# Patient Record
Sex: Female | Born: 1990 | Race: Black or African American | Hispanic: No | Marital: Single | Smoking: Never smoker
Health system: Southern US, Community
[De-identification: ages and names within clinical notes are randomized; demographics above are authoritative.]

## PROBLEM LIST (undated history)

## (undated) DIAGNOSIS — I739 Peripheral vascular disease, unspecified: Secondary | ICD-10-CM

## (undated) DIAGNOSIS — F79 Unspecified intellectual disabilities: Secondary | ICD-10-CM

## (undated) DIAGNOSIS — Z975 Presence of (intrauterine) contraceptive device: Secondary | ICD-10-CM

## (undated) DIAGNOSIS — R569 Unspecified convulsions: Secondary | ICD-10-CM

## (undated) DIAGNOSIS — F39 Unspecified mood [affective] disorder: Secondary | ICD-10-CM

## (undated) DIAGNOSIS — Z993 Dependence on wheelchair: Secondary | ICD-10-CM

## (undated) DIAGNOSIS — N920 Excessive and frequent menstruation with regular cycle: Secondary | ICD-10-CM

## (undated) DIAGNOSIS — G809 Cerebral palsy, unspecified: Secondary | ICD-10-CM

## (undated) DIAGNOSIS — D649 Anemia, unspecified: Secondary | ICD-10-CM

## (undated) DIAGNOSIS — Z7409 Other reduced mobility: Secondary | ICD-10-CM

## (undated) HISTORY — PX: EYE SURGERY: SHX253

## (undated) HISTORY — DX: Anemia, unspecified: D64.9

## (undated) HISTORY — DX: Presence of (intrauterine) contraceptive device: Z97.5

## (undated) HISTORY — PX: LEG SURGERY: SHX1003

---
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
## 2008-05-04 ENCOUNTER — Ambulatory Visit: Admission: RE | Admit: 2008-05-04 | Discharge: 2008-05-04 | Payer: Self-pay | Admitting: Family Medicine

## 2008-05-04 ENCOUNTER — Ambulatory Visit: Payer: Self-pay | Admitting: Surgery

## 2008-08-12 ENCOUNTER — Encounter: Admission: RE | Admit: 2008-08-12 | Discharge: 2008-08-26 | Payer: Self-pay | Admitting: Physician Assistant

## 2008-08-30 ENCOUNTER — Encounter: Admission: RE | Admit: 2008-08-30 | Discharge: 2008-09-16 | Payer: Self-pay | Admitting: Physician Assistant

## 2008-09-23 ENCOUNTER — Encounter (INDEPENDENT_AMBULATORY_CARE_PROVIDER_SITE_OTHER): Payer: Self-pay | Admitting: Family Medicine

## 2008-09-23 ENCOUNTER — Ambulatory Visit: Payer: Self-pay | Admitting: Vascular Surgery

## 2008-09-23 ENCOUNTER — Ambulatory Visit: Admission: RE | Admit: 2008-09-23 | Discharge: 2008-09-23 | Payer: Self-pay | Admitting: Family Medicine

## 2008-12-28 ENCOUNTER — Emergency Department (HOSPITAL_COMMUNITY): Admission: EM | Admit: 2008-12-28 | Discharge: 2008-12-28 | Payer: Self-pay | Admitting: Emergency Medicine

## 2009-03-28 ENCOUNTER — Encounter: Admission: RE | Admit: 2009-03-28 | Discharge: 2009-05-26 | Payer: Self-pay | Admitting: Physician Assistant

## 2010-06-25 ENCOUNTER — Emergency Department (HOSPITAL_COMMUNITY): Admission: EM | Admit: 2010-06-25 | Discharge: 2010-06-25 | Payer: Self-pay | Admitting: Emergency Medicine

## 2010-08-26 ENCOUNTER — Emergency Department (HOSPITAL_COMMUNITY)
Admission: EM | Admit: 2010-08-26 | Discharge: 2010-08-26 | Payer: Self-pay | Source: Home / Self Care | Admitting: Family Medicine

## 2010-10-18 ENCOUNTER — Ambulatory Visit (HOSPITAL_BASED_OUTPATIENT_CLINIC_OR_DEPARTMENT_OTHER)
Admission: RE | Admit: 2010-10-18 | Discharge: 2010-10-18 | Disposition: A | Discharge status: Home / Self Care | Payer: BC Managed Care – PPO | Attending: Oral and Maxillofacial Surgery | Admitting: Oral and Maxillofacial Surgery

## 2010-10-18 DIAGNOSIS — K006 Disturbances in tooth eruption: Secondary | ICD-10-CM | POA: Insufficient documentation

## 2010-10-18 DIAGNOSIS — F70 Mild intellectual disabilities: Secondary | ICD-10-CM | POA: Insufficient documentation

## 2010-10-18 HISTORY — PX: MULTIPLE TOOTH EXTRACTIONS: SHX2053

## 2010-10-18 LAB — POCT I-STAT, CHEM 8
BUN: 6 mg/dL (ref 6–23)
Calcium, Ion: 0.9 mmol/L — ABNORMAL LOW (ref 1.12–1.32)
Creatinine, Ser: 0.6 mg/dL (ref 0.4–1.2)
Glucose, Bld: 91 mg/dL (ref 70–99)
Sodium: 135 mEq/L (ref 135–145)

## 2010-10-24 NOTE — Op Note (Signed)
NAMESHENG, PRITZ           ACCOUNT NO.:  [id number]  MEDICAL RECORD NO.:  [id number]           PATIENT TYPE:  LOCATION:                                 FACILITY:  PHYSICIAN:  Lincoln Brigham, DDSDATE OF BIRTH:  1991/05/01  DATE OF PROCEDURE:  10/18/2010 DATE OF DISCHARGE:                              OPERATIVE REPORT   PREOPERATIVE DIAGNOSIS:  Mild mental retardation, class II malocclusion, nonfunctional tooth #1 and tooth #16, impacted tooth #17 and tooth #32.  POSTOPERATIVE DIAGNOSIS:  Mild mental retardation, class II malocclusion, nonfunctional 1 and 16, impacted #17 and #32.  PROCEDURE:  Extraction of teeth numbers 1, 5, 12, 16, 17, and 32.  INDICATIONS FOR PROCEDURE:  The patient is a 20 year old African American female originally referred to my office by Dr. Joycelyn Schmid who is her orthodontist.  The patient was referred for extraction of #1, 5, 12, 16, 17, and 32 in order to correct the patient's malocclusion. The patient has a severe class II dental malocclusion with significant overjet.  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 The patient also takes clonazepam for nervousness and anxiety.  The patient also takes glycopyrrolate for hypersalivation.  Due to the patient's medical history, it was deemed necessary that the patient be taken to the operating room to remove the aforementioned teeth.  PROCEDURE IN DETAIL:  The patient was seen in the preoperative area. The patient's questions were answered along with parents.  Consent was verified and updated along with the history and physical verified and updated.  Anesthesia at this point took the patient to the operating room where the patient was then nasally intubated and then turned over to myself.  The patient was prepped and draped in the usual  sterile fashion.  A moistened Ray-Tec was then placed as a third screen and then approximately 10.8 mL of 2% lidocaine with 1:100,000 epinephrine were then injected into the patient's upper right and upper left maxilla along with the lower right and lower left mandible.  In addition to the lidocaine, 0.5% Marcaine with 1:200,000 epinephrine was then used to provide long-term anesthesia to the region, right maxilla, left maxilla, right mandible, left mandible, and approximately 7.2 mL which used to anesthetize that region.  At this point, a 15 blade was then used to make a full-thickness mucoperiosteal flap with a distal buccal release in the region of tooth #17 and a drill was then used to remove bone and create a buccal trough.  At this point, dental elevators and a forceps was then used to delivery tooth #17 intact and then the extraction site was irrigated copiously along with curettage and bone file and then area was sutured with a 3-0 chromic gut suture.  This exact same procedure was repeated for tooth #32.  Our attention was then turned to #16 where a 15 blade was then used to make a full-thickness mucoperiosteal incision with a distal release and a dental elevator was then used to elevate and luxate  the tooth out of the socket.  This was repeated for tooth #1 as well.  Finally, teeth numbers 5 and 12 were removed with straight elevator and a forceps.  All roots were intact.  There was minimal hemorrhage.  No sutures were placed at teeth numbers 1, 16, 5, and 12.  Good hemostasis was achieved.  The patient had throat pack removed after copious irrigation.  All counts were correct x2.  The patient was then returned to the care of Anesthesia where the patient was extubated in a stable fashion.  The patient was then taken to the postoperative area in stable condition.  FINDINGS:  Impacted numbers 17 and 32, nonfunctional #1 and 16, class II dental malocclusion requiring extraction of  premolars #5 and #12.  SPECIMENS:  Teeth numbers 1, 5, 12, 16, 17, 32.  These specimens were discarded and not sent to Pathology.          ______________________________ Lincoln Brigham, DDS     CD/MEDQ  D:  10/18/2010  T:  10/19/2010  Job:  [id number]  Electronically Signed by Lincoln Brigham DDS on 10/24/2010 05:43:13 PM

## 2010-10-24 NOTE — Discharge Summary (Signed)
  Nancy [name], Nancy [name]           ACCOUNT NO.:  [id number]  MEDICAL RECORD NO.:  [id number]          PATIENT TYPE:  AMB  LOCATION:  DSC                          FACILITY:  [facility]  PHYSICIAN:  Lincoln Brigham, DDSDATE OF BIRTH:  1991-06-24  DATE OF ADMISSION: DATE OF DISCHARGE:                              DISCHARGE SUMMARY   HISTORY:  The patient is a 20 year old African American female with a medical history of mild mental retardation and dental malocclusion requiring extraction of #1, #5, #12, #16, #17 and #32 for correction of her dental malocclusion.  The patient was taken to the operating room on October 18, 2010, for extraction of the aforementioned teeth, teeth #1, #5, #12, #16, #17 and #32.  The patient did well with surgery and was stable following the procedure.  The patient was discharged home.  FINAL DIAGNOSIS: 1. Mild mental retardation. 2. Class II dental malocclusion, nonfunctional #1 and #16 and impacted     #17 and #32.          ______________________________ Lincoln Brigham, DDS     CD/MEDQ  D:  10/18/2010  T:  10/19/2010  Job:  [id number]  Electronically Signed by Lincoln Brigham DDS on 10/24/2010 05:43:10 PM

## 2010-11-06 LAB — POCT URINALYSIS DIPSTICK
Hgb urine dipstick: NEGATIVE
Nitrite: NEGATIVE
Urobilinogen, UA: 2 mg/dL — ABNORMAL HIGH (ref 0.0–1.0)
pH: 7 (ref 5.0–8.0)

## 2010-11-08 LAB — URINALYSIS, ROUTINE W REFLEX MICROSCOPIC
Glucose, UA: NEGATIVE mg/dL
Hgb urine dipstick: NEGATIVE
Ketones, ur: 15 mg/dL — AB

## 2010-11-08 LAB — GC/CHLAMYDIA PROBE AMP, URINE
Chlamydia, Swab/Urine, PCR: NEGATIVE
GC Probe Amp, Urine: NEGATIVE

## 2010-11-08 LAB — POCT URINALYSIS DIPSTICK
Ketones, ur: 40 mg/dL — AB
Protein, ur: NEGATIVE mg/dL
pH: 6.5 (ref 5.0–8.0)

## 2010-11-08 LAB — URINE CULTURE
Culture  Setup Time: 201110302104
Culture: NO GROWTH

## 2011-01-12 NOTE — H&P (Signed)
NAMEBRIZA, BARK           ACCOUNT NO.:  [id number]   MEDICAL RECORD NO.:  [id number]          PATIENT TYPE:  INP   LOCATION:  3708                         FACILITY:  [facility]   PHYSICIAN:  Ruthe Mannan, M.D.       DATE OF BIRTH:  11/07/1990   DATE OF ADMISSION:  10/02/2006  DATE OF DISCHARGE:                              HISTORY & PHYSICAL   CHIEF COMPLAINT:  Bilateral leg edema.   HISTORY OF PRESENT ILLNESS:  Zannah is a 20 year old African-American  female with a past medical history significant for cerebral palsy,  mental retardation, and questionable mood disorder, who presented to the  ED with a one month history of bilateral leg edema.  Her father reports  the patient was taking Depo-Provera to regulate her menstrual cycle when  she started developing mood swings two months ago.  She was then  switched to Lybrel for oral contraception when she began having right  leg swelling.  X-rays were done, which showed no fracture and Ace  bandage was applied.  Three days later, edema started in the left leg as  well.  She returned to the PCP last week and was told to stop her oral  contraceptive pills and go back to Depo-Provera.  She was then referred  to the gynecologist who started her back on Depo-Provera.  She was also  placed on hydrochlorothiazide.  Swelling persisted until today when she  presented to the gynecologist and who ordered an ultrasound Doppler  which showed DVTs in the common femoral vein bilaterally, common  profunda vein, and popliteal vein.  She had complaints of no shortness  of breath or chest pain.   PAST MEDICAL HISTORY:  1. Cerebral palsy.  2. Mental retardation.  3. Mood disorder, questionable.   PAST SURGICAL HISTORY:  Corrective surgery on leg during childhood.  Eye  surgery.   FAMILY HISTORY:  No family history of DVTs or PEs.  Grandmother and  grandfather had CAD and diabetes.   SOCIAL HISTORY:  The patient lives in mom, 73 year old  brother, and dad  __________ [place].  She does not smoke.   ALLERGIES:  NO KNOWN DRUG ALLERGIES.   MEDICATIONS:  1. Clonazepam 1 tab q.a.m. and half tab nightly.  2. Hydrochlorothiazide.  3. Amdry-C one tab daily.  4. Lybrel one tab daily.   PHYSICAL EXAMINATION:  VITAL SIGNS:  Temperature is 97.2, pulse is  elevated at 147, respiratory rate is 24, blood pressure is 120/81.  Oxygen saturation 100% on room air.  GENERAL:  She is alert, oriented x3, in no acute distress.  RESPIRATORY:  Clear to auscultation bilaterally.  No increased work of  breathing, nontender to palpation.  CARDIOVASCULAR:  She is tachycardic with regular rhythm.  ABDOMEN:  Soft, nontender, with positive bowel sounds.  EXTREMITIES:  Shows 1+ pitting edema in bilateral lower extremities, 2+  pulses.  Feet are cold to touch and are not cyanotic.  She has no  palpable clots and with her contractures it is not possible to check  Homans sign.   REVIEW OF SYSTEMS:  In general showed no fevers or altered mental  status.  RESPIRATORY:  No shortness of breath.  CVS:  No chest pain.  EXTREMITIES:  No cyanosis and no leg pain.   LABS:  Her PTT is 32.  Her PT is 15.3.  Her INR is 1.2.  Her white count  is 9.0.  Her hemoglobin is 13.2 and her hematocrit is 39.2.  Platelets  are elevated 619.  Her sodium is 136.  Her potassium is 3.7.  Her  chloride is 99.  Her CO2 is 26.  Her BUN is 7.  Her creatinine is 0.52.  Her glucose is 114.  Calcium is 9.5, albumin is 3.3.  AST is 20, ALT is  13.  CT angiogram negative for PE.   ASSESSMENT AND PLAN:  A 20 year old with history of cerebral palsy,  mental retardation, admitted for deep venous thrombosis.  1. Deep venous thrombosis likely secondary to oral contraceptives with      sedentary/wheelchair-bound lifestyle.  Will admit for deep venous      thrombosis treatment, Lovenox and Coumadin, 5 mg now then dosed per      pharmacy.  Will also check CT angiogram to rule out  pulmonary      embolus, as the patient is tachycardic, but denies chest pain or      shortness of breath.  Will hold oral contraceptives.  2. Cerebral palsy, stable.  The patient is very functional and active      at home in full activities.  Will continue clonazepam and Amdry-C.  3. FENGI regular diet.  Saline lock IV fluids.  4. Prophylaxis.  The patient on treatment dose of Lovenox.           ______________________________  Ruthe Mannan, M.D.     TA/MEDQ  D:  10/02/2006  T:  10/03/2006  Job:  [id number]

## 2011-01-12 NOTE — Procedures (Signed)
HISTORY:  This is a 20 year old patient who has a history of cerebral palsy  being evaluated for possible seizures. This is a routine EEG. No skull  defects are noted.   MEDICATIONS:  Depakote.   EEG CLASSIFICATION:  Essentially normal awake/technically difficult.   DESCRIPTION:  According to the background rhythms, this recording consists  of a somewhat poorly modulated low  amplitude, 8 to 9 hertz activity that  appears to be reactive to eye opening and closure. As the record progresses,  the EEG study is filled with head movement and muscle artifact that obscures  the background activities frequently. Photic stimulation is performed and  appears to result in a very minimal photic driving response.  Hyperventilation was not performed. At no time during the recording did  there appear to be evidence of actual spike or spike wave discharges or  evidence of focal slowing. EKG monitor shows no evidence of cardiac rhythm  abnormalities with a heart rate of 102.   IMPRESSION:  This is an essentially normal EEG recording in the waking  state. This is a technically difficult study but no clear evidence of  epileptiform discharges are seen at any time.      Marlan Palau, M.D.  Electronically Signed     ZOX:WRUE  D:  04/12/2005 20:41:21  T:  04/13/2005 45:40:98  Job #:  [id number]

## 2011-01-12 NOTE — Discharge Summary (Signed)
Nancy [name], Nancy [name]           ACCOUNT NO.:  [id number]   MEDICAL RECORD NO.:  [id number]          PATIENT TYPE:  INP   LOCATION:  3708                         FACILITY:  [facility]   PHYSICIAN:  Lupita Raider, M.D.   DATE OF BIRTH:  [birth year]   DATE OF ADMISSION:  10/02/2006  DATE OF DISCHARGE:  10/05/2006                               DISCHARGE SUMMARY   DISCHARGE DIAGNOSES:  1. Bilateral deep vein thrombosis secondary to oral contraceptive pill      use and immobility.  2. Resolving tachycardia, likely related to deep vein thromboses.   MEDICATIONS:  1. Coumadin 2.5 mg p.o. for the next 2 days, until she can follow up      with [facility].  2. Lovenox 50 mg subcutaneous injection b.i.d., until INR at goal      between 2 and 3.  3. Klonopin 1 mg p.o. q.a.m. and 0.5 mg p.o. q.h.s.  4. Amdry-C one tablet daily for secretions.   LABORATORY STUDIES:  Cardiac enzymes negative x3. TSH 1.466, T3 2.9, T4  1.14.  INR (on admission) 1.2, INR (at discharge) 1.8.  CBC:  Hemoglobin  12.8, white blood cell count 7.8, platelet count 485.  Iron studies  within normal limits. Ferritin within normal limits.  LFTs within normal  limits.   FOLLOWUP:  The patient is to follow up with [facility]  on Monday, October 07, 2006 at 10:30 a.m. for an INR check.   FOLLOW-UP ISSUES:  1. Goal INR between 2 and 3.  Will need to continue to bridge her      Lovenox until she is therapeutic.  2. Tachycardia.  Likely this is related to her deep vein thromboses,      but she has a strong family history of hyperthyroidism.  Would      follow thyroid studies closely and check back to old records to see      if this is a chronic issue.  3. Birth Control.  The patient is no longer a candidate for hormonal      contraceptive pill methods.   HOSPITAL COURSE:  A 20 year old African-American female with high  functioning CP and MR, who was recently switched from DepoProvera to  oral  contraceptive pills; subsequently developed leg swelling secondary  to bilateral extensive DVTs.  She was admitted for heparin treatment  while bridging to Coumadin, for which she tolerated well.  She was found  to be tachycardic throughout this admission, and this could be related  to the DVTs but could also be her baseline.  She has a strong family  history of hyperthyroidism, but thyroid function tests were normal at  this visit.  That is not to say they are not fluctuating and should be  checked frequently.   There is teaching done regarding Lovenox administration and Coumadin  diet.  The patient and her family were warned that Coumadin is a  category X drug for pregnancy, and she is not to become pregnant under  any circumstances.  They both reassured me that she is not sexually  active.  She, unfortunately now is no  longer a candidate for hormonal  birth control; so a copper IUD or barrier methods are probably her only  options at this point.  She has an appointment in 2 days with [facility] for an INR check and further management of her anticoagulation.   Home health will be set up to come to her house starting on Tuesdays at  12, and be directed per the primary care physician at [facility] as  far as how frequently they would like to have her INR checked.           ______________________________  Lupita Raider, M.D.     KS/MEDQ  D:  10/05/2006  T:  10/05/2006  Job:  [id number]   cc:   Olena Leatherwood [facility]

## 2012-02-18 DIAGNOSIS — Z0000 Encounter for general adult medical examination without abnormal findings: Secondary | ICD-10-CM | POA: Diagnosis not present

## 2012-04-29 DIAGNOSIS — D649 Anemia, unspecified: Secondary | ICD-10-CM | POA: Diagnosis not present

## 2012-06-03 DIAGNOSIS — D649 Anemia, unspecified: Secondary | ICD-10-CM | POA: Diagnosis not present

## 2012-06-03 DIAGNOSIS — N920 Excessive and frequent menstruation with regular cycle: Secondary | ICD-10-CM | POA: Diagnosis not present

## 2012-07-16 DIAGNOSIS — Z3009 Encounter for other general counseling and advice on contraception: Secondary | ICD-10-CM | POA: Diagnosis not present

## 2012-09-26 DIAGNOSIS — I2510 Atherosclerotic heart disease of native coronary artery without angina pectoris: Secondary | ICD-10-CM | POA: Diagnosis not present

## 2012-09-26 DIAGNOSIS — D649 Anemia, unspecified: Secondary | ICD-10-CM | POA: Diagnosis not present

## 2012-10-25 DIAGNOSIS — Z975 Presence of (intrauterine) contraceptive device: Secondary | ICD-10-CM

## 2012-10-25 HISTORY — DX: Presence of (intrauterine) contraceptive device: Z97.5

## 2012-11-03 ENCOUNTER — Encounter (HOSPITAL_COMMUNITY): Payer: Self-pay | Admitting: *Deleted

## 2012-11-06 ENCOUNTER — Encounter (HOSPITAL_COMMUNITY)
Admission: RE | Admit: 2012-11-06 | Discharge: 2012-11-06 | Disposition: A | Discharge status: Home / Self Care | Payer: BC Managed Care – PPO | Source: Ambulatory Visit | Attending: Obstetrics and Gynecology | Admitting: Obstetrics and Gynecology

## 2012-11-06 ENCOUNTER — Encounter (HOSPITAL_COMMUNITY): Payer: Self-pay

## 2012-11-06 HISTORY — DX: Unspecified convulsions: R56.9

## 2012-11-06 LAB — CBC
HCT: 42.7 % (ref 36.0–46.0)
Hemoglobin: 14.1 g/dL (ref 12.0–15.0)
MCHC: 33 g/dL (ref 30.0–36.0)
MCV: 93.4 fL (ref 78.0–100.0)
Platelets: 261 10*3/uL (ref 150–400)
RBC: 4.57 MIL/uL (ref 3.87–5.11)
WBC: 7.4 10*3/uL (ref 4.0–10.5)

## 2012-11-06 LAB — BASIC METABOLIC PANEL
BUN: 7 mg/dL (ref 6–23)
Calcium: 9.7 mg/dL (ref 8.4–10.5)
Creatinine, Ser: 0.4 mg/dL — ABNORMAL LOW (ref 0.50–1.10)
GFR calc Af Amer: >90 mL/min (ref >90)
GFR calc non Af Amer: >90 mL/min (ref >90)

## 2012-11-06 NOTE — Patient Instructions (Addendum)
Your procedure is scheduled on:11/12/12  Enter through the Main Entrance at :7am Pick up desk phone and dial [phone] and inform us of your arrival.  Please call [phone] if you have any problems the morning of surgery.  Remember: Do not eat or drink after midnight:Tuesday   Take these meds the morning of surgery with a sip of water: usual morning meds  DO NOT wear jewelry, eye make-up, lipstick,body lotion, or dark fingernail polish. Do not shave for 48 hours prior to surgery.  If you are to be admitted after surgery, leave suitcase in car until your room has been assigned. Patients discharged on the day of surgery will not be allowed to drive home.

## 2012-11-06 NOTE — Pre-Procedure Instructions (Signed)
Pt confined to wheel chair and unable to stand without assistance. Dr. Malen Gauze called in to estimate pt's wt at 104 lbs.

## 2012-11-07 NOTE — Pre-Procedure Instructions (Addendum)
During PAT appt, pt's caregiver from group home, Ms. Hatti,who was present with patient, told me that parents are aware that surgery consent form needs to be signed by one of the parents. I sent a note to be delivered by Ms. Hatti to pt's father that had my name and office phone number and instructions to call me and make arrangements to come to hospital and sign consent.  As of 1430 on 11/07/12, I have not heard from Mr. Roxan Hockey. I called Ms. Hatti to inform her and she said she would get in touch with Mr. Montee again. I have attempted to phone all available numbers listed for the parents to no avail.

## 2012-11-11 ENCOUNTER — Encounter (HOSPITAL_COMMUNITY): Payer: Self-pay | Admitting: Obstetrics and Gynecology

## 2012-11-11 DIAGNOSIS — Z7409 Other reduced mobility: Secondary | ICD-10-CM

## 2012-11-11 DIAGNOSIS — N920 Excessive and frequent menstruation with regular cycle: Secondary | ICD-10-CM

## 2012-11-11 DIAGNOSIS — Z993 Dependence on wheelchair: Secondary | ICD-10-CM

## 2012-11-11 HISTORY — DX: Excessive and frequent menstruation with regular cycle: N92.0

## 2012-11-11 HISTORY — DX: Other reduced mobility: Z74.09

## 2012-11-11 NOTE — H&P (Signed)
Nancy [name] is an 22 y.o. female G0P0 desires to decrease menses, states has heavy menses, bothered by difficulty caring for herself.  H/O CP, wheelchair bound making exam in office difficult/impossible.  Also MR, mood disorder, h/o B DVT- necessitating progesterone only contraception.  Pt unable to tolerate Depo.    Pertinent Gynecological History: Menses: flow is moderate and regular menses Bleeding: dysfunctional uterine bleeding Contraception: none DES exposure: unknown Blood transfusions: none Sexually transmitted diseases: no past history Previous GYN Procedures: N/A  Last pap: none OB History: G0, P0   Menstrual History:  No LMP recorded.    Past Medical History  Diagnosis Date  . CP (cerebral palsy)     with mild contracture  . MR (mental retardation)   . Mood disorder   . Peripheral vascular disease 10/02/2006    bilateral DVT lower legs  . Wheelchair bound     r/t CP  . Seizures     childhood    Past Surgical History  Procedure Laterality Date  . Multiple tooth extractions  10/18/10  . Leg surgery      as a child  . Eye surgery      as a child    Family History: CVA, DM 2, HTN  Social History:  reports that she has never smoked. She does not have any smokeless tobacco history on file. She reports that she does not drink alcohol or use illicit drugs.single  Allergies: No Known Allergies  No prescriptions prior to admission    Review of Systems  Constitutional: Negative.   HENT: Negative.   Eyes: Negative.   Respiratory: Negative.   Cardiovascular: Negative.   Gastrointestinal: Negative.   Genitourinary: Negative.   Musculoskeletal:       CP, wheelchair bound  Skin: Negative.   Neurological:       CP, wheelchair bound  Psychiatric/Behavioral: Negative.     Height 4\' 11"  (1.499 m), weight 49.896 kg (110 lb). Physical Exam  Constitutional: She is oriented to person, place, and time.  NAD  HENT:  Head: Normocephalic and atraumatic.   Cardiovascular: Normal rate and regular rhythm.   Respiratory: Effort normal and breath sounds normal. No respiratory distress. She has no wheezes.  GI: Soft. Bowel sounds are normal. There is no tenderness.  Musculoskeletal:  CP, wheelchair bound  Neurological: She is alert and oriented to person, place, and time.  Skin: Skin is warm and dry.  Psychiatric: She has a normal mood and affect. Her behavior is normal.    No results found for this or any previous visit (from the past 24 hour(s)).  No results found.  Assessment/Plan: 21yo G0P0  For Mirena IUD placement and EUA with pap.  D/w pt and caregiver r/b/a, wish to proceed.  Also pap.    BOVARD,Icela Glymph 11/11/2012, 9:22 PM

## 2012-11-12 ENCOUNTER — Encounter (HOSPITAL_COMMUNITY): Payer: Self-pay | Admitting: Anesthesiology

## 2012-11-12 ENCOUNTER — Ambulatory Visit (HOSPITAL_COMMUNITY): Payer: BC Managed Care – PPO | Admitting: Anesthesiology

## 2012-11-12 ENCOUNTER — Encounter (HOSPITAL_COMMUNITY)
Admission: RE | Disposition: A | Discharge status: Home Health | Payer: Self-pay | Source: Ambulatory Visit | Attending: Obstetrics and Gynecology

## 2012-11-12 ENCOUNTER — Ambulatory Visit (HOSPITAL_COMMUNITY)
Admission: RE | Admit: 2012-11-12 | Discharge: 2012-11-12 | Disposition: A | Discharge status: Home Health | Payer: BC Managed Care – PPO | Source: Ambulatory Visit | Attending: Obstetrics and Gynecology | Admitting: Obstetrics and Gynecology

## 2012-11-12 DIAGNOSIS — N920 Excessive and frequent menstruation with regular cycle: Secondary | ICD-10-CM | POA: Insufficient documentation

## 2012-11-12 DIAGNOSIS — Z7409 Other reduced mobility: Secondary | ICD-10-CM | POA: Diagnosis present

## 2012-11-12 DIAGNOSIS — Z86718 Personal history of other venous thrombosis and embolism: Secondary | ICD-10-CM | POA: Diagnosis not present

## 2012-11-12 DIAGNOSIS — Z30430 Encounter for insertion of intrauterine contraceptive device: Secondary | ICD-10-CM | POA: Diagnosis not present

## 2012-11-12 DIAGNOSIS — Z993 Dependence on wheelchair: Secondary | ICD-10-CM

## 2012-11-12 HISTORY — DX: Cerebral palsy, unspecified: G80.9

## 2012-11-12 HISTORY — DX: Unspecified intellectual disabilities: F79

## 2012-11-12 HISTORY — PX: INTRAUTERINE DEVICE (IUD) INSERTION: SHX5877

## 2012-11-12 HISTORY — DX: Excessive and frequent menstruation with regular cycle: N92.0

## 2012-11-12 HISTORY — DX: Unspecified mood (affective) disorder: F39

## 2012-11-12 HISTORY — DX: Dependence on wheelchair: Z99.3

## 2012-11-12 HISTORY — DX: Peripheral vascular disease, unspecified: I73.9

## 2012-11-12 HISTORY — DX: Other reduced mobility: Z74.09

## 2012-11-12 LAB — PREGNANCY, URINE: Preg Test, Ur: NEGATIVE

## 2012-11-12 SURGERY — INSERTION, INTRAUTERINE DEVICE
Anesthesia: Monitor Anesthesia Care | Site: Vagina | Wound class: Clean Contaminated

## 2012-11-12 MED ORDER — ONDANSETRON HCL 4 MG/2ML IJ SOLN
INTRAMUSCULAR | Status: DC | PRN
  Administered 2012-11-12: 4 mg via INTRAVENOUS

## 2012-11-12 MED ORDER — PROPOFOL 10 MG/ML IV EMUL
INTRAVENOUS | Status: AC
  Filled 2012-11-12: qty 20

## 2012-11-12 MED ORDER — KETOROLAC TROMETHAMINE 30 MG/ML IJ SOLN
INTRAMUSCULAR | Status: DC | PRN
  Administered 2012-11-12: 30 mg via INTRAVENOUS

## 2012-11-12 MED ORDER — KETOROLAC TROMETHAMINE 30 MG/ML IJ SOLN
INTRAMUSCULAR | Status: AC
  Filled 2012-11-12: qty 1

## 2012-11-12 MED ORDER — LACTATED RINGERS IV SOLN
INTRAVENOUS | Status: DC
  Administered 2012-11-12: 08:00:00 via INTRAVENOUS

## 2012-11-12 MED ORDER — MIDAZOLAM HCL 5 MG/5ML IJ SOLN
INTRAMUSCULAR | Status: DC | PRN
  Administered 2012-11-12 (×2): 1 mg via INTRAVENOUS

## 2012-11-12 MED ORDER — FENTANYL CITRATE 0.05 MG/ML IJ SOLN
INTRAMUSCULAR | Status: AC
  Filled 2012-11-12: qty 2

## 2012-11-12 MED ORDER — LIDOCAINE HCL (CARDIAC) 20 MG/ML IV SOLN
INTRAVENOUS | Status: AC
  Filled 2012-11-12: qty 5

## 2012-11-12 MED ORDER — MEPERIDINE HCL 25 MG/ML IJ SOLN: 6.2500 mg | INTRAMUSCULAR | Status: DC | PRN

## 2012-11-12 MED ORDER — FENTANYL CITRATE 0.05 MG/ML IJ SOLN: 25.0000 ug | INTRAMUSCULAR | Status: DC | PRN

## 2012-11-12 MED ORDER — FENTANYL CITRATE 0.05 MG/ML IJ SOLN
INTRAMUSCULAR | Status: DC | PRN
  Administered 2012-11-12 (×2): 12.5 ug via INTRAVENOUS

## 2012-11-12 MED ORDER — KETOROLAC TROMETHAMINE 30 MG/ML IJ SOLN: 15.0000 mg | Freq: Once | INTRAMUSCULAR | Status: DC | PRN

## 2012-11-12 MED ORDER — PROPOFOL 10 MG/ML IV EMUL
INTRAVENOUS | Status: DC | PRN
  Administered 2012-11-12 (×5): 10 mg via INTRAVENOUS
  Administered 2012-11-12 (×2): 20 mg via INTRAVENOUS
  Administered 2012-11-12 (×5): 10 mg via INTRAVENOUS

## 2012-11-12 MED ORDER — LIDOCAINE HCL (CARDIAC) 20 MG/ML IV SOLN
INTRAVENOUS | Status: DC | PRN
  Administered 2012-11-12 (×2): 20 mg via INTRAVENOUS

## 2012-11-12 MED ORDER — IBUPROFEN 600 MG PO TABS: 600.0000 mg | ORAL_TABLET | Freq: Three times a day (TID) | ORAL | Status: DC | PRN

## 2012-11-12 MED ORDER — LIDOCAINE HCL 1 % IJ SOLN
INTRAMUSCULAR | Status: DC | PRN
  Administered 2012-11-12: 10 mL

## 2012-11-12 MED ORDER — ONDANSETRON HCL 4 MG/2ML IJ SOLN
INTRAMUSCULAR | Status: AC
  Filled 2012-11-12: qty 2

## 2012-11-12 MED ORDER — ONDANSETRON HCL 4 MG/2ML IJ SOLN: 4.0000 mg | Freq: Once | INTRAMUSCULAR | Status: DC | PRN

## 2012-11-12 MED ORDER — LACTATED RINGERS IV SOLN: INTRAVENOUS | Status: DC

## 2012-11-12 MED ORDER — MIDAZOLAM HCL 2 MG/2ML IJ SOLN
INTRAMUSCULAR | Status: AC
  Filled 2012-11-12: qty 2

## 2012-11-12 SURGICAL SUPPLY — 12 items
CATH ROBINSON RED A/P 16FR (CATHETERS) ×2 IMPLANT
CLOTH BEACON ORANGE TIMEOUT ST (SAFETY) ×2 IMPLANT
CONTAINER PREFILL 10% NBF 60ML (FORM) ×4 IMPLANT
GLOVE BIO SURGEON STRL SZ 6.5 (GLOVE) ×2 IMPLANT
GOWN STRL REIN XL XLG (GOWN DISPOSABLE) ×4 IMPLANT
NDL SPNL 22GX3.5 QUINCKE BK (NEEDLE) ×1 IMPLANT
NEEDLE SPNL 22GX3.5 QUINCKE BK (NEEDLE) ×2 IMPLANT
PACK VAGINAL MINOR WOMEN LF (CUSTOM PROCEDURE TRAY) ×2 IMPLANT
PAD PREP 24X48 CUFFED NSTRL (MISCELLANEOUS) ×2 IMPLANT
SYR CONTROL 10ML LL (SYRINGE) ×2 IMPLANT
TOWEL OR 17X24 6PK STRL BLUE (TOWEL DISPOSABLE) ×4 IMPLANT
WATER STERILE IRR 1000ML POUR (IV SOLUTION) ×2 IMPLANT

## 2012-11-12 NOTE — Brief Op Note (Signed)
11/12/2012  9:04 AM  PATIENT:  Nancy [name]  22 y.o. female  PRE-OPERATIVE DIAGNOSIS:  Menorrhagia, Pt. in a wheelchair  POST-OPERATIVE DIAGNOSIS:  Menorrhagia, pt in a wheelchair  PROCEDURE:  Procedure(s): INTRAUTERINE DEVICE (IUD) INSERTION (N/A), EUA, Pap  SURGEON:  Surgeon(s) and Role:    * Jerusalem Brownstein Bovard, MD - Primary  ANESTHESIA:   IV sedation and paracervical block  EBL:  Total I/O In: 800 [I.V.:800] Out: 100 [Urine:100]  BLOOD ADMINISTERED:none  DRAINS: none   LOCAL MEDICATIONS USED:  LIDOCAINE 1%  SPECIMEN:  Source of Specimen:  Pap, UA, Ur Cx  DISPOSITION OF SPECIMEN:  PATHOLOGY  COUNTS:  YES  TOURNIQUET:  * No tourniquets in log *  DICTATION: .Other Dictation: Dictation Number [id number]  PLAN OF CARE: Discharge to home after PACU  PATIENT DISPOSITION:  PACU - hemodynamically stable.   Delay start of Pharmacological VTE agent (>24hrs) due to surgical blood loss or risk of bleeding: not applicable

## 2012-11-12 NOTE — Anesthesia Postprocedure Evaluation (Signed)
  Anesthesia Post-op Note  Patient: Nancy [name]  Procedure(s) Performed: Procedure(s): INTRAUTERINE DEVICE (IUD) INSERTION (N/A)  Patient is awake and responsive. Pain and nausea are reasonably well controlled. Vital signs are stable and clinically acceptable. Oxygen saturation is clinically acceptable. There are no apparent anesthetic complications at this time. Patient is ready for discharge.

## 2012-11-12 NOTE — Transfer of Care (Signed)
Immediate Anesthesia Transfer of Care Note  Patient: Nancy [name]  Procedure(s) Performed: Procedure(s): INTRAUTERINE DEVICE (IUD) INSERTION (N/A)  Patient Location: PACU  Anesthesia Type:MAC  Level of Consciousness: awake, oriented and patient cooperative  Airway & Oxygen Therapy: Patient Spontanous Breathing  Post-op Assessment: Report given to PACU RN  Post vital signs: Reviewed and stable  Complications: No apparent anesthesia complications

## 2012-11-12 NOTE — Anesthesia Preprocedure Evaluation (Signed)
Anesthesia Evaluation  Patient identified by MRN, date of birth, ID band Patient awake    Reviewed: Allergy & Precautions, H&P , Patient's Chart, lab work & pertinent test results  Airway Mallampati: III TM Distance: >3 FB Neck ROM: full    Dental  (+) Teeth Intact and Poor Dentition   Pulmonary neg pulmonary ROS,  breath sounds clear to auscultation  Pulmonary exam normal       Cardiovascular negative cardio ROS      Neuro/Psych Seizures -, Well Controlled,  negative psych ROS   GI/Hepatic negative GI ROS, Neg liver ROS,   Endo/Other  negative endocrine ROS  Renal/GU negative Renal ROS  negative genitourinary   Musculoskeletal negative musculoskeletal ROS (+)   Abdominal Normal abdominal exam  (+)   Peds negative pediatric ROS (+)  Hematology negative hematology ROS (+)   Anesthesia Other Findings   Reproductive/Obstetrics negative OB ROS                           Anesthesia Physical Anesthesia Plan  ASA: II  Anesthesia Plan: MAC   Post-op Pain Management:    Induction: Intravenous  Airway Management Planned:   Additional Equipment:   Intra-op Plan:   Post-operative Plan:   Informed Consent: I have reviewed the patients History and Physical, chart, labs and discussed the procedure including the risks, benefits and alternatives for the proposed anesthesia with the patient or authorized representative who has indicated his/her understanding and acceptance.     Plan Discussed with: CRNA and Surgeon  Anesthesia Plan Comments:         Anesthesia Quick Evaluation

## 2012-11-13 ENCOUNTER — Encounter (HOSPITAL_COMMUNITY): Payer: Self-pay | Admitting: Obstetrics and Gynecology

## 2012-11-13 NOTE — Op Note (Signed)
NAME:  Nancy [name], Nancy [name] NO.:  [id number]  MEDICAL RECORD NO.:  [id number]  LOCATION:  [facility]                          FACILITY:  [facility]  PHYSICIAN:  Sherron Monday, MD        DATE OF BIRTH:  Jul 28, 1991  DATE OF PROCEDURE: DATE OF DISCHARGE:                              OPERATIVE REPORT   PREOPERATIVE DIAGNOSIS:  Menorrhagia, the patient is in a wheelchair, history of DVT, limited mobility, routine GYN care.  POSTOPERATIVE DIAGNOSIS:  Menorrhagia, the patient is in a wheelchair, history of DVT, limited mobility, routine GYN care.  PROCEDURE:  Exam under anesthesia, Pap smear and IUD insertion.  SURGEON:  Sherron Monday, MD  ANESTHESIA:  IV sedation, MAC procedure and a paracervical block.  IV FLUIDS:  800 mL.  URINE OUTPUT:  200 mL.  ESTIMATED BLOOD LOSS:  Minimal, 1% lidocaine 10 mL was used for paracervical block.  PATHOLOGY:  Pap smear.  UA and urine culture sent to the lab.  COMPLICATIONS:  None.  PROCEDURE:  After informed consent was reviewed with the patient and her caregiver including risks, benefits, and alternatives, she was transported to the OR, where MAC anesthesia was placed, found to be adequate.  She was then placed in the [place] stirrups to the best of our ability as she is limited by contractures.  A bimanual exam was performed revealing an anteverted uterus, normal size, adnexa without masses or tenderness.  Her bladder was sterilely drained.  The urine was noted to have a foul odor,was very concentrated so, was sent for UA and urine culture.  A Pap smear was performed, using a Pederson speculum  her cervix was easily identified.  The Pap smear was performed.  Uterus was sounded to approximately 8 cm.  After cleansing the cervix with Betadine and the IUD was placed without difficulties or complications, strings were trimmed.  Speculum was removed from her vagina.  There was noted to be no bleeding. Sponge, lap, and needle counts correct x2  per the operating staff.  The patient tolerated the procedure without any difficulty.     Sherron Monday, MD     JB/MEDQ  D:  11/12/2012  T:  11/12/2012  Job:  [id number]

## 2012-11-14 ENCOUNTER — Encounter: Payer: Self-pay | Admitting: Family Medicine

## 2012-11-17 ENCOUNTER — Telehealth: Payer: Self-pay | Admitting: Family Medicine

## 2012-11-17 MED ORDER — GLYCOPYRROLATE 1 MG PO TABS: 1.0000 mg | ORAL_TABLET | ORAL | Status: DC

## 2012-11-17 NOTE — Telephone Encounter (Signed)
rx refilled.

## 2012-12-11 ENCOUNTER — Ambulatory Visit: Payer: BC Managed Care – PPO | Admitting: Family Medicine

## 2012-12-22 ENCOUNTER — Encounter: Payer: Self-pay | Admitting: Physician Assistant

## 2012-12-22 ENCOUNTER — Ambulatory Visit (INDEPENDENT_AMBULATORY_CARE_PROVIDER_SITE_OTHER): Payer: BC Managed Care – PPO | Admitting: Physician Assistant

## 2012-12-22 VITALS — BP 114/82 | HR 64 | Temp 97.4°F | Resp 18

## 2012-12-22 DIAGNOSIS — N920 Excessive and frequent menstruation with regular cycle: Secondary | ICD-10-CM | POA: Diagnosis not present

## 2012-12-22 DIAGNOSIS — Z7409 Other reduced mobility: Secondary | ICD-10-CM

## 2012-12-22 DIAGNOSIS — R69 Illness, unspecified: Secondary | ICD-10-CM

## 2012-12-22 DIAGNOSIS — D509 Iron deficiency anemia, unspecified: Secondary | ICD-10-CM

## 2012-12-22 DIAGNOSIS — R609 Edema, unspecified: Secondary | ICD-10-CM | POA: Diagnosis not present

## 2012-12-22 DIAGNOSIS — R600 Localized edema: Secondary | ICD-10-CM

## 2012-12-22 NOTE — Progress Notes (Signed)
   Patient ID: Nancy [name] MRN: [id number], DOB: 02-05-1991, 22 y.o. Date of Encounter: 12/22/2012, 4:30 PM    Chief Complaint:  Chief Complaint  Patient presents with  . needs Rx for [facility] lift for use at home     HPI: 22 y.o. year old female here with staff from facility where she stays.   1- Needs Rx for [facility] lift  2- reports LE edema in feet. Says Dr Tanya Nones gave her a fluid pill she took daily for 10 days last month but they report they dont think the swelling ws ever much better even when on that med.   3- I discussed her anemia. She had IUD placed by Gyn. Since then her menses are much lighter. Is taking iron daily. Staff says it does cause constipation but she gives Advice worker.     Home Meds: Current Outpatient Prescriptions on File Prior to Visit  Medication Sig Dispense Refill  . clonazePAM (KLONOPIN) 1 MG tablet Take 1 mg by mouth daily.      . ferrous sulfate 325 (65 FE) MG tablet Take 325 mg by mouth daily with breakfast.      . glycopyrrolate (ROBINUL) 1 MG tablet Take 1 tablet (1 mg total) by mouth 2 (two) times daily in the am and at bedtime..  60 tablet  3  . ibuprofen (MOTRIN IB) 600 MG tablet Take 1 tablet (600 mg total) by mouth every 8 (eight) hours as needed for pain.  40 tablet  1  . furosemide (LASIX) 20 MG tablet Take 20 mg by mouth every other day.      . Vitamin D, Ergocalciferol, (DRISDOL) 50000 UNITS CAPS Take 50,000 Units by mouth every 7 (seven) days.        No current facility-administered medications on file prior to visit.    Allergies: No Known Allergies    Review of Systems: see hpi. All other pertinent ros negative   Physical Exam: Blood pressure 114/82, pulse 64, temperature 97.4 F (36.3 C), temperature source Oral, resp. rate 18, height 0' (0 m), weight 0 lb (0 kg)., Cannot calculate BMI with a height equal to zero. General: AAF with cerebral palsy in wheelchair with right arm contracture. Lungs: Clear bilaterally  to auscultation without wheezes, rales, or rhonchi. Breathing is unlabored. Heart: Regular rhythm. No murmurs, rubs, or gallops. Extremities/Skin: No significant edema at ankles     ASSESSMENT AND PLAN:  22 y.o. year old female with  1. Mobility impaired Rx given for hoyer lift  2. Bilateral lower extremity edema Discussed TED hose stockings. They will try having her lie down with feet propped above heart level. Will not use diuretics since BP low in past and not severe edema.   3. Menorrhagia with regular cycle This is improved since IUD placed.   4. Anemia, iron deficiency She had CBC in EPIC dated 11/06/12. H/H was 14.1/42.7.  She can stop the ferrous sulfate since H/H so high and no longer having heavy bleeding and has nml menstrual blood loss now.    [address] [place], [place], [facility] 12/22/2012 4:30 PM

## 2012-12-23 DIAGNOSIS — G253 Myoclonus: Secondary | ICD-10-CM

## 2012-12-23 DIAGNOSIS — G800 Spastic quadriplegic cerebral palsy: Secondary | ICD-10-CM | POA: Insufficient documentation

## 2012-12-23 DIAGNOSIS — G808 Other cerebral palsy: Secondary | ICD-10-CM

## 2012-12-23 DIAGNOSIS — M4150 Other secondary scoliosis, site unspecified: Secondary | ICD-10-CM | POA: Insufficient documentation

## 2012-12-23 DIAGNOSIS — G40309 Generalized idiopathic epilepsy and epileptic syndromes, not intractable, without status epilepticus: Secondary | ICD-10-CM | POA: Insufficient documentation

## 2012-12-23 DIAGNOSIS — G4700 Insomnia, unspecified: Secondary | ICD-10-CM

## 2012-12-23 DIAGNOSIS — F70 Mild intellectual disabilities: Secondary | ICD-10-CM | POA: Insufficient documentation

## 2012-12-29 ENCOUNTER — Ambulatory Visit (INDEPENDENT_AMBULATORY_CARE_PROVIDER_SITE_OTHER): Payer: BC Managed Care – PPO | Admitting: Pediatrics

## 2012-12-29 ENCOUNTER — Encounter: Payer: Self-pay | Admitting: Pediatrics

## 2012-12-29 VITALS — BP 100/70 | HR 72

## 2012-12-29 DIAGNOSIS — F70 Mild intellectual disabilities: Secondary | ICD-10-CM

## 2012-12-29 DIAGNOSIS — E559 Vitamin D deficiency, unspecified: Secondary | ICD-10-CM

## 2012-12-29 DIAGNOSIS — K117 Disturbances of salivary secretion: Secondary | ICD-10-CM

## 2012-12-29 DIAGNOSIS — R69 Illness, unspecified: Secondary | ICD-10-CM | POA: Diagnosis not present

## 2012-12-29 DIAGNOSIS — M4150 Other secondary scoliosis, site unspecified: Secondary | ICD-10-CM

## 2012-12-29 DIAGNOSIS — G808 Other cerebral palsy: Secondary | ICD-10-CM | POA: Diagnosis not present

## 2012-12-29 DIAGNOSIS — Z7409 Other reduced mobility: Secondary | ICD-10-CM

## 2012-12-29 DIAGNOSIS — G253 Myoclonus: Secondary | ICD-10-CM | POA: Diagnosis not present

## 2012-12-29 DIAGNOSIS — G40309 Generalized idiopathic epilepsy and epileptic syndromes, not intractable, without status epilepticus: Secondary | ICD-10-CM

## 2012-12-29 MED ORDER — CLONAZEPAM 1 MG PO TABS: 1.0000 mg | ORAL_TABLET | Freq: Every day | ORAL | Status: DC

## 2012-12-29 MED ORDER — GLYCOPYRROLATE 1 MG PO TABS: 1.0000 mg | ORAL_TABLET | Freq: Two times a day (BID) | ORAL | Status: DC

## 2012-12-29 NOTE — Patient Instructions (Signed)
You looked well today.  Continue to take her medications as ordered.  We will see you in a year.

## 2012-12-29 NOTE — Progress Notes (Signed)
Patient: Nancy [name] MRN: [id number] Sex: female DOB: 02/25/1991  Provider: Deetta Perla, MD Location of Care: [facility]  Note type: Routine return visit  History of Present Illness: Referral Source: [facility] History from: group home supervisor, patient and [facility] chart Chief Complaint: Epilepsy  Nancy [name] is a 22 y.o. female referred for evaluation of seizures, quadriparesis, cognitive impairments.  Nancy [name] returns today for the first time since June 06, 2011.  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  She was extremely premature infant born [redacted] weeks gestational age.  She is wheelchair bound.  She is dependent on other for activities of daily living other than feeding and typing on a computer.  She had seizures in the past, but has had none for nine years.  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  She returns today in followup for the first time in 18 months.  She is in a day program at [facility].  She was in a group home and was here today with her supervisor.  They have many activities that are community based, some work activity others recreation.  She is Teacher, English as a foreign language, healthy living.  Her group home is located in the [place].  There are three clients and up to six adults supervising them.  Despite her numerous neurologic problems, she has been physically healthy.  She described a number of concerns including weight gain, swelling in her legs, constipation, increased thirst, change in appetite, depression, allergies, and snoring.  She apparently does not have apnea.  She has taken and tolerated her medications, which include Robinul and clonazepam.  She is also on large doses of vitamin D every week.  She takes furosemide only as needed.  The same is true for  ibuprofen.  Review of Systems: 12 system review was remarkable for weight gain, swelling in legs, snoring, constipation, increased thirst, allergies, depression and change in appetite.  Past Medical History  Diagnosis Date  . CP (cerebral palsy)     with mild contracture  . MR (mental retardation)   . Mood disorder   . Peripheral vascular disease 10/02/2006    bilateral DVT lower legs  . Wheelchair bound     r/t CP  . Seizures     childhood  . Menorrhagia with regular cycle 11/11/2012  . Mobility impaired 11/11/2012  . IUD (intrauterine device) in place 10/2012   Hospitalizations: yes, Head Injury: no, Nervous System Infections: no, Immunizations up to date: yes Past Medical History Comments: The patient was in a [place] brace for a long time because of her scoliosis but was not able to tolerate it and has progressed.  She had deep vein thrombosis in February, 2008 which required Coumadin for some time.  Birth History [redacted] weeks gestational age Gestation was complicated by pre-eclampsia 9 week NNICU stay Growth and Development was recalled as  abnormal  Behavior History none  Surgical History Past Surgical History  Procedure Laterality Date  . Multiple tooth extractions  10/18/10  . Leg surgery      as a child  . Eye surgery      as a child  . Intrauterine device (iud) insertion N/A 11/12/2012    Procedure: INTRAUTERINE DEVICE (IUD) INSERTION;  Surgeon: Sherron Monday, MD;  Location: [facility];  Service: Gynecology;  Laterality: N/A;  . Hip surgery Right 2003  Surgical History Comments: Dental surgeries, eye surgery 2007-8 and surgery on both legs.  Family History family history includes Alzheimer's disease in her maternal grandfather; Cancer in her mother; Diabetes in her father; and Heart attack in her paternal grandmother. Family History is negative migraines, seizures, cognitive impairment, blindness, deafness, birth defects, chromosomal disorder, autism.  Social  History History   Social History  . Marital Status: Single    Spouse Name: N/A    Number of Children: N/A  . Years of Education: N/A   Social History Main Topics  . Smoking status: Never Smoker   . Smokeless tobacco: Never Used  . Alcohol Use: No  . Drug Use: No  . Sexually Active: None   Other Topics Concern  . None   Social History Narrative  . None   Educational level Graduate with a certificate   Patient lives in a group home.  Hobbies/Interest: Internet  Current Outpatient Prescriptions on File Prior to Visit  Medication Sig Dispense Refill  . furosemide (LASIX) 20 MG tablet Take 20 mg by mouth as needed.       Marland Kitchen ibuprofen (MOTRIN IB) 600 MG tablet Take 1 tablet (600 mg total) by mouth every 8 (eight) hours as needed for pain.  40 tablet  1  . Vitamin D, Ergocalciferol, (DRISDOL) 50000 UNITS CAPS Take 50,000 Units by mouth every 7 (seven) days.        No current facility-administered medications on file prior to visit.   The medication list was reviewed and reconciled. All changes or newly prescribed medications were explained.  A complete medication list was provided to the patient/caregiver.  No Known Allergies  Physical Exam BP 100/70  Pulse 72  General: alert, well developed, well nourished, in no acute distress;  left-handed,  thin habitus, flexion contractures; sitting in her chair semirecumbent because of her spasticity Head: normocephalic, no dysmorphic features Ears, Nose and Throat: Otoscopic: tympanic membranes can't be seen because of wax .  Pharynx: oropharynx is pink without exudates or tonsillar hypertrophy. she has braces on her teeth Neck: supple, full range of motion, no cranial or cervical bruits Respiratory: auscultation clear Cardiovascular: no murmurs, pulses are normal Musculoskeletal: severe neuromuscular scoliosis, contractures at her hips, left greater than right in flexion, extension, and abduction Skin: no rashes or neurocutaneous  lesions Trunk:  Right convex scoliosis lumbar region; flexion contractures her hips, knees, ankles, elbows, wrists, shoulders, some clawhand deformity on the right; The left arm is somewhat larger than the right.  Neurologic Exam  Mental Status: alert; oriented to person, place; knowledge is below normal for age; language is normal Cranial Nerves: visual fields are full to double simultaneous stimuli; extraocular movements are full and conjugate; pupils are round reactive to light; funduscopic examination shows sharp disc margins with normal vessels; symmetric facial strength; midline tongue and uvula; air conduction is greater than bone conduction bilaterally. the patient is dysarthric but intelligible.  Her head is positioned year down to the left chin to the right Motor: 4 over 5 in the right upper extremity was clumsy fine motor movements, 4+ over 5 in the left upper extremity showed fairly normal fine motor movements.   With the right hand she cannot move her thumb past her middle finger; on the left hand she can touch her ring finger. She has limited movement of her legs. 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  She has greater mobility with the right leg than the left but  both are weak; The patient has less dystonia on the left. Sensory:  withdrawal x4, preserved stereoagnosis on the left  Coordination:  cannot test Gait and Station: wheelchair-bound Reflexes:  diminished deep tendon reflexes except for bilateral ankle clonus right greater than left,  Bilateral extensor plantar responses  Assessment and Plan  1. 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 2. 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 3. 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 5. Drooling (527.7). 6. Unspecified vitamin D deficiency (268.9). 7. Mobility impaired (799.89). 8. Myoclonus history (333.2).   Plan: 1. Continue clonazepam and  glycopyrrolate. 2. Return visit in one year's time for followup. 3. Nancy [name] depending upon clinical need.    Nancy spent 30 minutes of face-to-face time with the patient, more than half of it in consultation.  Deetta Perla MD

## 2013-01-22 ENCOUNTER — Encounter: Payer: Self-pay | Admitting: Physician Assistant

## 2013-01-22 ENCOUNTER — Ambulatory Visit (INDEPENDENT_AMBULATORY_CARE_PROVIDER_SITE_OTHER): Payer: BC Managed Care – PPO | Admitting: Physician Assistant

## 2013-01-22 VITALS — BP 122/88 | HR 80 | Temp 98.7°F | Resp 20

## 2013-01-22 DIAGNOSIS — I82409 Acute embolism and thrombosis of unspecified deep veins of unspecified lower extremity: Secondary | ICD-10-CM

## 2013-01-22 DIAGNOSIS — I82403 Acute embolism and thrombosis of unspecified deep veins of lower extremity, bilateral: Secondary | ICD-10-CM

## 2013-01-22 DIAGNOSIS — B360 Pityriasis versicolor: Secondary | ICD-10-CM | POA: Diagnosis not present

## 2013-01-22 MED ORDER — KETOCONAZOLE 1 % EX SHAM: 10.0000 mL | MEDICATED_SHAMPOO | Freq: Every day | CUTANEOUS | Status: DC

## 2013-01-22 MED ORDER — KETOCONAZOLE 2 % EX SHAM: MEDICATED_SHAMPOO | Freq: Every day | CUTANEOUS | Status: DC

## 2013-01-22 NOTE — Addendum Note (Signed)
Addended by: Donne Anon on: 01/22/2013 12:35 PM   Modules accepted: Orders

## 2013-01-22 NOTE — Progress Notes (Signed)
   Patient ID: SHILOH SOUTHERN MRN: [id number], DOB: 1991/06/29, 22 y.o. Date of Encounter: 01/22/2013, 11:18 AM    Chief Complaint:  Chief Complaint  Patient presents with  . Rash    stomach and thighs 1 mth  . due for periodic venous dopplers of legs  hx dvt     HPI: 22 y.o. year old female here to evaluate rash. Also due for routine dopplers to f/u given h/o DVTs. Has no swelling in one leg (chronic bilateral edema sec to venous insufficiency/immobility). No pain in one leg. Has IUD (inserted by Gyn). No longer on BCP--given immobility and h/o DVT. States that the rash is not itchy at all.     Home Meds: See attached medication section for any medications that were entered at today's visit. The computer does not put those onto this list.The following list is a list of meds entered prior to today's visit.   Current Outpatient Prescriptions on File Prior to Visit  Medication Sig Dispense Refill  . clonazePAM (KLONOPIN) 1 MG tablet Take 1 tablet (1 mg total) by mouth daily.  31 tablet  5  . glycopyrrolate (ROBINUL) 1 MG tablet Take 1 tablet (1 mg total) by mouth 2 (two) times daily.  62 tablet  5  . ibuprofen (MOTRIN IB) 600 MG tablet Take 1 tablet (600 mg total) by mouth every 8 (eight) hours as needed for pain.  40 tablet  1  . Vitamin D, Ergocalciferol, (DRISDOL) 50000 UNITS CAPS Take 50,000 Units by mouth every 7 (seven) days.       . furosemide (LASIX) 20 MG tablet Take 20 mg by mouth as needed.        No current facility-administered medications on file prior to visit.    Allergies: No Known Allergies    Review of Systems: See HPI for pertinent ROS. All other ROS negative.    Physical Exam: Blood pressure 122/88, pulse 80, temperature 98.7 F (37.1 C), temperature source Oral, resp. rate 20, height 0' (0 m), weight 0 lb (0 kg)., Cannot calculate BMI with a height equal to zero. General:AAF in wheelchair.   Appears in no acute distress.  Lungs: Clear bilaterally to  auscultation without wheezes, rales, or rhonchi. Breathing is unlabored. Heart: Regular rhythm. No murmurs, rubs, or gallops. Skin: Lower half of abdomen, extending down to pubic region: Macular rash (not raised at all) areas of hypopigmentation in lacy/reticulated pattern.      ASSESSMENT AND PLAN:  22 y.o. year old female with  1. Tinea versicolor - KETOCONAZOLE, TOPICAL, 1 % SHAM; Apply 10 mLs topically daily.  Dispense: 200 mL; Refill: 3 Apply to affected area. After 5 minutes, rinse.  2. DVT (deep venous thrombosis), bilateral H/O Bilateral DVTS (Felt to be Secondary to Immobility and OCT in past) Now has Mirena IUD instead. Was on Coumadin. Off Coumadin now.  Due for routine f/u U/S.  - US Venous Img Lower Bilateral; Future   Signed, [address] [place], [place], [facility] 01/22/2013 11:18 AM

## 2013-01-27 ENCOUNTER — Other Ambulatory Visit: Payer: BC Managed Care – PPO

## 2013-02-24 ENCOUNTER — Ambulatory Visit: Payer: Medicare Other | Admitting: Family Medicine

## 2013-02-25 ENCOUNTER — Encounter: Payer: Self-pay | Admitting: Physician Assistant

## 2013-02-25 ENCOUNTER — Ambulatory Visit (INDEPENDENT_AMBULATORY_CARE_PROVIDER_SITE_OTHER): Payer: BC Managed Care – PPO | Admitting: Physician Assistant

## 2013-02-25 VITALS — BP 110/68 | HR 82 | Temp 98.4°F | Resp 20

## 2013-02-25 DIAGNOSIS — K5900 Constipation, unspecified: Secondary | ICD-10-CM | POA: Diagnosis not present

## 2013-02-25 DIAGNOSIS — Z993 Dependence on wheelchair: Secondary | ICD-10-CM | POA: Diagnosis not present

## 2013-02-25 NOTE — Progress Notes (Signed)
   Patient ID: Nancy [name] MRN: [id number], DOB: September 30, 1990, 22 y.o. Date of Encounter: 02/25/2013, 10:56 AM    Chief Complaint:  Chief Complaint  Patient presents with  . Constipation     HPI: 22 y.o. year old female with cerebral palsy who is wheelchair bound presents for evaluation of constipation. They report that she had problems with this in past so caregiver used to give her 4-5 prunes per day but then "that was too much and she was going to bathroom too much" so they changed to giving these prunes 2-3 days per week. Her BMs were "normal" with this until about 2 weeks ago. About 2 weeks ago she started having constipation, so they started giving prunes daily. Also, prune juice daily. And, "Fiber in water" (?benefiber?). Still no BM so they gave enema Friday (5 days ago). Had large BM after that. "now stopped up again." On no new meds, new vitamins etc to cause change.      Home Meds: See attached medication section for any medications that were entered at today's visit. The computer does not put those onto this list.The following list is a list of meds entered prior to today's visit.   Current Outpatient Prescriptions on File Prior to Visit  Medication Sig Dispense Refill  . clonazePAM (KLONOPIN) 1 MG tablet Take 1 tablet (1 mg total) by mouth daily.  31 tablet  5  . furosemide (LASIX) 20 MG tablet Take 20 mg by mouth as needed.       Marland Kitchen glycopyrrolate (ROBINUL) 1 MG tablet Take 1 tablet (1 mg total) by mouth 2 (two) times daily.  62 tablet  5  . ibuprofen (MOTRIN IB) 600 MG tablet Take 1 tablet (600 mg total) by mouth every 8 (eight) hours as needed for pain.  40 tablet  1  . ketoconazole (NIZORAL) 2 % shampoo Apply topically daily. apply54ml daily  120 mL  0  . Vitamin D, Ergocalciferol, (DRISDOL) 50000 UNITS CAPS Take 50,000 Units by mouth every 7 (seven) days.        No current facility-administered medications on file prior to visit.    Allergies: No Known Allergies     Review of Systems: See HPI for pertinent ROS. All other ROS negative.    Physical Exam: Blood pressure 110/68, pulse 82, temperature 98.4 F (36.9 C), temperature source Oral, resp. rate 20., There is no weight on file to calculate BMI. General: AAF. In WheelChair. Cerebral Palsy. Appears in no acute distress. Lungs: Clear bilaterally to auscultation without wheezes, rales, or rhonchi. Breathing is unlabored. Heart: Regular rhythm. No murmurs, rubs, or gallops. Abdomen: Soft, non-tender, non-distended with normoactive bowel sounds. No hepatomegaly. No rebound/guarding. No obvious abdominal masses. Msk:  Cerebral palsy      ASSESSMENT AND PLAN:  22 y.o. year old female with  1. Wheelchair bound 2. Cerebral Palsy. 3. Constipation-related to #1,2 above. Start Mirilax. Can use 2 cap fuls at a time. Can repeat every 4 hours until "cleans out."  Then, use daily-start with one capful per day, adjust amount according to her BMs.  Force fluid intake. Even when not thirsty, force fluid intake.  Will schedule f/u in 3 weeks. Depending on results with Mirialx, etc may consider change to Linzess. (?insurance coverage?)   Signed, Shon [address] [place], [place], [facility] 02/25/2013 10:56 AM

## 2013-04-21 ENCOUNTER — Other Ambulatory Visit: Payer: Self-pay | Admitting: Family Medicine

## 2013-04-21 NOTE — Telephone Encounter (Signed)
Medication refilled per protocol. 

## 2013-05-05 ENCOUNTER — Ambulatory Visit (INDEPENDENT_AMBULATORY_CARE_PROVIDER_SITE_OTHER): Payer: Medicare Other | Admitting: Family Medicine

## 2013-05-05 ENCOUNTER — Encounter: Payer: Self-pay | Admitting: Family Medicine

## 2013-05-05 VITALS — BP 100/62 | HR 78 | Temp 98.4°F | Resp 18

## 2013-05-05 DIAGNOSIS — D649 Anemia, unspecified: Secondary | ICD-10-CM | POA: Insufficient documentation

## 2013-05-05 DIAGNOSIS — E559 Vitamin D deficiency, unspecified: Secondary | ICD-10-CM | POA: Diagnosis not present

## 2013-05-05 DIAGNOSIS — Z23 Encounter for immunization: Secondary | ICD-10-CM

## 2013-05-05 DIAGNOSIS — Z0000 Encounter for general adult medical examination without abnormal findings: Secondary | ICD-10-CM | POA: Diagnosis not present

## 2013-05-05 LAB — CBC WITH DIFFERENTIAL/PLATELET
Basophils Absolute: 0 10*3/uL (ref 0.0–0.1)
Eosinophils Relative: 1 % (ref 0–5)
Lymphocytes Relative: 35 % (ref 12–46)
MCV: 92.4 fL (ref 78.0–100.0)
Neutro Abs: 3.8 10*3/uL (ref 1.7–7.7)
Platelets: 333 10*3/uL (ref 150–400)
RDW: 13.5 % (ref 11.5–15.5)
WBC: 6.9 10*3/uL (ref 4.0–10.5)

## 2013-05-05 LAB — COMPLETE METABOLIC PANEL WITH GFR
ALT: 11 U/L (ref 0–35)
AST: 14 U/L (ref 0–37)
Alkaline Phosphatase: 56 U/L (ref 39–117)
Calcium: 9.9 mg/dL (ref 8.4–10.5)
Chloride: 105 mEq/L (ref 96–112)
Creat: 0.42 mg/dL — ABNORMAL LOW (ref 0.50–1.10)

## 2013-05-05 NOTE — Progress Notes (Signed)
Subjective:    Patient ID: Nancy [name], female    DOB: [birth year], 22 y.o.   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 She also has severe scoliosis. She has a history of epilepsy although her last seizure was several years ago. She also has a history of iron deficiency anemia secondary to menorrhagia. Last year she had a pelvic exam done under general anesthesia which was normal and she had an IUD placed. Since placing the IUD her periods have become very infrequent and mild.  The patient has no concerns nor does her caregiver. She has occasional constipation which is treated successfully with MiraLax. Past Medical History  Diagnosis Date  . CP (cerebral palsy)     with mild contracture  . MR (mental retardation)   . Mood disorder   . Peripheral vascular disease 10/02/2006    bilateral DVT lower legs  . Wheelchair bound     r/t CP  . Seizures     childhood  . Menorrhagia with regular cycle 11/11/2012  . Mobility impaired 11/11/2012  . IUD (intrauterine device) in place 10/2012  . Anemia     h/o iron deficiency   Past Surgical History  Procedure Laterality Date  . Multiple tooth extractions  10/18/10  . Leg surgery      as a child  . Eye surgery      as a child  . Intrauterine device (iud) insertion N/A 11/12/2012    Procedure: INTRAUTERINE DEVICE (IUD) INSERTION;  Surgeon: Sherron Monday, MD;  Location: [facility];  Service: Gynecology;  Laterality: N/A;  . Hip surgery Right 2003   Current Outpatient Prescriptions on File Prior to Visit  Medication Sig Dispense Refill  . clonazePAM (KLONOPIN) 1 MG tablet Take 1 tablet (1 mg total) by mouth daily.  31 tablet  5  . glycopyrrolate (ROBINUL) 1 MG tablet Take 1 tablet (1 mg total) by mouth 2 (two) times daily.  62 tablet  5  . ketoconazole (NIZORAL) 2 % shampoo Apply topically daily. apply46ml daily  120 mL  0  .  Vitamin D, Ergocalciferol, (DRISDOL) 50000 UNITS CAPS capsule TAKE ONE CAPSULE BY MOUTH ON THE SAME DAY ONCE A WEEK.  4 capsule  11   No current facility-administered medications on file prior to visit.   No Known Allergies History   Social History  . Marital Status: Single    Spouse Name: N/A    Number of Children: N/A  . Years of Education: N/A   Occupational History  . Not on file.   Social History Main Topics  . Smoking status: Never Smoker   . Smokeless tobacco: Never Used  . Alcohol Use: No  . Drug Use: No  . Sexual Activity: No     Comment: lives in a group home.  single.   Other Topics Concern  . Not on file   Social History Narrative  . No narrative on file   Family History  Problem Relation Age of Onset  . Cancer Mother   . Diabetes Father   . Alzheimer's disease Maternal Grandfather   . Heart attack Paternal Grandmother     Died between 92 or 30 years old     Review of Systems  All other systems reviewed and are negative.       Objective:   Physical Exam  Vitals reviewed. Constitutional: She is oriented to person,  place, and time. She appears well-developed and well-nourished. No distress.  HENT:  Head: Normocephalic and atraumatic.  Right Ear: External ear normal.  Left Ear: External ear normal.  Nose: Nose normal.  Mouth/Throat: Oropharynx is clear and moist. No oropharyngeal exudate.  Eyes: Conjunctivae and EOM are normal. Pupils are equal, round, and reactive to light. Right eye exhibits no discharge. Left eye exhibits no discharge. No scleral icterus.  Neck: Neck supple. No tracheal deviation present. No thyromegaly present.  Cardiovascular: Normal rate, regular rhythm, normal heart sounds and intact distal pulses.  Exam reveals no gallop and no friction rub.   No murmur heard. Pulmonary/Chest: Effort normal and breath sounds normal. No stridor. No respiratory distress. She has no wheezes. She has no rales. She exhibits no tenderness.   Abdominal: Soft. Bowel sounds are normal. She exhibits no distension. There is no tenderness. There is no rebound and no guarding.  Musculoskeletal: She exhibits tenderness. She exhibits no edema.       Lumbar back: She exhibits decreased range of motion, tenderness, deformity and spasm (severe levoscoliosis).  Lymphadenopathy:    She has no cervical adenopathy.  Neurological: She is alert and oriented to person, place, and time. She displays abnormal reflex. No cranial nerve deficit. She exhibits abnormal muscle tone. Coordination abnormal.  Skin: Skin is warm. No rash noted. She is not diaphoretic. No erythema. No pallor.  Psychiatric: She has a normal mood and affect. Her behavior is normal. Judgment and thought content normal.   patient is wheelchair bound with severe flexor contractures in the upper and lower extremities and spasticity.  She demonstrates hyperreflexia at three over four at the biceps, brachioradialis, and patella.  There is no evidence of decubitus ulcers on her back or bottom.         Assessment & Plan:  1. Routine general medical examination at a health care facility Aside from the cerebral palsy and congenital quadriplegia, her physical exam is normal. I will obtain baseline laboratory values including a CBC to monitor her anemia, CMP and a TSH. I imagine her anemia has resolved given the improvement in her menorrhagia after the IUD.  She has eaten this morning and therefore I am not able to check her cholesterol - CBC with Differential - COMPLETE METABOLIC PANEL WITH GFR - TSH  2. Unspecified vitamin D deficiency She does have a history of vitamin D deficiency due to ethnicity as well is being sedentary inside and poor diet. Therefore I will check a vitamin D level. - Vit D  25 hydroxy (rtn osteoporosis monitoring)  3. Need for prophylactic vaccination and inoculation against influenza Patient was given a flu shot today and her FL2 form was completed. - Flu  Vaccine QUAD 36+ mos IM

## 2013-05-06 LAB — VITAMIN D 25 HYDROXY (VIT D DEFICIENCY, FRACTURES): Vit D, 25-Hydroxy: 54 ng/mL (ref 30–89)

## 2013-05-08 ENCOUNTER — Telehealth: Payer: Self-pay | Admitting: Family Medicine

## 2013-05-08 ENCOUNTER — Encounter: Payer: Self-pay | Admitting: Family Medicine

## 2013-05-08 NOTE — Telephone Encounter (Signed)
Spoke to mother with lab results.  Also called pharmacy to have Vit D discontinued.

## 2013-05-08 NOTE — Telephone Encounter (Signed)
Message copied by Donne Anon on Fri May 08, 2013  3:04 PM ------      Message from: Lynnea Ferrier      Created: Thu May 07, 2013  7:49 AM       Labs are normal.  Vit D is normal.  She can stop high dose vit d. ------

## 2013-05-29 ENCOUNTER — Emergency Department (HOSPITAL_COMMUNITY)
Admission: EM | Admit: 2013-05-29 | Discharge: 2013-05-29 | Disposition: A | Discharge status: Home / Self Care | Payer: Medicare Other | Attending: Emergency Medicine | Admitting: Emergency Medicine

## 2013-05-29 ENCOUNTER — Encounter (HOSPITAL_COMMUNITY): Payer: Self-pay | Admitting: Emergency Medicine

## 2013-05-29 ENCOUNTER — Emergency Department (HOSPITAL_COMMUNITY): Payer: Medicare Other

## 2013-05-29 DIAGNOSIS — S0990XA Unspecified injury of head, initial encounter: Secondary | ICD-10-CM | POA: Insufficient documentation

## 2013-05-29 DIAGNOSIS — Y929 Unspecified place or not applicable: Secondary | ICD-10-CM | POA: Insufficient documentation

## 2013-05-29 DIAGNOSIS — Z79899 Other long term (current) drug therapy: Secondary | ICD-10-CM | POA: Diagnosis not present

## 2013-05-29 DIAGNOSIS — Y939 Activity, unspecified: Secondary | ICD-10-CM | POA: Diagnosis not present

## 2013-05-29 DIAGNOSIS — M25579 Pain in unspecified ankle and joints of unspecified foot: Secondary | ICD-10-CM | POA: Diagnosis not present

## 2013-05-29 DIAGNOSIS — T1490XA Injury, unspecified, initial encounter: Secondary | ICD-10-CM | POA: Diagnosis not present

## 2013-05-29 DIAGNOSIS — Z8659 Personal history of other mental and behavioral disorders: Secondary | ICD-10-CM | POA: Insufficient documentation

## 2013-05-29 DIAGNOSIS — Z862 Personal history of diseases of the blood and blood-forming organs and certain disorders involving the immune mechanism: Secondary | ICD-10-CM | POA: Insufficient documentation

## 2013-05-29 DIAGNOSIS — W050XXA Fall from non-moving wheelchair, initial encounter: Secondary | ICD-10-CM | POA: Diagnosis not present

## 2013-05-29 DIAGNOSIS — S8990XA Unspecified injury of unspecified lower leg, initial encounter: Secondary | ICD-10-CM | POA: Insufficient documentation

## 2013-05-29 DIAGNOSIS — Z8679 Personal history of other diseases of the circulatory system: Secondary | ICD-10-CM | POA: Diagnosis not present

## 2013-05-29 DIAGNOSIS — G40909 Epilepsy, unspecified, not intractable, without status epilepticus: Secondary | ICD-10-CM | POA: Insufficient documentation

## 2013-05-29 DIAGNOSIS — Z8669 Personal history of other diseases of the nervous system and sense organs: Secondary | ICD-10-CM | POA: Insufficient documentation

## 2013-05-29 DIAGNOSIS — Z8742 Personal history of other diseases of the female genital tract: Secondary | ICD-10-CM | POA: Insufficient documentation

## 2013-05-29 MED ORDER — ACETAMINOPHEN 325 MG PO TABS
650.0000 mg | ORAL_TABLET | Freq: Once | ORAL | Status: AC
  Administered 2013-05-29: 650 mg via ORAL
  Filled 2013-05-29: qty 2

## 2013-05-29 NOTE — ED Notes (Signed)
Bed: WA11 Expected date:  Expected time:  Means of arrival:  Comments: EMS 

## 2013-05-29 NOTE — ED Notes (Signed)
Patient fell from her wheelchair.  Patient c/o head pain. No LOC or obvious lacerations or bumps or bruises.  Patient is alert and oriented.  She has history of cerebral palsy.

## 2013-05-29 NOTE — ED Provider Notes (Addendum)
CSN: [id number]     Arrival date & time 05/29/13  4098 History   First MD Initiated Contact with Patient 05/29/13 1014     Chief Complaint  Patient presents with  . Fall   (Consider location/radiation/quality/duration/timing/severity/associated sxs/prior Treatment) Patient is a 22 y.o. female presenting with fall. The history is provided by the patient and a caregiver.  Fall This is a new problem. The current episode started less than 1 hour ago. Episode frequency: once. The problem has been resolved. Pertinent negatives include no chest pain, no abdominal pain, no headaches and no shortness of breath. Nothing aggravates the symptoms. Nothing relieves the symptoms. She has tried nothing for the symptoms. The treatment provided no relief.    Past Medical History  Diagnosis Date  . CP (cerebral palsy)     with mild contracture  . MR (mental retardation)   . Mood disorder   . Peripheral vascular disease 10/02/2006    bilateral DVT lower legs  . Wheelchair bound     r/t CP  . Seizures     childhood  . Menorrhagia with regular cycle 11/11/2012  . Mobility impaired 11/11/2012  . IUD (intrauterine device) in place 10/2012  . Anemia     h/o iron deficiency   Past Surgical History  Procedure Laterality Date  . Multiple tooth extractions  10/18/10  . Leg surgery      as a child  . Eye surgery      as a child  . Intrauterine device (iud) insertion N/A 11/12/2012    Procedure: INTRAUTERINE DEVICE (IUD) INSERTION;  Surgeon: Sherron Monday, MD;  Location: [facility];  Service: Gynecology;  Laterality: N/A;  . Hip surgery Right 2003   Family History  Problem Relation Age of Onset  . Cancer Mother   . Diabetes Father   . Alzheimer's disease Maternal Grandfather   . Heart attack Paternal Grandmother     Died between 38 or 9 years old   History  Substance Use Topics  . Smoking status: Never Smoker   . Smokeless tobacco: Never Used  . Alcohol Use: No   OB History   Grav Para Term Preterm  Abortions TAB SAB Ect Mult Living                 Review of Systems  Constitutional: Negative for fever and fatigue.  HENT: Negative for congestion, drooling and neck pain.   Eyes: Negative for pain.  Respiratory: Negative for cough and shortness of breath.   Cardiovascular: Negative for chest pain.  Gastrointestinal: Negative for nausea, vomiting, abdominal pain and diarrhea.  Genitourinary: Negative for dysuria and hematuria.  Musculoskeletal: Negative for back pain and gait problem.  Skin: Negative for color change.  Neurological: Negative for dizziness and headaches.  Hematological: Negative for adenopathy.  Psychiatric/Behavioral: Negative for behavioral problems.  All other systems reviewed and are negative.    Allergies  Review of patient's allergies indicates no known allergies.  Home Medications   Current Outpatient Rx  Name  Route  Sig  Dispense  Refill  . clonazePAM (KLONOPIN) 1 MG tablet   Oral   Take 1 tablet (1 mg total) by mouth daily.   31 tablet   5   . glycopyrrolate (ROBINUL) 1 MG tablet   Oral   Take 1 tablet (1 mg total) by mouth 2 (two) times daily.   62 tablet   5   . ketoconazole (NIZORAL) 2 % cream   Topical   Apply 1 application  topically daily. Apply to stomach          BP 125/82  Temp(Src) 98.8 F (37.1 C) (Oral)  Resp 26  SpO2 99% Physical Exam  Nursing note and vitals reviewed. Constitutional: She is oriented to person, place, and time. She appears well-developed and well-nourished.  HENT:  Head: Normocephalic.  Mouth/Throat: Oropharynx is clear and moist. No oropharyngeal exudate.  No obvious traumatic injury to scalp.   Eyes: Conjunctivae and EOM are normal. Pupils are equal, round, and reactive to light.  Neck: Normal range of motion. Neck supple.  No cervical vertebrae ttp.   Cardiovascular: Normal rate, regular rhythm, normal heart sounds and intact distal pulses.  Exam reveals no gallop and no friction rub.   No murmur  heard. Pulmonary/Chest: Effort normal and breath sounds normal. No respiratory distress. She has no wheezes.  Abdominal: Soft. Bowel sounds are normal. There is no tenderness. There is no rebound and no guarding.  Musculoskeletal: Normal range of motion. She exhibits tenderness (mild non-focal ttp of right ankle). She exhibits no edema.  Neurological: She is alert and oriented to person, place, and time.  Skin: Skin is warm and dry.  Psychiatric: She has a normal mood and affect. Her behavior is normal.    ED Course  Procedures (including critical care time) Labs Review Labs Reviewed - No data to display Imaging Review Dg Ankle Complete Right  05/29/2013   CLINICAL DATA:  Fall with ankle pain  EXAM: RIGHT ANKLE - COMPLETE 3+ VIEW  COMPARISON:  None.  FINDINGS: No evidence for an acute fracture. No dislocation. Positioning limits assessment in subluxation at the ankle mortise cannot be excluded.  IMPRESSION: No evidence for fracture dislocation. Malalignment of the ankle mortise on 2 films raises the question of subluxation although non traditional positioning hinders assessment.   Electronically Signed   By: Kennith Center M.D.   On: 05/29/2013 11:27    MDM   1. Fall from wheelchair, initial encounter    10:38 AM 22 y.o. female who has a history of cerebral palsy and is wheelchair reliant presents with a fall which occurred prior to arrival. The family states that the patient had an unwitnessed fall where her wheelchair fell over backwards and she the back part of her head on the concrete. The patient denies any loss of consciousness. She notes only mild headache here. She is also complaining of right ankle pain. The patient is afebrile and vital signs are unremarkable here. I do not think the patient requires CT imaging of her head at this time as she has only mild headache and is happy and laughing on exam. Will get x-ray of ankle and Tylenol for pain control. Family is happy with this  plan.  12:17 PM: Pt continues to appear well, HA continues to decrease. The pt remains happy and interactive on exam.  I have discussed the diagnosis/risks/treatment options with the patient and caregiver and believe the pt to be eligible for discharge home to follow-up with pcp as needed. We also discussed returning to the ED immediately if new or worsening sx occur. We discussed the sx which are most concerning (e.g., worsening HA, AMS) that necessitate immediate return. Any new prescriptions provided to the patient are listed below.  New Prescriptions   No medications on file     Junius Argyle, MD 05/29/13 1610  Junius Argyle, MD 05/29/13 [phone]

## 2013-06-18 ENCOUNTER — Other Ambulatory Visit: Payer: Self-pay | Admitting: Physician Assistant

## 2013-06-18 NOTE — Telephone Encounter (Signed)
Medication refilled per protocol. 

## 2013-06-19 ENCOUNTER — Other Ambulatory Visit: Payer: Self-pay

## 2013-06-19 DIAGNOSIS — G40309 Generalized idiopathic epilepsy and epileptic syndromes, not intractable, without status epilepticus: Secondary | ICD-10-CM

## 2013-06-19 MED ORDER — CLONAZEPAM 1 MG PO TABS: 1.0000 mg | ORAL_TABLET | Freq: Every day | ORAL | Status: DC

## 2013-07-15 DIAGNOSIS — H5005 Alternating esotropia: Secondary | ICD-10-CM | POA: Diagnosis not present

## 2013-08-04 ENCOUNTER — Telehealth: Payer: Self-pay | Admitting: Family Medicine

## 2013-08-04 NOTE — Telephone Encounter (Signed)
Does the pt need to come back in to be seen? Someone called wanting to know if she needs to come and they would like a call back at [phone]

## 2013-08-05 NOTE — Telephone Encounter (Signed)
LMOVM that unless pt was having problems with anything she did not need an ov at this time.

## 2013-09-11 ENCOUNTER — Encounter: Payer: Self-pay | Admitting: Family Medicine

## 2013-09-11 NOTE — Telephone Encounter (Signed)
Asher MuirJamie from [facility] [phone]) is calling for [name] she is needing a new power wheel it is over 25 years old she is needing a new one, they are needing a prescription for a PT evaluation  Ramon DredgeShavone Bason [phone]) is [place]an[place] Representative

## 2013-09-17 ENCOUNTER — Other Ambulatory Visit: Payer: Self-pay | Admitting: Family

## 2013-09-17 DIAGNOSIS — K117 Disturbances of salivary secretion: Secondary | ICD-10-CM

## 2013-09-17 MED ORDER — GLYCOPYRROLATE 1 MG PO TABS: 1.0000 mg | ORAL_TABLET | Freq: Two times a day (BID) | ORAL | Status: DC

## 2013-09-22 NOTE — Telephone Encounter (Signed)
This encounter was created in error - please disregard.

## 2013-11-21 ENCOUNTER — Encounter (HOSPITAL_COMMUNITY): Payer: Self-pay | Admitting: Emergency Medicine

## 2013-11-21 ENCOUNTER — Emergency Department (HOSPITAL_COMMUNITY): Payer: Medicare Other

## 2013-11-21 ENCOUNTER — Emergency Department (HOSPITAL_COMMUNITY)
Admission: EM | Admit: 2013-11-21 | Discharge: 2013-11-23 | Disposition: A | Discharge status: Home Health | Payer: Medicare Other | Attending: Emergency Medicine | Admitting: Emergency Medicine

## 2013-11-21 DIAGNOSIS — Z862 Personal history of diseases of the blood and blood-forming organs and certain disorders involving the immune mechanism: Secondary | ICD-10-CM | POA: Insufficient documentation

## 2013-11-21 DIAGNOSIS — K117 Disturbances of salivary secretion: Secondary | ICD-10-CM

## 2013-11-21 DIAGNOSIS — Z975 Presence of (intrauterine) contraceptive device: Secondary | ICD-10-CM | POA: Insufficient documentation

## 2013-11-21 DIAGNOSIS — F29 Unspecified psychosis not due to a substance or known physiological condition: Secondary | ICD-10-CM | POA: Diagnosis not present

## 2013-11-21 DIAGNOSIS — F79 Unspecified intellectual disabilities: Secondary | ICD-10-CM | POA: Insufficient documentation

## 2013-11-21 DIAGNOSIS — R918 Other nonspecific abnormal finding of lung field: Secondary | ICD-10-CM | POA: Diagnosis not present

## 2013-11-21 DIAGNOSIS — Z79899 Other long term (current) drug therapy: Secondary | ICD-10-CM | POA: Diagnosis not present

## 2013-11-21 DIAGNOSIS — Z86718 Personal history of other venous thrombosis and embolism: Secondary | ICD-10-CM | POA: Diagnosis not present

## 2013-11-21 DIAGNOSIS — Z8742 Personal history of other diseases of the female genital tract: Secondary | ICD-10-CM | POA: Insufficient documentation

## 2013-11-21 DIAGNOSIS — Z993 Dependence on wheelchair: Secondary | ICD-10-CM | POA: Insufficient documentation

## 2013-11-21 DIAGNOSIS — F22 Delusional disorders: Secondary | ICD-10-CM | POA: Diagnosis not present

## 2013-11-21 DIAGNOSIS — R4182 Altered mental status, unspecified: Secondary | ICD-10-CM | POA: Diagnosis not present

## 2013-11-21 DIAGNOSIS — Z8669 Personal history of other diseases of the nervous system and sense organs: Secondary | ICD-10-CM | POA: Diagnosis not present

## 2013-11-21 DIAGNOSIS — Z9981 Dependence on supplemental oxygen: Secondary | ICD-10-CM | POA: Insufficient documentation

## 2013-11-21 LAB — ACETAMINOPHEN LEVEL: Acetaminophen (Tylenol), Serum: <15 ug/mL (ref 10–30)

## 2013-11-21 LAB — URINE MICROSCOPIC-ADD ON

## 2013-11-21 LAB — CBC WITH DIFFERENTIAL/PLATELET
BASOS ABS: 0 10*3/uL (ref 0.0–0.1)
BASOS PCT: 1 % (ref 0–1)
EOS ABS: 0.1 10*3/uL (ref 0.0–0.7)
EOS PCT: 1 % (ref 0–5)
HCT: 45.9 % (ref 36.0–46.0)
Hemoglobin: 16.4 g/dL — ABNORMAL HIGH (ref 12.0–15.0)
Lymphocytes Relative: 30 % (ref 12–46)
Lymphs Abs: 2.2 10*3/uL (ref 0.7–4.0)
MCH: 32.2 pg (ref 26.0–34.0)
MCHC: 35.7 g/dL (ref 30.0–36.0)
MCV: 90 fL (ref 78.0–100.0)
Monocytes Absolute: 0.9 10*3/uL (ref 0.1–1.0)
Monocytes Relative: 13 % — ABNORMAL HIGH (ref 3–12)
NEUTROS PCT: 56 % (ref 43–77)
Neutro Abs: 4.1 10*3/uL (ref 1.7–7.7)
PLATELETS: 274 10*3/uL (ref 150–400)
RBC: 5.1 MIL/uL (ref 3.87–5.11)
RDW: 12.9 % (ref 11.5–15.5)
WBC: 7.3 10*3/uL (ref 4.0–10.5)

## 2013-11-21 LAB — URINALYSIS, ROUTINE W REFLEX MICROSCOPIC
GLUCOSE, UA: NEGATIVE mg/dL
Ketones, ur: 40 mg/dL — AB
Leukocytes, UA: NEGATIVE
NITRITE: NEGATIVE
PH: 5.5 (ref 5.0–8.0)
Protein, ur: NEGATIVE mg/dL
SPECIFIC GRAVITY, URINE: 1.025 (ref 1.005–1.030)
Urobilinogen, UA: 1 mg/dL (ref 0.0–1.0)

## 2013-11-21 LAB — COMPREHENSIVE METABOLIC PANEL
ALBUMIN: 4.4 g/dL (ref 3.5–5.2)
ALT: 26 U/L (ref 0–35)
AST: 52 U/L — AB (ref 0–37)
Alkaline Phosphatase: 79 U/L (ref 39–117)
BUN: 5 mg/dL — ABNORMAL LOW (ref 6–23)
CALCIUM: 10.1 mg/dL (ref 8.4–10.5)
CO2: 25 mEq/L (ref 19–32)
Chloride: 99 mEq/L (ref 96–112)
Creatinine, Ser: 0.51 mg/dL (ref 0.50–1.10)
GFR calc non Af Amer: >90 mL/min (ref >90)
GLUCOSE: 105 mg/dL — AB (ref 70–99)
Potassium: 3.4 mEq/L — ABNORMAL LOW (ref 3.7–5.3)
SODIUM: 141 meq/L (ref 137–147)
TOTAL PROTEIN: 8.3 g/dL (ref 6.0–8.3)
Total Bilirubin: 1 mg/dL (ref 0.3–1.2)

## 2013-11-21 LAB — SALICYLATE LEVEL

## 2013-11-21 MED ORDER — SODIUM CHLORIDE 0.9 % IV BOLUS (SEPSIS)
1000.0000 mL | Freq: Once | INTRAVENOUS | Status: AC
  Administered 2013-11-21: 1000 mL via INTRAVENOUS

## 2013-11-21 MED ORDER — SODIUM CHLORIDE 0.9 % IV BOLUS (SEPSIS)
1000.0000 mL | Freq: Once | INTRAVENOUS | Status: AC
  Administered 2013-11-22: 1000 mL via INTRAVENOUS

## 2013-11-21 MED ORDER — LORAZEPAM 2 MG/ML IJ SOLN
1.0000 mg | Freq: Once | INTRAMUSCULAR | Status: AC
  Administered 2013-11-21: 1 mg via INTRAVENOUS
  Filled 2013-11-21: qty 1

## 2013-11-21 MED ORDER — CLONAZEPAM 0.5 MG PO TABS
0.5000 mg | ORAL_TABLET | Freq: Three times a day (TID) | ORAL | Status: DC | PRN
  Administered 2013-11-22 (×2): 0.5 mg via ORAL
  Filled 2013-11-21 (×2): qty 1

## 2013-11-21 MED ORDER — CLONAZEPAM 0.5 MG PO TABS
1.0000 mg | ORAL_TABLET | Freq: Every morning | ORAL | Status: DC
  Administered 2013-11-22 – 2013-11-23 (×2): 1 mg via ORAL
  Filled 2013-11-21 (×2): qty 2

## 2013-11-21 NOTE — ED Notes (Signed)
Pt agitated at this moment will allow ativan to take effect then attempt to obtain urine via in and out.

## 2013-11-21 NOTE — BH Assessment (Signed)
Assessment Note  Nancy [name] is an 23 y.o. female who presents to [facility] Emergency Department with the chief complaint of paranoia. Patient's mother, father, and caregiver were present during assessment. Patient's mother reports that patient was "talking out of her head and screaming most of the night". Patient recently moved to a new assisted living facility named [facility] this week where she has a personal aide who provides care for her. Patient was alert and oriented x4 during the assessment and exhibited symptoms of paranoia AEB moments of hypervigilance, inability to be still and remain, feelings of panic. Patient does not endorse active suicidal ideations. Patient denies HI/AVH as well. Patient has a noted diagnosis of cerebral palsy and mental retardation per patient's mother. Patient's parents report concern in regard to patient's behaviors due to symptoms of paranoia not being exhibited in the past. Clinician consulted with Maryjean Morn, PA-C who recommends further evaluation by psychiatry at this time.   Axis I: Brief Psychotic Disorder Axis II: Mental retardation, severity unknown Axis III:  Past Medical History  Diagnosis Date  . CP (cerebral palsy)     with mild contracture  . MR (mental retardation)   . Mood disorder   . Peripheral vascular disease 10/02/2006    bilateral DVT lower legs  . Wheelchair bound     r/t CP  . Seizures     childhood  . Menorrhagia with regular cycle 11/11/2012  . Mobility impaired 11/11/2012  . IUD (intrauterine device) in place 10/2012  . Anemia     h/o iron deficiency   Axis IV: other psychosocial or environmental problems Axis V: 51-60 moderate symptoms  Past Medical History:  Past Medical History  Diagnosis Date  . CP (cerebral palsy)     with mild contracture  . MR (mental retardation)   . Mood disorder   . Peripheral vascular disease 10/02/2006    bilateral DVT lower legs  . Wheelchair bound     r/t CP  . Seizures      childhood  . Menorrhagia with regular cycle 11/11/2012  . Mobility impaired 11/11/2012  . IUD (intrauterine device) in place 10/2012  . Anemia     h/o iron deficiency    Past Surgical History  Procedure Laterality Date  . Multiple tooth extractions  10/18/10  . Leg surgery      as a child  . Eye surgery      as a child  . Intrauterine device (iud) insertion N/A 11/12/2012    Procedure: INTRAUTERINE DEVICE (IUD) INSERTION;  Surgeon: Sherron Monday, MD;  Location: [facility];  Service: Gynecology;  Laterality: N/A;  . Hip surgery Right 2003    Family History:  Family History  Problem Relation Age of Onset  . Cancer Mother   . Diabetes Father   . Alzheimer's disease Maternal Grandfather   . Heart attack Paternal Grandmother     Died between 46 or 21 years old    Social History:  reports that she has never smoked. She has never used smokeless tobacco. She reports that she does not drink alcohol or use illicit drugs.  Additional Social History:  Alcohol / Drug Use History of alcohol / drug use?: No history of alcohol / drug abuse  CIWA: CIWA-Ar BP: 95/71 mmHg Pulse Rate: 123 COWS:    Allergies: No Known Allergies  Home Medications:  (Not in a hospital admission)  OB/GYN Status:  No LMP recorded. Patient is not currently having periods (Reason: IUD).  General  Assessment Data Location of Assessment: [facility] ED Is this a Tele or Face-to-Face Assessment?: Face-to-Face Is this an Initial Assessment or a Re-assessment for this encounter?: Initial Assessment Living Arrangements: Other (Comment) (Group Home) Can pt return to current living arrangement?: Yes Admission Status: Voluntary Is patient capable of signing voluntary admission?: No Transfer from: Group Home Referral Source: Self/Family/Friend     [facility] Crisis Care Plan Living Arrangements: Other (Comment) (Group Home) Name of Psychiatrist: None Name of Therapist: None  Education Status Is patient currently in school?:  No  Risk to self Suicidal Ideation: No Suicidal Intent: No Is patient at risk for suicide?: No Suicidal Plan?: No Access to Means: No What has been your use of drugs/alcohol within the last 12 months?: None Previous Attempts/Gestures: No How many times?: 0 Other Self Harm Risks: None Triggers for Past Attempts: None known Intentional Self Injurious Behavior: None Family Suicide History: No Persecutory voices/beliefs?: No Depression: No Substance abuse history and/or treatment for substance abuse?: No  Risk to Others Homicidal Ideation: No Thoughts of Harm to Others: No Current Homicidal Intent: No Current Homicidal Plan: No Access to Homicidal Means: No Identified Victim: None History of harm to others?: No Assessment of Violence: None Noted Violent Behavior Description: None Does patient have access to weapons?: No Criminal Charges Pending?: No Does patient have a court date: No  Psychosis Hallucinations: None noted Delusions: None noted  Mental Status Report Appear/Hygiene: Other (Comment) (Appropriate) Eye Contact: Fair Motor Activity: Unsteady Speech: Slow Level of Consciousness: Quiet/awake Mood: Anxious Affect: Anxious Anxiety Level: Minimal Thought Processes: Coherent;Relevant Judgement: Impaired Orientation: Person;Place;Time Obsessive Compulsive Thoughts/Behaviors: None  Cognitive Functioning Concentration: Decreased Memory: Recent Intact;Remote Intact IQ: Below Average Level of Function: Unknown Insight: Fair Impulse Control: Poor Appetite: Poor Weight Loss: 0 Weight Gain: 0 Sleep: No Change Vegetative Symptoms: None  ADLScreening [facility] Assessment Services) Patient's cognitive ability adequate to safely complete daily activities?: Yes Patient able to express need for assistance with ADLs?: Yes Independently performs ADLs?: No  Prior Inpatient Therapy Prior Inpatient Therapy: No  Prior Outpatient Therapy Prior Outpatient Therapy:  No  ADL Screening (condition at time of admission) Patient's cognitive ability adequate to safely complete daily activities?: Yes Is the patient deaf or have difficulty hearing?: No Does the patient have difficulty seeing, even when wearing glasses/contacts?: No Does the patient have difficulty concentrating, remembering, or making decisions?: Yes Patient able to express need for assistance with ADLs?: Yes Does the patient have difficulty dressing or bathing?: Yes Independently performs ADLs?: No Communication: Independent Dressing (OT): Needs assistance Is this a change from baseline?: Pre-admission baseline Grooming: Needs assistance Is this a change from baseline?: Pre-admission baseline Feeding: Needs assistance Is this a change from baseline?: Pre-admission baseline Bathing: Needs assistance Is this a change from baseline?: Pre-admission baseline Toileting: Needs assistance Is this a change from baseline?: Pre-admission baseline In/Out Bed: Needs assistance Is this a change from baseline?: Pre-admission baseline Walks in Home: Dependent Is this a change from baseline?: Pre-admission baseline Does the patient have difficulty walking or climbing stairs?: Yes Weakness of Legs: Both Weakness of Arms/Hands: None  Home Assistive Devices/Equipment Home Assistive Devices/Equipment: Wheelchair  Therapy Consults (therapy consults require a physician order) PT Evaluation Needed: No OT Evalulation Needed: No SLP Evaluation Needed: No Abuse/Neglect Assessment (Assessment to be complete while patient is alone) Physical Abuse: Denies Verbal Abuse: Denies Sexual Abuse: Denies Exploitation of patient/patient's resources: Denies Self-Neglect: Denies Values / Beliefs Cultural Requests During Hospitalization: None Spiritual Requests During Hospitalization: None Consults  Spiritual Care Consult Needed: No Social Work Consult Needed: No      Additional Information 1:1 In Past 12  Months?: Yes CIRT Risk: No Elopement Risk: No Does patient have medical clearance?: Yes     Disposition: Clinician consulted with Maryjean Mornharles Kober, PA-C who recommends further evaluation by psychiatry tomorrow.  Disposition Initial Assessment Completed for this Encounter: Yes  On Site Evaluation by:   Reviewed with Physician:    Paulino DoorPICKETT JR, Jomar Denz C 11/21/2013 9:15 PM

## 2013-11-21 NOTE — ED Provider Notes (Signed)
CSN: [id number]     Arrival date & time 11/21/13  1625 History   First MD Initiated Contact with Patient 11/21/13 1656     No chief complaint on file.    (Consider location/radiation/quality/duration/timing/severity/associated sxs/prior Treatment) HPI Comments: Patient with PMH of cerebral palsy and MR presents to the ED with a chief complaint of AMS.  She is accompanied by her mother and her caregiver.  The caregiver states that the patient has been behaving differently over the past week.  She states that the patient has been hearing things and making bizarre comments.  She states that this is not normal for the patient.  The caregiver denies noticing any fever, cough, vomiting, or changes in bowel habits.  The caregiver does states that the patient has not been eating or drinking like normal.  Level 5 caveat applies secondary to mental status.  The history is provided by a parent and a caregiver. No language interpreter was used.    Past Medical History  Diagnosis Date  . CP (cerebral palsy)     with mild contracture  . MR (mental retardation)   . Mood disorder   . Peripheral vascular disease 10/02/2006    bilateral DVT lower legs  . Wheelchair bound     r/t CP  . Seizures     childhood  . Menorrhagia with regular cycle 11/11/2012  . Mobility impaired 11/11/2012  . IUD (intrauterine device) in place 10/2012  . Anemia     h/o iron deficiency   Past Surgical History  Procedure Laterality Date  . Multiple tooth extractions  10/18/10  . Leg surgery      as a child  . Eye surgery      as a child  . Intrauterine device (iud) insertion N/A 11/12/2012    Procedure: INTRAUTERINE DEVICE (IUD) INSERTION;  Surgeon: Sherron MondayJody Bovard, MD;  Location: [facility];  Service: Gynecology;  Laterality: N/A;  . Hip surgery Right 2003   Family History  Problem Relation Age of Onset  . Cancer Mother   . Diabetes Father   . Alzheimer's disease Maternal Grandfather   . Heart attack Paternal Grandmother    Died between 3660 or 8358 years old   History  Substance Use Topics  . Smoking status: Never Smoker   . Smokeless tobacco: Never Used  . Alcohol Use: No   OB History   Grav Para Term Preterm Abortions TAB SAB Ect Mult Living                 Review of Systems  Unable to perform ROS: Mental status change      Allergies  Review of patient's allergies indicates no known allergies.  Home Medications   Current Outpatient Rx  Name  Route  Sig  Dispense  Refill  . clonazePAM (KLONOPIN) 1 MG tablet   Oral   Take 1 tablet (1 mg total) by mouth daily.   31 tablet   5   . glycopyrrolate (ROBINUL) 1 MG tablet   Oral   Take 1 tablet (1 mg total) by mouth 2 (two) times daily.   60 tablet   3   . ketoconazole (NIZORAL) 2 % cream   Topical   Apply 1 application topically daily. Apply to stomach         . ketoconazole (NIZORAL) 2 % shampoo      APPLY 10mls TOPICALLY ONCE A DAY.   120 mL   2    There were no vitals taken  for this visit. Physical Exam  Nursing note and vitals reviewed. Constitutional: She is oriented to person, place, and time. She appears well-developed and well-nourished.  Mild general contractures  HENT:  Head: Normocephalic and atraumatic.  Dry mucous membranes, mouth is red from eating strawberries  Eyes: Conjunctivae and EOM are normal. Pupils are equal, round, and reactive to light.  Neck: Normal range of motion. Neck supple.  Cardiovascular: Regular rhythm.  Exam reveals no gallop and no friction rub.   No murmur heard. Pulmonary/Chest: Effort normal and breath sounds normal. No respiratory distress. She has no wheezes. She has no rales. She exhibits no tenderness.  Abdominal: Soft. Bowel sounds are normal. She exhibits no distension and no mass. There is no tenderness. There is no rebound and no guarding.  Musculoskeletal: Normal range of motion. She exhibits no edema and no tenderness.  Neurological: She is alert and oriented to person, place, and  time.  Skin: Skin is warm and dry.  Psychiatric: She has a normal mood and affect. Her behavior is normal. Judgment and thought content normal.    ED Course  Procedures (including critical care time) Results for orders placed during the hospital encounter of 11/21/13  CBC WITH DIFFERENTIAL      Result Value Ref Range   WBC 7.3  4.0 - 10.5 K/uL   RBC 5.10  3.87 - 5.11 MIL/uL   Hemoglobin 16.4 (*) 12.0 - 15.0 g/dL   HCT 40.9  81.1 - 91.4 %   MCV 90.0  78.0 - 100.0 fL   MCH 32.2  26.0 - 34.0 pg   MCHC 35.7  30.0 - 36.0 g/dL   RDW 78.2  95.6 - 21.3 %   Platelets 274  150 - 400 K/uL   Neutrophils Relative % 56  43 - 77 %   Neutro Abs 4.1  1.7 - 7.7 K/uL   Lymphocytes Relative 30  12 - 46 %   Lymphs Abs 2.2  0.7 - 4.0 K/uL   Monocytes Relative 13 (*) 3 - 12 %   Monocytes Absolute 0.9  0.1 - 1.0 K/uL   Eosinophils Relative 1  0 - 5 %   Eosinophils Absolute 0.1  0.0 - 0.7 K/uL   Basophils Relative 1  0 - 1 %   Basophils Absolute 0.0  0.0 - 0.1 K/uL  COMPREHENSIVE METABOLIC PANEL      Result Value Ref Range   Sodium 141  137 - 147 mEq/L   Potassium 3.4 (*) 3.7 - 5.3 mEq/L   Chloride 99  96 - 112 mEq/L   CO2 25  19 - 32 mEq/L   Glucose, Bld 105 (*) 70 - 99 mg/dL   BUN 5 (*) 6 - 23 mg/dL   Creatinine, Ser 0.86  0.50 - 1.10 mg/dL   Calcium 57.8  8.4 - 46.9 mg/dL   Total Protein 8.3  6.0 - 8.3 g/dL   Albumin 4.4  3.5 - 5.2 g/dL   AST 52 (*) 0 - 37 U/L   ALT 26  0 - 35 U/L   Alkaline Phosphatase 79  39 - 117 U/L   Total Bilirubin 1.0  0.3 - 1.2 mg/dL   GFR calc non Af Amer >90  >90 mL/min   GFR calc Af Amer >90  >90 mL/min  URINALYSIS, ROUTINE W REFLEX MICROSCOPIC      Result Value Ref Range   Color, Urine AMBER (*) YELLOW   APPearance CLOUDY (*) CLEAR   Specific Gravity, Urine 1.025  1.005 - 1.030   pH 5.5  5.0 - 8.0   Glucose, UA NEGATIVE  NEGATIVE mg/dL   Hgb urine dipstick SMALL (*) NEGATIVE   Bilirubin Urine MODERATE (*) NEGATIVE   Ketones, ur 40 (*) NEGATIVE mg/dL    Protein, ur NEGATIVE  NEGATIVE mg/dL   Urobilinogen, UA 1.0  0.0 - 1.0 mg/dL   Nitrite NEGATIVE  NEGATIVE   Leukocytes, UA NEGATIVE  NEGATIVE  ACETAMINOPHEN LEVEL      Result Value Ref Range   Acetaminophen (Tylenol), Serum <15.0  10 - 30 ug/mL  SALICYLATE LEVEL      Result Value Ref Range   Salicylate Lvl <2.0 (*) 2.8 - 20.0 mg/dL  URINE MICROSCOPIC-ADD ON      Result Value Ref Range   Squamous Epithelial / LPF RARE  RARE   WBC, UA 3-6  <3 WBC/hpf   RBC / HPF 0-2  <3 RBC/hpf   Bacteria, UA MANY (*) RARE   Urine-Other MUCOUS PRESENT     Dg Chest 2 View  11/21/2013   CLINICAL DATA:  None eating and drinking.  EXAM: CHEST  2 VIEW  COMPARISON:  2/0 6/0 rate  FINDINGS: The heart size and mediastinal contours are within normal limits. Both lungs are clear. The visualized skeletal structures are unremarkable.  IMPRESSION: No active cardiopulmonary disease.   Electronically Signed   By: Amie Portland M.D.   On: 11/21/2013 18:06   Ct Head Wo Contrast  11/21/2013   CLINICAL DATA:  Altered mental status, agitation  EXAM: CT HEAD WITHOUT CONTRAST  TECHNIQUE: Contiguous axial images were obtained from the base of the skull through the vertex without intravenous contrast.  COMPARISON:  None.  FINDINGS: Motion degraded images.  No evidence of parenchymal hemorrhage or extra-axial fluid collection. No mass lesion, mass effect, or midline shift.  No CT evidence of acute infarction.  Mild cortical atrophy. No ventriculomegaly. Angular configuration of the posterior horn of the lateral ventricles (series 2/image 20) suggests sequela of prenatal periventricular leukomalacia. Associated cerebellar calcifications (series 2/image 13), possibly sequela of perinatal hemorrhage.  The visualized paranasal sinuses are essentially clear. The mastoid air cells are unopacified.  No evidence of calvarial fracture.  IMPRESSION: Motion degraded images.  No evidence of acute intracranial abnormality.  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   Electronically Signed   By: Charline Bills M.D.   On: 11/21/2013 18:14      EKG Interpretation None      MDM   Final diagnoses:  None    Patient with new AMS/delirium.  Will check labs and CT scan.  Will reassess.  8:02 PM Labs and workup are reassuring.  Given the patient's new hallucinations, will place psych hold orders.  I have discussed this with Dr. Blinda Leatherwood, who agrees with the plan.  11:25 PM Psychiatry will see the patient in the AM.  Roxy Horseman, PA-C 11/22/13 0041

## 2013-11-21 NOTE — ED Notes (Signed)
Pt from home with mother and caregiver. Pt care giver c/o pt yelling in middle of night for hours, hearing things that aren't there. Caregiver reports pt has not been eating and drinking like normal.

## 2013-11-21 NOTE — ED Notes (Signed)
[facility], patient to receive psychiatric consult in the morning. MD notified.

## 2013-11-21 NOTE — Consult Note (Signed)
  Contacted by TTS.Low functioning MR/CP 23 y/o with acute episode paranoia.Recommend pt be assessed in AM by Psych MD/Social work.[facility] a [facility] due to MR status as reported.

## 2013-11-22 ENCOUNTER — Encounter (HOSPITAL_COMMUNITY): Payer: Self-pay | Admitting: Registered Nurse

## 2013-11-22 DIAGNOSIS — F29 Unspecified psychosis not due to a substance or known physiological condition: Secondary | ICD-10-CM | POA: Diagnosis not present

## 2013-11-22 DIAGNOSIS — F22 Delusional disorders: Secondary | ICD-10-CM | POA: Diagnosis not present

## 2013-11-22 MED ORDER — RISPERIDONE 0.5 MG PO TABS
0.5000 mg | ORAL_TABLET | Freq: Once | ORAL | Status: AC
  Administered 2013-11-22: 0.5 mg via ORAL
  Filled 2013-11-22: qty 1

## 2013-11-22 MED ORDER — ZOLPIDEM TARTRATE 5 MG PO TABS
5.0000 mg | ORAL_TABLET | Freq: Every evening | ORAL | Status: DC | PRN
  Administered 2013-11-22: 5 mg via ORAL
  Filled 2013-11-22 (×2): qty 1

## 2013-11-22 MED ORDER — GLYCOPYRROLATE 1 MG PO TABS
1.0000 mg | ORAL_TABLET | Freq: Two times a day (BID) | ORAL | Status: DC
  Administered 2013-11-23: 1 mg via ORAL
  Filled 2013-11-22 (×5): qty 1

## 2013-11-22 MED ORDER — LORAZEPAM 2 MG/ML IJ SOLN
1.0000 mg | Freq: Once | INTRAMUSCULAR | Status: AC
  Administered 2013-11-22: 1 mg via INTRAVENOUS

## 2013-11-22 MED ORDER — RISPERIDONE 0.5 MG PO TABS
0.5000 mg | ORAL_TABLET | Freq: Every day | ORAL | Status: DC
  Filled 2013-11-22: qty 1

## 2013-11-22 MED ORDER — LORAZEPAM 2 MG/ML IJ SOLN
1.0000 mg | Freq: Once | INTRAMUSCULAR | Status: DC
  Filled 2013-11-22: qty 1

## 2013-11-22 NOTE — Consult Note (Addendum)
[facility] Psychiatry Consult   Reason for Consult:  Paranoia Referring Physician:  EDP  Nancy [name] is an 23 y.o. female. 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 CP (cerebral palsy)     with mild contracture  . MR (mental retardation)   . Mood disorder   . Peripheral vascular disease 10/02/2006    bilateral DVT lower legs  . Wheelchair bound     r/t CP  . Seizures     childhood  . Menorrhagia with regular cycle 11/11/2012  . Mobility impaired 11/11/2012  . IUD (intrauterine device) in place 10/2012  . Anemia     h/o iron deficiency   AXIS IV:  other psychosocial or environmental problems AXIS V:  51-60 moderate symptoms  Plan:  No evidence of imminent risk to self or others at present.   Monitor over night  Subjective:   Nancy [name] is a 23 y.o. female patient admitted.  HPI:  Patient resides in assisted living facility and one of the care givers is at bed side and states that patient has been with home for 3 days.  "On Friday patient states that someone was after her and Dondra Spry was trying to kill her. Spoke with her parents and nobody knows who Sherrie is."  Patient states I'm in home because I have MS.  I really don't know what it is; but something in my body; I really think something is going on in my body. I never had a tumor but can you check for me to make sure I don't have a tumor.  I do hear voices; and I be replying back. My grandfathers tells me "it's going to be okay baby just relax; somebody gonna help you"."  Patient states that she doesn't know a Sherrie and could not recall telling staff that Dondra Spry was trying to hurt her.  Patient has no psych history.   HPI Elements:   Location:  Paranoia. Quality:  Thinking someone is trying to hurt her. Severity:  Thinking someone is trying to hurt her.. Timing:  3  days.  Past Psychiatric History: Past Medical History  Diagnosis Date  . CP (cerebral palsy)     with mild contracture  . MR (mental retardation)   . Mood disorder   . Peripheral vascular disease 10/02/2006    bilateral DVT lower legs  . Wheelchair bound     r/t CP  . Seizures     childhood  . Menorrhagia with regular cycle 11/11/2012  . Mobility impaired 11/11/2012  . IUD (intrauterine device) in place 10/2012  . Anemia     h/o iron deficiency    reports that she has never smoked. She has never used smokeless tobacco. She reports that she does not drink alcohol or use illicit drugs. Family History  Problem Relation Age of Onset  . Cancer Mother   . Diabetes Father   . Alzheimer's disease Maternal Grandfather   . Heart attack Paternal Grandmother     Died between 33 or 65 years old   Family History Substance Abuse: No Family Supports: No Living Arrangements: Other (Comment) (Group Home) Can pt return to current living arrangement?: Yes Abuse/Neglect [facility]) Physical Abuse: Denies Verbal Abuse: Denies Sexual Abuse: Denies Allergies:  No Known Allergies  ACT Assessment Complete:  Yes:    Educational Status  Risk to Self: Risk to self Suicidal Ideation: No Suicidal Intent: No Is patient at risk for suicide?: No Suicidal Plan?: No Access to Means: No What has been your use of drugs/alcohol within the last 12 months?: None Previous Attempts/Gestures: No How many times?: 0 Other Self Harm Risks: None Triggers for Past Attempts: None known Intentional Self Injurious Behavior: None Family Suicide History: No Persecutory voices/beliefs?: No Depression: No Substance abuse history and/or treatment for substance abuse?: No  Risk to Others: Risk to Others Homicidal Ideation: No Thoughts of Harm to Others: No Current Homicidal Intent: No Current Homicidal Plan: No Access to Homicidal Means: No Identified Victim: None History of harm to others?: No Assessment of  Violence: None Noted Violent Behavior Description: None Does patient have access to weapons?: No Criminal Charges Pending?: No Does patient have a court date: No  Abuse: Abuse/Neglect Assessment (Assessment to be complete while patient is alone) Physical Abuse: Denies Verbal Abuse: Denies Sexual Abuse: Denies Exploitation of patient/patient's resources: Denies Self-Neglect: Denies  Prior Inpatient Therapy: Prior Inpatient Therapy Prior Inpatient Therapy: No  Prior Outpatient Therapy: Prior Outpatient Therapy Prior Outpatient Therapy: No  Additional Information: Additional Information 1:1 In Past 12 Months?: Yes CIRT Risk: No Elopement Risk: No Does patient have medical clearance?: Yes    Objective: Blood pressure 147/70, pulse 82, temperature 98 F (36.7 C), temperature source Oral, resp. rate 14, SpO2 98.00%.There is no weight on file to calculate BMI. Results for orders placed during the hospital encounter of 11/21/13 (from the past 72 hour(s))  CBC WITH DIFFERENTIAL     Status: Abnormal   Collection Time    11/21/13  5:39 PM      Result Value Ref Range   WBC 7.3  4.0 - 10.5 K/uL   RBC 5.10  3.87 - 5.11 MIL/uL   Hemoglobin 16.4 (*) 12.0 - 15.0 g/dL   HCT 45.9  36.0 - 46.0 %   MCV 90.0  78.0 - 100.0 fL   MCH 32.2  26.0 - 34.0 pg   MCHC 35.7  30.0 - 36.0 g/dL   RDW 12.9  11.5 - 15.5 %   Platelets 274  150 - 400 K/uL   Neutrophils Relative % 56  43 - 77 %   Neutro Abs 4.1  1.7 - 7.7 K/uL   Lymphocytes Relative 30  12 - 46 %   Lymphs Abs 2.2  0.7 - 4.0 K/uL   Monocytes Relative 13 (*) 3 - 12 %   Monocytes Absolute 0.9  0.1 - 1.0 K/uL   Eosinophils Relative 1  0 - 5 %   Eosinophils Absolute 0.1  0.0 - 0.7 K/uL   Basophils Relative 1  0 - 1 %   Basophils Absolute 0.0  0.0 - 0.1 K/uL  COMPREHENSIVE METABOLIC PANEL     Status: Abnormal   Collection Time    11/21/13  5:39 PM      Result Value Ref Range   Sodium 141  137 - 147 mEq/L   Potassium 3.4 (*) 3.7 - 5.3 mEq/L    Chloride 99  96 - 112 mEq/L   CO2 25  19 - 32 mEq/L   Glucose, Bld 105 (*) 70 - 99 mg/dL   BUN 5 (*) 6 - 23 mg/dL   Creatinine, Ser 0.51  0.50 - 1.10 mg/dL   Calcium 10.1  8.4 - 10.5 mg/dL   Total Protein 8.3  6.0 - 8.3 g/dL   Albumin 4.4  3.5 - 5.2 g/dL  AST 52 (*) 0 - 37 U/L   ALT 26  0 - 35 U/L   Alkaline Phosphatase 79  39 - 117 U/L   Total Bilirubin 1.0  0.3 - 1.2 mg/dL   GFR calc non Af Amer >90  >90 mL/min   GFR calc Af Amer >90  >90 mL/min   Comment: (NOTE)     The eGFR has been calculated using the CKD EPI equation.     This calculation has not been validated in all clinical situations.     eGFR's persistently <90 mL/min signify possible Chronic Kidney     Disease.  ACETAMINOPHEN LEVEL     Status: None   Collection Time    11/21/13  5:39 PM      Result Value Ref Range   Acetaminophen (Tylenol), Serum <15.0  10 - 30 ug/mL   Comment:            THERAPEUTIC CONCENTRATIONS VARY     SIGNIFICANTLY. A RANGE OF 10-30     ug/mL MAY BE AN EFFECTIVE     CONCENTRATION FOR MANY PATIENTS.     HOWEVER, SOME ARE BEST TREATED     AT CONCENTRATIONS OUTSIDE THIS     RANGE.     ACETAMINOPHEN CONCENTRATIONS     >150 ug/mL AT 4 HOURS AFTER     INGESTION AND >50 ug/mL AT 12     HOURS AFTER INGESTION ARE     OFTEN ASSOCIATED WITH TOXIC     REACTIONS.  SALICYLATE LEVEL     Status: Abnormal   Collection Time    11/21/13  5:39 PM      Result Value Ref Range   Salicylate Lvl <5.2 (*) 2.8 - 20.0 mg/dL  URINALYSIS, ROUTINE W REFLEX MICROSCOPIC     Status: Abnormal   Collection Time    11/21/13  7:04 PM      Result Value Ref Range   Color, Urine AMBER (*) YELLOW   Comment: BIOCHEMICALS MAY BE AFFECTED BY COLOR   APPearance CLOUDY (*) CLEAR   Specific Gravity, Urine 1.025  1.005 - 1.030   pH 5.5  5.0 - 8.0   Glucose, UA NEGATIVE  NEGATIVE mg/dL   Hgb urine dipstick SMALL (*) NEGATIVE   Bilirubin Urine MODERATE (*) NEGATIVE   Ketones, ur 40 (*) NEGATIVE mg/dL   Protein, ur NEGATIVE   NEGATIVE mg/dL   Urobilinogen, UA 1.0  0.0 - 1.0 mg/dL   Nitrite NEGATIVE  NEGATIVE   Leukocytes, UA NEGATIVE  NEGATIVE  URINE MICROSCOPIC-ADD ON     Status: Abnormal   Collection Time    11/21/13  7:04 PM      Result Value Ref Range   Squamous Epithelial / LPF RARE  RARE   WBC, UA 3-6  <3 WBC/hpf   RBC / HPF 0-2  <3 RBC/hpf   Bacteria, UA MANY (*) RARE   Urine-Other MUCOUS PRESENT     Labs are reviewed and are pertinent for see CBC and Urinalysis.  Medications review no changes made to home medications. Patient was ordered Risperdal 0.5 mg now and hs Urinalysis    Component Value Date/Time   COLORURINE AMBER* 11/21/2013 1904   APPEARANCEUR CLOUDY* 11/21/2013 1904   LABSPEC 1.025 11/21/2013 1904   PHURINE 5.5 11/21/2013 1904   GLUCOSEU NEGATIVE 11/21/2013 1904   HGBUR SMALL* 11/21/2013 1904   BILIRUBINUR MODERATE* 11/21/2013 1904   KETONESUR 40* 11/21/2013 1904   PROTEINUR NEGATIVE 11/21/2013 1904   UROBILINOGEN 1.0 11/21/2013 1904  NITRITE NEGATIVE 11/21/2013 1904   LEUKOCYTESUR NEGATIVE 11/21/2013 1904      Current Facility-Administered Medications  Medication Dose Route Frequency Provider Last Rate Last Dose  . clonazePAM (KLONOPIN) tablet 0.5 mg  0.5 mg Oral TID PRN Montine Circle, PA-C   0.5 mg at 11/22/13 0121  . clonazePAM (KLONOPIN) tablet 1 mg  1 mg Oral q morning - 10a Montine Circle, PA-C   1 mg at 11/22/13 4235  . glycopyrrolate (ROBINUL) tablet 1 mg  1 mg Oral BID Leota Jacobsen, MD      . risperiDONE (RISPERDAL) tablet 0.5 mg  0.5 mg Oral QHS Shuvon Rankin, NP      . risperiDONE (RISPERDAL) tablet 0.5 mg  0.5 mg Oral Once Shuvon Rankin, NP      . zolpidem (AMBIEN) tablet 5 mg  5 mg Oral QHS PRN Montine Circle, PA-C   5 mg at 11/22/13 0121   Current Outpatient Prescriptions  Medication Sig Dispense Refill  . clonazePAM (KLONOPIN) 1 MG tablet Take 1 mg by mouth every morning.      . clonazePAM (KLONOPIN) 1 MG tablet Take 0.5 mg by mouth 3 (three) times daily as  needed for anxiety.      Marland Kitchen glycopyrrolate (ROBINUL) 1 MG tablet Take 1 tablet (1 mg total) by mouth 2 (two) times daily.  60 tablet  3  . ketoconazole (NIZORAL) 2 % shampoo Apply 1 application topically 2 (two) times daily as needed for irritation.        Psychiatric Specialty Exam:     Blood pressure 147/70, pulse 82, temperature 98 F (36.7 C), temperature source Oral, resp. rate 14, SpO2 98.00%.There is no weight on file to calculate BMI.  General Appearance: Casual  Eye Contact::  Fair  Speech:  Garbled and Related to speech impairment   Volume:  Normal  Mood:  Depressed  Affect:  Appropriate  Thought Process:  Circumstantial and Goal Directed  Orientation:  Full (Time, Place, and Person)  Thought Content:  "I think something is going on in my body"  Suicidal Thoughts:  No  Homicidal Thoughts:  No  Memory:  Immediate;   Fair Recent;   Fair  Judgement:  Fair  Insight:  Fair  Psychomotor Activity:  Decreased  Concentration:  Fair  Recall:  [employer] of Knowledge:Fair  Language: Fair  Akathisia:  No  Handed:  Right  AIMS (if indicated):     Assets:  Desire for Improvement Housing  Sleep:      Musculoskeletal: Strength & Muscle Tone: MS Gait & Station: normal, Patient lying in bed.  wheel chair bound Patient leans: Patient lying in bed  Treatment Plan Summary: Observe overnight.  Will start Risperdal 0.5 mg and monitor patient overnight.  It tolerating well and no episodes; Can discharge in the morning.    Earleen Newport, FNP-BC 11/22/2013 2:58 PM  Patient was seen face to face for evaluation and case discussed with physician extender, formulated treatment plan and reviewed the information documented and agree with the treatment plan.  Lera Gaines,JANARDHAHA R. 11/23/2013 8:54 AM

## 2013-11-22 NOTE — ED Notes (Signed)
Patient is resting comfortably. 

## 2013-11-22 NOTE — ED Notes (Signed)
Parents in room to visit

## 2013-11-22 NOTE — ED Notes (Signed)
Portable cd player has been borrowed from [facility].

## 2013-11-22 NOTE — ED Notes (Signed)
Patient is yelling "stop Nancy [name], don't do it". She also says that someone name Nancy [name] has been doing things to her. "No Pete".

## 2013-11-22 NOTE — ED Notes (Signed)
Pt is asleep. Caregiver at bedside.

## 2013-11-22 NOTE — ED Notes (Signed)
Patient belongings are at the bedside with the family, RN has meds at nursing station.

## 2013-11-22 NOTE — ED Notes (Signed)
[place] [facility] at [place].

## 2013-11-22 NOTE — ED Notes (Signed)
Bed: [facility]29 Expected date:  Expected time:  Means of arrival:  Comments: TCU 

## 2013-11-22 NOTE — ED Notes (Addendum)
Pt becoming more agitated. Yelling out. Pushing nurses hand away when trying to administer meds. Personal aide states she is becoming more like how she was yesterday when she came in and she became combative yesterday. Freida BusmanAllen, EDP made aware. Orders received. Waiting for TTS evaluation.

## 2013-11-22 NOTE — ED Notes (Signed)
Dr. Shela CommonsJ and team at bedside for evaluation.

## 2013-11-23 ENCOUNTER — Encounter (HOSPITAL_COMMUNITY): Payer: Self-pay | Admitting: Psychiatry

## 2013-11-23 DIAGNOSIS — F22 Delusional disorders: Secondary | ICD-10-CM | POA: Diagnosis not present

## 2013-11-23 DIAGNOSIS — F29 Unspecified psychosis not due to a substance or known physiological condition: Secondary | ICD-10-CM | POA: Diagnosis present

## 2013-11-23 MED ORDER — RISPERIDONE 0.5 MG PO TABS: 0.5000 mg | ORAL_TABLET | Freq: Every day | ORAL | Status: DC

## 2013-11-23 MED ORDER — CIPROFLOXACIN HCL 500 MG PO TABS: 500.0000 mg | ORAL_TABLET | Freq: Two times a day (BID) | ORAL | Status: DC

## 2013-11-23 MED ORDER — CIPROFLOXACIN HCL 500 MG PO TABS
500.0000 mg | ORAL_TABLET | Freq: Two times a day (BID) | ORAL | Status: DC
  Administered 2013-11-23: 500 mg via ORAL
  Filled 2013-11-23: qty 1

## 2013-11-23 NOTE — [facility] Suicide Risk Assessment (Signed)
Suicide Risk Assessment  Discharge Assessment     Demographic Factors:  Adolescent or young adult  Total Time spent with patient: 20 minutes  Psychiatric Specialty Exam:     Blood pressure 120/68, pulse 105, temperature 98.5 F (36.9 C), temperature source Axillary, resp. rate 20, SpO2 98.00%.There is no weight on file to calculate BMI.  General Appearance: Casual  Eye Contact::  Fair  Speech:  Garbled, related to speech impairment  Volume:  Normal  Mood:  Depressed  Affect:  Appropriate  Thought Process:  Circumstantial and Goal Directed  Orientation:  Full (Time, Place, and Person)  Thought Content:  Hears voices at times but improved  Suicidal Thoughts:  No  Homicidal Thoughts:  No  Memory:  Immediate;   Fair Recent;   Fair Remote;   Fair  Judgement:  Fair  Insight:  Lacking  Psychomotor Activity:  Decreased  Concentration:  Fair  Recall:  [employer]  Fund of Knowledge:Fair  Language: Fair  Akathisia:  No  Handed:  Right  AIMS (if indicated):     Assets:  Desire for Improvement Housing Resilience Social Support  Sleep:       Musculoskeletal: Strength & Muscle Tone: CP Gait & Station: unable to stand Patient leans: Right   Mental Status Per Nursing Assessment::   On Admission:     Current Mental Status by Physician: NA  Loss Factors: NA  Historical Factors: NA  Risk Reduction Factors:   Living with another person, especially a relative, Positive social support and Positive coping skills or problem solving skills  Continued Clinical Symptoms:  Epilepsy  Cognitive Features That Contribute To Risk:  None  Suicide Risk:  Minimal: No identifiable suicidal ideation.  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 CP (cerebral palsy)     with mild contracture  . MR (mental retardation)   . Mood disorder   . Peripheral vascular disease 10/02/2006    bilateral DVT lower legs  . Wheelchair bound     r/t CP  . Seizures     childhood  . Menorrhagia with regular cycle 11/11/2012  . Mobility impaired 11/11/2012  . IUD (intrauterine device) in place 10/2012  . Anemia     h/o iron deficiency   AXIS IV:  none AXIS V:  61-70 mild symptoms  Plan Of Care/Follow-up recommendations:  Activity:  as tolerated Diet:  heart healthy diet  Is patient on multiple antipsychotic therapies at discharge:  No   Has Patient had three or more failed trials of antipsychotic monotherapy by history:  No  Recommended Plan for Multiple Antipsychotic Therapies: NA    LORD, JAMISON, PMH-NP 11/23/2013, 11:24 AM

## 2013-11-23 NOTE — ED Notes (Signed)
Patient is resting comfortably. 

## 2013-11-23 NOTE — ED Notes (Signed)
Psych team at bedside .

## 2013-11-23 NOTE — ED Notes (Signed)
Patient is resting comfortably. Breathing WNL,  caregiver at bedside.

## 2013-11-23 NOTE — ED Notes (Signed)
Caregiver voiced understanding of instructions given

## 2013-11-23 NOTE — ED Notes (Signed)
Caregiver feeding pt applesauce

## 2013-11-23 NOTE — Consult Note (Signed)
[facility] Consult   Reason for Consult:  Paranoia Referring Physician:  EDP  Nancy [name] is an 23 y.o. female. 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 CP (cerebral palsy)     with mild contracture  . MR (mental retardation)   . Mood disorder   . Peripheral vascular disease 10/02/2006    bilateral DVT lower legs  . Wheelchair bound     r/t CP  . Seizures     childhood  . Menorrhagia with regular cycle 11/11/2012  . Mobility impaired 11/11/2012  . IUD (intrauterine device) in place 10/2012  . Anemia     h/o iron deficiency   AXIS IV:  other psychosocial or environmental problems AXIS V:  51-60 moderate symptoms  Plan:  Discharge home to her 24 caregiver with Risperdal for her psychosis and Cipro for her UTI Rx, follow-up with an outpatient psychiatrist.  Dr. De Nurse has assessed the patient and concurs with this plan.  Subjective:   Nancy [name] is a 24 y.o. female patient admitted.  HPI:  Patient calm on assessment reports hearing voices at times but improved.  She was hearing the voices of her grandfather and mother.  Her full-time caregiver was at her bedside and stated her behavior has changed over the past 1 1/2 weeks with screaming for no apparent reason, singing gospel than switching to cursing.  She brought her to the ED on Friday, Risperdal started for her psychosis and Cipro for her UTI.  The caregiver feels she can be discharged and she will take her to a follow-up appointment with a psychiatrist.   Past Psychiatric History: Past Medical History  Diagnosis Date  . CP (cerebral palsy)     with mild contracture  . MR (mental retardation)   . Mood disorder   . Peripheral vascular disease 10/02/2006    bilateral DVT lower legs  . Wheelchair bound     r/t CP  . Seizures     childhood  . Menorrhagia with  regular cycle 11/11/2012  . Mobility impaired 11/11/2012  . IUD (intrauterine device) in place 10/2012  . Anemia     h/o iron deficiency    reports that she has never smoked. She has never used smokeless tobacco. She reports that she does not drink alcohol or use illicit drugs. Family History  Problem Relation Age of Onset  . Cancer Mother   . Diabetes Father   . Alzheimer's disease Maternal Grandfather   . Heart attack Paternal Grandmother     Died between 46 or 68 years old   Family History Substance Abuse: No Family Supports: No Living Arrangements: Other (Comment) (Group Home) Can pt return to current living arrangement?: Yes Abuse/Neglect [facility]) Physical Abuse: Denies Verbal Abuse: Denies Sexual Abuse: Denies Allergies:  No Known Allergies  ACT Assessment Complete:  Yes:    Educational Status    Risk to Self: Risk to self Suicidal Ideation: No Suicidal Intent: No Is patient at risk for suicide?: No Suicidal Plan?: No Access to Means: No What has been your use of drugs/alcohol within the last 12 months?: None Previous Attempts/Gestures: No How many times?: 0 Other Self Harm Risks: None Triggers for Past Attempts: None known Intentional Self Injurious Behavior: None Family Suicide History: No Persecutory voices/beliefs?: No Depression: No Substance abuse history and/or treatment for substance abuse?:  No  Risk to Others: Risk to Others Homicidal Ideation: No Thoughts of Harm to Others: No Current Homicidal Intent: No Current Homicidal Plan: No Access to Homicidal Means: No Identified Victim: None History of harm to others?: No Assessment of Violence: None Noted Violent Behavior Description: None Does patient have access to weapons?: No Criminal Charges Pending?: No Does patient have a court date: No  Abuse: Abuse/Neglect Assessment (Assessment to be complete while patient is alone) Physical Abuse: Denies Verbal Abuse: Denies Sexual Abuse: Denies Exploitation  of patient/patient's resources: Denies Self-Neglect: Denies  Prior Inpatient Therapy: Prior Inpatient Therapy Prior Inpatient Therapy: No  Prior Outpatient Therapy: Prior Outpatient Therapy Prior Outpatient Therapy: No  Additional Information: Additional Information 1:1 In Past 12 Months?: Yes CIRT Risk: No Elopement Risk: No Does patient have medical clearance?: Yes    Objective: Blood pressure 120/68, pulse 105, temperature 98.5 F (36.9 C), temperature source Axillary, resp. rate 20, SpO2 98.00%.There is no weight on file to calculate BMI. Results for orders placed during the hospital encounter of 11/21/13 (from the past 72 hour(s))  CBC WITH DIFFERENTIAL     Status: Abnormal   Collection Time    11/21/13  5:39 PM      Result Value Ref Range   WBC 7.3  4.0 - 10.5 K/uL   RBC 5.10  3.87 - 5.11 MIL/uL   Hemoglobin 16.4 (*) 12.0 - 15.0 g/dL   HCT 45.9  36.0 - 46.0 %   MCV 90.0  78.0 - 100.0 fL   MCH 32.2  26.0 - 34.0 pg   MCHC 35.7  30.0 - 36.0 g/dL   RDW 12.9  11.5 - 15.5 %   Platelets 274  150 - 400 K/uL   Neutrophils Relative % 56  43 - 77 %   Neutro Abs 4.1  1.7 - 7.7 K/uL   Lymphocytes Relative 30  12 - 46 %   Lymphs Abs 2.2  0.7 - 4.0 K/uL   Monocytes Relative 13 (*) 3 - 12 %   Monocytes Absolute 0.9  0.1 - 1.0 K/uL   Eosinophils Relative 1  0 - 5 %   Eosinophils Absolute 0.1  0.0 - 0.7 K/uL   Basophils Relative 1  0 - 1 %   Basophils Absolute 0.0  0.0 - 0.1 K/uL  COMPREHENSIVE METABOLIC PANEL     Status: Abnormal   Collection Time    11/21/13  5:39 PM      Result Value Ref Range   Sodium 141  137 - 147 mEq/L   Potassium 3.4 (*) 3.7 - 5.3 mEq/L   Chloride 99  96 - 112 mEq/L   CO2 25  19 - 32 mEq/L   Glucose, Bld 105 (*) 70 - 99 mg/dL   BUN 5 (*) 6 - 23 mg/dL   Creatinine, Ser 0.51  0.50 - 1.10 mg/dL   Calcium 10.1  8.4 - 10.5 mg/dL   Total Protein 8.3  6.0 - 8.3 g/dL   Albumin 4.4  3.5 - 5.2 g/dL   AST 52 (*) 0 - 37 U/L   ALT 26  0 - 35 U/L   Alkaline  Phosphatase 79  39 - 117 U/L   Total Bilirubin 1.0  0.3 - 1.2 mg/dL   GFR calc non Af Amer >90  >90 mL/min   GFR calc Af Amer >90  >90 mL/min   Comment: (NOTE)     The eGFR has been calculated using the CKD EPI equation.  This calculation has not been validated in all clinical situations.     eGFR's persistently <90 mL/min signify possible Chronic Kidney     Disease.  ACETAMINOPHEN LEVEL     Status: None   Collection Time    11/21/13  5:39 PM      Result Value Ref Range   Acetaminophen (Tylenol), Serum <15.0  10 - 30 ug/mL   Comment:            THERAPEUTIC CONCENTRATIONS VARY     SIGNIFICANTLY. A RANGE OF 10-30     ug/mL MAY BE AN EFFECTIVE     CONCENTRATION FOR MANY PATIENTS.     HOWEVER, SOME ARE BEST TREATED     AT CONCENTRATIONS OUTSIDE THIS     RANGE.     ACETAMINOPHEN CONCENTRATIONS     >150 ug/mL AT 4 HOURS AFTER     INGESTION AND >50 ug/mL AT 12     HOURS AFTER INGESTION ARE     OFTEN ASSOCIATED WITH TOXIC     REACTIONS.  SALICYLATE LEVEL     Status: Abnormal   Collection Time    11/21/13  5:39 PM      Result Value Ref Range   Salicylate Lvl <9.3 (*) 2.8 - 20.0 mg/dL  URINALYSIS, ROUTINE W REFLEX MICROSCOPIC     Status: Abnormal   Collection Time    11/21/13  7:04 PM      Result Value Ref Range   Color, Urine AMBER (*) YELLOW   Comment: BIOCHEMICALS MAY BE AFFECTED BY COLOR   APPearance CLOUDY (*) CLEAR   Specific Gravity, Urine 1.025  1.005 - 1.030   pH 5.5  5.0 - 8.0   Glucose, UA NEGATIVE  NEGATIVE mg/dL   Hgb urine dipstick SMALL (*) NEGATIVE   Bilirubin Urine MODERATE (*) NEGATIVE   Ketones, ur 40 (*) NEGATIVE mg/dL   Protein, ur NEGATIVE  NEGATIVE mg/dL   Urobilinogen, UA 1.0  0.0 - 1.0 mg/dL   Nitrite NEGATIVE  NEGATIVE   Leukocytes, UA NEGATIVE  NEGATIVE  URINE MICROSCOPIC-ADD ON     Status: Abnormal   Collection Time    11/21/13  7:04 PM      Result Value Ref Range   Squamous Epithelial / LPF RARE  RARE   WBC, UA 3-6  <3 WBC/hpf   RBC / HPF  0-2  <3 RBC/hpf   Bacteria, UA MANY (*) RARE   Urine-Other MUCOUS PRESENT     Labs are reviewed and are pertinent for see CBC and Urinalysis.  Medications review no changes made to home medications. Patient was ordered Risperdal 0.5 mg now and hs Urinalysis    Component Value Date/Time   COLORURINE AMBER* 11/21/2013 1904   APPEARANCEUR CLOUDY* 11/21/2013 1904   LABSPEC 1.025 11/21/2013 1904   PHURINE 5.5 11/21/2013 1904   GLUCOSEU NEGATIVE 11/21/2013 1904   HGBUR SMALL* 11/21/2013 1904   BILIRUBINUR MODERATE* 11/21/2013 1904   KETONESUR 40* 11/21/2013 1904   PROTEINUR NEGATIVE 11/21/2013 1904   UROBILINOGEN 1.0 11/21/2013 1904   NITRITE NEGATIVE 11/21/2013 1904   LEUKOCYTESUR NEGATIVE 11/21/2013 1904      Current Facility-Administered Medications  Medication Dose Route Frequency Provider Last Rate Last Dose  . ciprofloxacin (CIPRO) tablet 500 mg  500 mg Oral BID Waylan Boga, NP      . clonazePAM Bobbye [place]) tablet 0.5 mg  0.5 mg Oral TID PRN Montine Circle, PA-C   0.5 mg at 11/22/13 1735  . clonazePAM (KLONOPIN) tablet 1 mg  1 mg Oral q morning - 10a Montine Circle, PA-C   1 mg at 11/23/13 1035  . glycopyrrolate (ROBINUL) tablet 1 mg  1 mg Oral BID Leota Jacobsen, MD   1 mg at 11/23/13 1035  . risperiDONE (RISPERDAL) tablet 0.5 mg  0.5 mg Oral QHS Shuvon Rankin, NP      . zolpidem (AMBIEN) tablet 5 mg  5 mg Oral QHS PRN Montine Circle, PA-C   5 mg at 11/22/13 0121   Current Outpatient Prescriptions  Medication Sig Dispense Refill  . clonazePAM (KLONOPIN) 1 MG tablet Take 1 mg by mouth every morning.      . clonazePAM (KLONOPIN) 1 MG tablet Take 0.5 mg by mouth 3 (three) times daily as needed for anxiety.      Marland Kitchen glycopyrrolate (ROBINUL) 1 MG tablet Take 1 tablet (1 mg total) by mouth 2 (two) times daily.  60 tablet  3  . ketoconazole (NIZORAL) 2 % shampoo Apply 1 application topically 2 (two) times daily as needed for irritation.        Psychiatric Specialty Exam:     Blood  pressure 120/68, pulse 105, temperature 98.5 F (36.9 C), temperature source Axillary, resp. rate 20, SpO2 98.00%.There is no weight on file to calculate BMI.  General Appearance: Casual  Eye Contact::  Fair  Speech:  Garbled and Related to speech impairment   Volume:  Normal  Mood:  Depressed  Affect:  Appropriate  Thought Process:  Circumstantial and Goal Directed  Orientation:  Full (Time, Place, and Person)  Thought Content: Voices at time but improved  Suicidal Thoughts:  No  Homicidal Thoughts:  No  Memory:  Immediate;   Fair Recent;   Fair  Judgement:  Fair  Insight:  Fair  Psychomotor Activity:  Decreased  Concentration:  Fair  Recall:  [employer] of Knowledge:Fair  Language: Fair  Akathisia:  No  Handed:  Right  AIMS (if indicated):     Assets:  Desire for Improvement Housing  Sleep:      Musculoskeletal: Strength & Muscle Tone: CP Gait & Station: normal, Patient lying in bed.  wheel chair bound Patient leans: Patient lying in bed  Treatment Plan Summary: Risperdal started BID for psychosis and Ciprofloxacin for UTI.  Discharge home to caregiver and follow-up with outpatient psychiatry.   Waylan Boga, [place] 11/23/2013 11:04 AM  I have personally seen the patient and agreed with the findings and involved in the treatment plan. Mood better and denies suicidal toughts. Can follow with outpatient. Has care giver who concurs with plan also. Merian Capron, MD

## 2013-11-25 ENCOUNTER — Telehealth: Payer: Self-pay | Admitting: Family Medicine

## 2013-11-25 NOTE — ED Provider Notes (Signed)
Medical screening examination/treatment/procedure(s) were performed by non-physician practitioner and as supervising physician I was immediately available for consultation/collaboration.   EKG Interpretation None        Biance Moncrief J. Letizia Hook, MD 11/25/13 1518 

## 2013-11-25 NOTE — Telephone Encounter (Signed)
Yes for hospital discharge

## 2013-11-25 NOTE — Telephone Encounter (Signed)
Message copied by Ricard DillonWILLIS, SANDY B on Wed Nov 25, 2013  2:18 PM ------      Message from: Harriet MassonOBERTS, CHELSEA N      Created: Wed Nov 25, 2013 11:40 AM      Regarding: Care giver updates      Contact: [phone]       Her care giver is calling to let us know that the pt      Went to ER Saturday was released Monday      Treated for UTI and dehydration      Put her on ciprofloxacin (CIPRO) 500 MG tablet and      risperiDONE (RISPERDAL) 0.5 MG tablet      No other meds were changed      Does the pt need to follow up from visit??       ------

## 2013-11-27 NOTE — Telephone Encounter (Signed)
Care giver aware and appt made

## 2013-12-03 ENCOUNTER — Encounter (HOSPITAL_COMMUNITY): Payer: Self-pay | Admitting: Emergency Medicine

## 2013-12-03 ENCOUNTER — Emergency Department (HOSPITAL_COMMUNITY)
Admission: EM | Admit: 2013-12-03 | Discharge: 2013-12-06 | Disposition: A | Discharge status: Home / Self Care | Payer: Medicare Other | Attending: Emergency Medicine | Admitting: Emergency Medicine

## 2013-12-03 DIAGNOSIS — Z79899 Other long term (current) drug therapy: Secondary | ICD-10-CM | POA: Diagnosis not present

## 2013-12-03 DIAGNOSIS — IMO0002 Reserved for concepts with insufficient information to code with codable children: Secondary | ICD-10-CM | POA: Insufficient documentation

## 2013-12-03 DIAGNOSIS — D649 Anemia, unspecified: Secondary | ICD-10-CM | POA: Diagnosis not present

## 2013-12-03 DIAGNOSIS — G253 Myoclonus: Secondary | ICD-10-CM

## 2013-12-03 DIAGNOSIS — Z3202 Encounter for pregnancy test, result negative: Secondary | ICD-10-CM | POA: Diagnosis not present

## 2013-12-03 DIAGNOSIS — G40309 Generalized idiopathic epilepsy and epileptic syndromes, not intractable, without status epilepticus: Secondary | ICD-10-CM | POA: Diagnosis not present

## 2013-12-03 DIAGNOSIS — R69 Illness, unspecified: Secondary | ICD-10-CM | POA: Insufficient documentation

## 2013-12-03 DIAGNOSIS — K5900 Constipation, unspecified: Secondary | ICD-10-CM | POA: Insufficient documentation

## 2013-12-03 DIAGNOSIS — F911 Conduct disorder, childhood-onset type: Secondary | ICD-10-CM | POA: Insufficient documentation

## 2013-12-03 DIAGNOSIS — F29 Unspecified psychosis not due to a substance or known physiological condition: Secondary | ICD-10-CM

## 2013-12-03 DIAGNOSIS — Z87440 Personal history of urinary (tract) infections: Secondary | ICD-10-CM | POA: Diagnosis not present

## 2013-12-03 DIAGNOSIS — F1310 Sedative, hypnotic or anxiolytic abuse, uncomplicated: Secondary | ICD-10-CM | POA: Insufficient documentation

## 2013-12-03 DIAGNOSIS — G4700 Insomnia, unspecified: Secondary | ICD-10-CM | POA: Diagnosis not present

## 2013-12-03 DIAGNOSIS — Z993 Dependence on wheelchair: Secondary | ICD-10-CM | POA: Diagnosis not present

## 2013-12-03 DIAGNOSIS — M4150 Other secondary scoliosis, site unspecified: Secondary | ICD-10-CM

## 2013-12-03 DIAGNOSIS — N920 Excessive and frequent menstruation with regular cycle: Secondary | ICD-10-CM

## 2013-12-03 DIAGNOSIS — M24549 Contracture, unspecified hand: Secondary | ICD-10-CM | POA: Insufficient documentation

## 2013-12-03 DIAGNOSIS — Z86718 Personal history of other venous thrombosis and embolism: Secondary | ICD-10-CM | POA: Diagnosis not present

## 2013-12-03 DIAGNOSIS — F22 Delusional disorders: Secondary | ICD-10-CM | POA: Diagnosis not present

## 2013-12-03 DIAGNOSIS — F79 Unspecified intellectual disabilities: Secondary | ICD-10-CM | POA: Diagnosis not present

## 2013-12-03 DIAGNOSIS — G808 Other cerebral palsy: Secondary | ICD-10-CM

## 2013-12-03 DIAGNOSIS — F39 Unspecified mood [affective] disorder: Secondary | ICD-10-CM | POA: Diagnosis not present

## 2013-12-03 DIAGNOSIS — W050XXA Fall from non-moving wheelchair, initial encounter: Secondary | ICD-10-CM

## 2013-12-03 DIAGNOSIS — Z7409 Other reduced mobility: Secondary | ICD-10-CM

## 2013-12-03 DIAGNOSIS — E876 Hypokalemia: Secondary | ICD-10-CM | POA: Insufficient documentation

## 2013-12-03 DIAGNOSIS — Z008 Encounter for other general examination: Secondary | ICD-10-CM | POA: Diagnosis present

## 2013-12-03 DIAGNOSIS — Z9181 History of falling: Secondary | ICD-10-CM | POA: Insufficient documentation

## 2013-12-03 DIAGNOSIS — F70 Mild intellectual disabilities: Secondary | ICD-10-CM

## 2013-12-03 LAB — CBC
HEMATOCRIT: 43.1 % (ref 36.0–46.0)
Hemoglobin: 15.1 g/dL — ABNORMAL HIGH (ref 12.0–15.0)
MCH: 32.2 pg (ref 26.0–34.0)
MCHC: 35 g/dL (ref 30.0–36.0)
MCV: 91.9 fL (ref 78.0–100.0)
Platelets: 276 10*3/uL (ref 150–400)
RBC: 4.69 MIL/uL (ref 3.87–5.11)
RDW: 12.8 % (ref 11.5–15.5)
WBC: 7.4 10*3/uL (ref 4.0–10.5)

## 2013-12-03 LAB — COMPREHENSIVE METABOLIC PANEL
ALT: 12 U/L (ref 0–35)
AST: 16 U/L (ref 0–37)
Albumin: 4 g/dL (ref 3.5–5.2)
Alkaline Phosphatase: 64 U/L (ref 39–117)
BILIRUBIN TOTAL: 0.4 mg/dL (ref 0.3–1.2)
BUN: 5 mg/dL — AB (ref 6–23)
CHLORIDE: 101 meq/L (ref 96–112)
CO2: 23 meq/L (ref 19–32)
Calcium: 9.9 mg/dL (ref 8.4–10.5)
Creatinine, Ser: 0.38 mg/dL — ABNORMAL LOW (ref 0.50–1.10)
GFR calc Af Amer: >90 mL/min (ref >90)
GFR calc non Af Amer: >90 mL/min (ref >90)
Glucose, Bld: 120 mg/dL — ABNORMAL HIGH (ref 70–99)
Potassium: 3.4 mEq/L — ABNORMAL LOW (ref 3.7–5.3)
Sodium: 138 mEq/L (ref 137–147)
Total Protein: 7.7 g/dL (ref 6.0–8.3)

## 2013-12-03 LAB — URINE MICROSCOPIC-ADD ON

## 2013-12-03 LAB — URINALYSIS, ROUTINE W REFLEX MICROSCOPIC
Glucose, UA: NEGATIVE mg/dL
KETONES UR: NEGATIVE mg/dL
LEUKOCYTES UA: NEGATIVE
NITRITE: NEGATIVE
PH: 5.5 (ref 5.0–8.0)
PROTEIN: NEGATIVE mg/dL
Specific Gravity, Urine: 1.027 (ref 1.005–1.030)
Urobilinogen, UA: 0.2 mg/dL (ref 0.0–1.0)

## 2013-12-03 LAB — RAPID URINE DRUG SCREEN, HOSP PERFORMED
AMPHETAMINES: NOT DETECTED
BARBITURATES: NOT DETECTED
Benzodiazepines: POSITIVE — AB
Cocaine: NOT DETECTED
Opiates: NOT DETECTED
Tetrahydrocannabinol: NOT DETECTED

## 2013-12-03 LAB — ACETAMINOPHEN LEVEL

## 2013-12-03 LAB — PREGNANCY, URINE: Preg Test, Ur: NEGATIVE

## 2013-12-03 LAB — ETHANOL

## 2013-12-03 LAB — SALICYLATE LEVEL: Salicylate Lvl: <2 mg/dL — ABNORMAL LOW (ref 2.8–20.0)

## 2013-12-03 NOTE — ED Provider Notes (Signed)
CSN: [id number]     Arrival date & time 12/03/13  1850 History   First MD Initiated Contact with Patient 12/03/13 1853     Chief Complaint  Patient presents with  . Medical Clearance  . Constipation     (Consider location/radiation/quality/duration/timing/severity/associated sxs/prior Treatment) The history is provided by the patient, a relative and a caregiver.  Nancy [name] is a 23 year old female with past medical history of mood disorder, mentally challenged, cerebral palsy, PVD, wheelchair-bound, and the neck presenting to the ED with aggressive behavior and agitation. Caregiver reported that approximately one week ago patient was brought in for delirium and aggression. Patient was seen and assessed by psychiatry where risperdal was started, 0.5 mg at night to help with sleep. Caregiver reported that on Tuesday patient did not sleep for 24 hours. Reported that on Wednesday patient finally fell asleep at 7:30-8:00 PM and woke up the next morning yelling and screaming, talking to herself. Reported that last night patient was calm, took her medications in a properly. Reported that at approximately 2:30 AM this morning patient started to yell, becoming progressively louder, spitting a medication, reported that patient had a bad day in group, reported that someone is coming after her at all times. Reported that she's each of the frustrated in that she's been feeling angry. Caregiver reported that patient has been talking to herself more frequently-switching from arguing as well as talking about faith. Caregiver reported that she's noticed patient having lost interest in things that used to love to do. Caregiver reported the patient is been eating and drinking well, normal bowel movements and urination. Denied fever, chills, chest pain, shortness of breath, difficulty breathing, abdominal pain, urinary symptoms, changes to bowel movement, homicidal ideation, suicidal ideation. PCP Dr.  Tanya Nones  Past Medical History  Diagnosis Date  . CP (cerebral palsy)     with mild contracture  . MR (mental retardation)   . Mood disorder   . Peripheral vascular disease 10/02/2006    bilateral DVT lower legs  . Wheelchair bound     r/t CP  . Seizures     childhood  . Menorrhagia with regular cycle 11/11/2012  . Mobility impaired 11/11/2012  . IUD (intrauterine device) in place 10/2012  . Anemia     h/o iron deficiency   Past Surgical History  Procedure Laterality Date  . Multiple tooth extractions  10/18/10  . Leg surgery      as a child  . Eye surgery      as a child  . Intrauterine device (iud) insertion N/A 11/12/2012    Procedure: INTRAUTERINE DEVICE (IUD) INSERTION;  Surgeon: Sherron Monday, MD;  Location: [facility];  Service: Gynecology;  Laterality: N/A;  . Hip surgery Right 2003   Family History  Problem Relation Age of Onset  . Cancer Mother   . Diabetes Father   . Alzheimer's disease Maternal Grandfather   . Heart attack Paternal Grandmother     Died between 59 or 33 years old   History  Substance Use Topics  . Smoking status: Never Smoker   . Smokeless tobacco: Never Used  . Alcohol Use: No     Comment: Pt denies   OB History   Grav Para Term Preterm Abortions TAB SAB Ect Mult Living                 Review of Systems  Constitutional: Negative for fever and chills.  Respiratory: Negative for chest tightness and shortness of breath.  Gastrointestinal: Negative for nausea, vomiting, abdominal pain, diarrhea, constipation, blood in stool and anal bleeding.  Psychiatric/Behavioral: Positive for behavioral problems, dysphoric mood and agitation. Negative for suicidal ideas, hallucinations, confusion and self-injury.  All other systems reviewed and are negative.     Allergies  Review of patient's allergies indicates no known allergies.  Home Medications   Current Outpatient Rx  Name  Route  Sig  Dispense  Refill  . clonazePAM (KLONOPIN) 1 MG tablet    Oral   Take 0.5 tablets (0.5 mg total) by mouth 3 (three) times daily as needed for anxiety.   30 tablet      . clonazePAM (KLONOPIN) 1 MG tablet   Oral   Take 1 tablet (1 mg total) by mouth every morning.   30 tablet      . glycopyrrolate (ROBINUL) 1 MG tablet   Oral   Take 1 tablet (1 mg total) by mouth 2 (two) times daily.   60 tablet   3   . ketoconazole (NIZORAL) 2 % shampoo   Topical   Apply 1 application topically 2 (two) times daily as needed for irritation.   120 mL      . risperiDONE (RISPERDAL) 0.5 MG tablet   Oral   Take 1 tablet (0.5 mg total) by mouth at bedtime.   60 tablet   0    BP 129/60  Pulse 105  Temp(Src) 98.5 F (36.9 C) (Oral)  Resp 20  SpO2 98% Physical Exam  Nursing note and vitals reviewed. Constitutional: She is oriented to person, place, and time. She appears well-developed and well-nourished. No distress.  HENT:  Head: Normocephalic and atraumatic.  Mouth/Throat: No oropharyngeal exudate.  Eyes: Conjunctivae and EOM are normal. Pupils are equal, round, and reactive to light. Right eye exhibits no discharge. Left eye exhibits no discharge.  Negative nystagmus Visual fields grossly intact  Neck: Normal range of motion. Neck supple. No tracheal deviation present.  Cardiovascular: Normal rate, regular rhythm and normal heart sounds.  Exam reveals no friction rub.   No murmur heard. Pulmonary/Chest: Effort normal and breath sounds normal. No respiratory distress. She has no wheezes. She has no rales.  Abdominal: Soft. Bowel sounds are normal. There is no tenderness. There is no guarding.  Musculoskeletal: Normal range of motion. She exhibits no edema and no tenderness.  Bilateral hand contractures  Lymphadenopathy:    She has no cervical adenopathy.  Neurological: She is alert and oriented to person, place, and time. No cranial nerve deficit. Coordination normal.  Skin: Skin is warm and dry. No rash noted. She is not diaphoretic. No  erythema.  Psychiatric: She has a normal mood and affect. Her behavior is normal. Thought content normal.  Patient calm Appears goal oriented    ED Course  Procedures (including critical care time)  10:42 PM Discussed case and reviewed labs with attending physician - labs re-assuring, recommended TTS consult to be performed and for patient to be assessed by psych.    Results for orders placed during the hospital encounter of 12/03/13  ACETAMINOPHEN LEVEL      Result Value Ref Range   Acetaminophen (Tylenol), Serum <15.0  10 - 30 ug/mL  CBC      Result Value Ref Range   WBC 7.4  4.0 - 10.5 K/uL   RBC 4.69  3.87 - 5.11 MIL/uL   Hemoglobin 15.1 (*) 12.0 - 15.0 g/dL   HCT 45.443.1  09.836.0 - 11.946.0 %   MCV 91.9  78.0 -  100.0 fL   MCH 32.2  26.0 - 34.0 pg   MCHC 35.0  30.0 - 36.0 g/dL   RDW 16.1  09.6 - 04.5 %   Platelets 276  150 - 400 K/uL  COMPREHENSIVE METABOLIC PANEL      Result Value Ref Range   Sodium 138  137 - 147 mEq/L   Potassium 3.4 (*) 3.7 - 5.3 mEq/L   Chloride 101  96 - 112 mEq/L   CO2 23  19 - 32 mEq/L   Glucose, Bld 120 (*) 70 - 99 mg/dL   BUN 5 (*) 6 - 23 mg/dL   Creatinine, Ser 4.09 (*) 0.50 - 1.10 mg/dL   Calcium 9.9  8.4 - 81.1 mg/dL   Total Protein 7.7  6.0 - 8.3 g/dL   Albumin 4.0  3.5 - 5.2 g/dL   AST 16  0 - 37 U/L   ALT 12  0 - 35 U/L   Alkaline Phosphatase 64  39 - 117 U/L   Total Bilirubin 0.4  0.3 - 1.2 mg/dL   GFR calc non Af Amer >90  >90 mL/min   GFR calc Af Amer >90  >90 mL/min  ETHANOL      Result Value Ref Range   Alcohol, Ethyl (B) <11  0 - 11 mg/dL  SALICYLATE LEVEL      Result Value Ref Range   Salicylate Lvl <2.0 (*) 2.8 - 20.0 mg/dL  URINE RAPID DRUG SCREEN (HOSP PERFORMED)      Result Value Ref Range   Opiates NONE DETECTED  NONE DETECTED   Cocaine NONE DETECTED  NONE DETECTED   Benzodiazepines POSITIVE (*) NONE DETECTED   Amphetamines NONE DETECTED  NONE DETECTED   Tetrahydrocannabinol NONE DETECTED  NONE DETECTED   Barbiturates  NONE DETECTED  NONE DETECTED  URINALYSIS, ROUTINE W REFLEX MICROSCOPIC      Result Value Ref Range   Color, Urine YELLOW  YELLOW   APPearance TURBID (*) CLEAR   Specific Gravity, Urine 1.027  1.005 - 1.030   pH 5.5  5.0 - 8.0   Glucose, UA NEGATIVE  NEGATIVE mg/dL   Hgb urine dipstick SMALL (*) NEGATIVE   Bilirubin Urine SMALL (*) NEGATIVE   Ketones, ur NEGATIVE  NEGATIVE mg/dL   Protein, ur NEGATIVE  NEGATIVE mg/dL   Urobilinogen, UA 0.2  0.0 - 1.0 mg/dL   Nitrite NEGATIVE  NEGATIVE   Leukocytes, UA NEGATIVE  NEGATIVE  PREGNANCY, URINE      Result Value Ref Range   Preg Test, Ur NEGATIVE  NEGATIVE  URINE MICROSCOPIC-ADD ON      Result Value Ref Range   Squamous Epithelial / LPF RARE  RARE   WBC, UA 3-6  <3 WBC/hpf   RBC / HPF 0-2  <3 RBC/hpf   Bacteria, UA MANY (*) RARE   Casts HYALINE CASTS (*) NEGATIVE   Urine-Other MUCOUS PRESENT       Labs Review Labs Reviewed  CBC - Abnormal; Notable for the following:    Hemoglobin 15.1 (*)    All other components within normal limits  COMPREHENSIVE METABOLIC PANEL - Abnormal; Notable for the following:    Potassium 3.4 (*)    Glucose, Bld 120 (*)    BUN 5 (*)    Creatinine, Ser 0.38 (*)    All other components within normal limits  SALICYLATE LEVEL - Abnormal; Notable for the following:    Salicylate Lvl <2.0 (*)    All other components within normal limits  URINE RAPID DRUG SCREEN (HOSP PERFORMED) - Abnormal; Notable for the following:    Benzodiazepines POSITIVE (*)    All other components within normal limits  ACETAMINOPHEN LEVEL  ETHANOL  URINALYSIS, ROUTINE W REFLEX MICROSCOPIC  PREGNANCY, URINE   Imaging Review No results found.   EKG Interpretation None      MDM   Final diagnoses:  None    Filed Vitals:   12/03/13 1906 12/03/13 2140  BP: 132/74 129/60  Pulse: 124 105  Temp: 98.9 F (37.2 C) 98.5 F (36.9 C)  TempSrc: Oral Oral  Resp: 18 20  SpO2: 97% 98%    Patient presenting to the ED by  family and caregiver regarding increased aggression and combative behavior. Reported that patient has been spitting her medications, yelling and progressively getting louder, not sleeping well, talking to herself as well as thoughts of someone coming after her. Reported that patient was seen and assessed in ED setting this week regarding delirium and aggression where patient was seen and assessed by psychiatry and started on Risperdal. Patient fully aware that she is agitated and reported that she has been feeling angry a lot lately. Denied HI and SI.  This provider reviewed patient's chart. Patient seen and assessed in ED setting on 11/21/2013 regarding aggressive behavior and delirium - similar presentation. Chest x-ray negative for acute cardiac pulmonary disease. CT head negative for acute intracranial abnormalities. Patient was seen and assessed by psychiatry and started on Risperdal. Patient was also diagnosed with urinary tract infection and was prescribed ciprofloxacin-last dose finished yesterday, 12/02/2013. Alert. Oriented x3. Heart rate and rhythm normal-negative tachycardia noted. Lungs clear to auscultation to upper and lower lobes bilaterally. Bowel sounds are normal active in all 4 quadrants, soft upon palpation with negative pain. Benign abdominal exam. Contractures noted to hands bilaterally. Dry skin noted to abdomen. Dry mucous membranes. Negative nystagmus, visual fields grossly intact. CBC negative elevation white blood cell count identified. CMP noted hypokalemia of 3.4. BUN 5, creatinine of 0.38. Acetaminophen negative elevation. Salicylate level negative elevation. Ethanol level negative elevation. Urine drug screen noted positive for benzodiazepines - patient currently on Klonopin. Urine pregnancy negative. Urinalysis noted small hemoglobin-negative nitrites and leukocytes identified-many bacteria with hyaline casts. Heart rate has decreased when compared to arrival - 124 bpm to 105  bpm. When compared to patient's visit on 11/21/2013 patient was tachycardic as well. Negative tachypnea. Pulse ox within normal limits.  Doubt encephalopathy. Doubt ICH. Doubt SAH. Doubt infectious process. Doubt PE. Patient aware that she is angry and easily agitated. Discussed case with attending physician who recommended patient to be evaluated by psych. Psych holding orders placed. Medications ordered. IV fluids to be administered - patient appears dehydrated.  Discussed case in great detail with Ivonne Andrew, PA-C - transfer of care to [facility]. Dammen, PA-C at change in shift.  Raymon Mutton, PA-C 12/04/13 1110
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
## 2013-12-04 DIAGNOSIS — F79 Unspecified intellectual disabilities: Secondary | ICD-10-CM

## 2013-12-04 DIAGNOSIS — F29 Unspecified psychosis not due to a substance or known physiological condition: Secondary | ICD-10-CM | POA: Diagnosis not present

## 2013-12-04 DIAGNOSIS — F39 Unspecified mood [affective] disorder: Secondary | ICD-10-CM | POA: Diagnosis not present

## 2013-12-04 MED ORDER — RISPERIDONE 2 MG PO TABS
2.0000 mg | ORAL_TABLET | Freq: Every day | ORAL | Status: DC
  Administered 2013-12-04: 2 mg via ORAL
  Filled 2013-12-04: qty 1

## 2013-12-04 MED ORDER — GLYCOPYRROLATE 1 MG PO TABS
1.0000 mg | ORAL_TABLET | Freq: Two times a day (BID) | ORAL | Status: DC
  Filled 2013-12-04 (×3): qty 1

## 2013-12-04 MED ORDER — RISPERIDONE 0.5 MG PO TABS: 0.5000 mg | ORAL_TABLET | Freq: Every day | ORAL | Status: DC

## 2013-12-04 MED ORDER — RISPERIDONE 1 MG PO TABS
1.0000 mg | ORAL_TABLET | Freq: Once | ORAL | Status: AC
  Administered 2013-12-04: 1 mg via ORAL
  Filled 2013-12-04: qty 1

## 2013-12-04 MED ORDER — SODIUM CHLORIDE 0.9 % IV BOLUS (SEPSIS)
1000.0000 mL | Freq: Once | INTRAVENOUS | Status: AC
  Administered 2013-12-04: 1000 mL via INTRAVENOUS

## 2013-12-04 MED ORDER — CLONAZEPAM 0.5 MG PO TABS: 1.0000 mg | ORAL_TABLET | Freq: Every morning | ORAL | Status: DC

## 2013-12-04 NOTE — ED Provider Notes (Signed)
Nancy MutterPeter S Lunette [name] 4:10AM patient discussed in sign out. Patient awaiting psychiatric evaluation with history of mental delay. Patient is wheelchair-bound. Has slight tachycardia with varying rates otherwise appears in no acute distress. Heart rate may be related to increased anxiety. Fluids given. She does have a history of slight tachycardia at past visits. No other concerning medical conditions present. She is medically cleared for psychiatric evaluation.  Angus Sellereter S Rodrigues Urbanek, PA-C 12/04/13 2134

## 2013-12-04 NOTE — Progress Notes (Signed)
CSW received a call from the patient's father requesting the opportunity to visit the patient the same time the mother is in the room.  He reports that on nightshift they were able to visit at the same time.  CSW spoke with the charge nursing and the attending nursing Cordelia PenSherry it was agreed that the patient have 2 visitors.  CSW escorted the father to the room.     Maryelizabeth Rowanressa Dameian Crisman, MSW, [place]st[place]CSWA, 12/04/2013 Evening Clinical Social Worker [phone]

## 2013-12-04 NOTE — BH Assessment (Signed)
[facility] Assessment Progress Note  Called and scheduled pt's tele assessment with this clinician @ 0745. Talked with EDP Nanavati to gather clinical information for the pt at this time as well, but he stated he has not seen the pt.  Will call back with disposition.  Casimer LaniusKristen Nolin Grell, MS, [facility] Licensed Professional Counselor Triage Specialist

## 2013-12-04 NOTE — ED Provider Notes (Signed)
Medical screening examination/treatment/procedure(s) were performed by non-physician practitioner and as supervising physician I was immediately available for consultation/collaboration.   EKG Interpretation None       Raeford RazorStephen Telesforo Brosnahan, MD 12/04/13 [phone]
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
## 2013-12-04 NOTE — Progress Notes (Signed)
Nancy [name] with [facility] has completed her evaluation and agrees with the decision for inpatient hospitalization. CSW called but only able to leave a message for Care Coordinator to obtain the psychological needed for review at other hospitals.      [name], MSW, [place]CS[place], 12/04/2013 Evening Clinical Social Worker [phone]

## 2013-12-04 NOTE — Progress Notes (Signed)
Attempted placement at the following facilities:   [name] because she is medically inappropriate for their unit.    [facility], now awaiting a return call    Maryelizabeth Rowanressa Hicks Feick, MSW, [place] City[place]CS[place], 12/04/2013 Evening Clinical Social Worker [phone]

## 2013-12-04 NOTE — ED Notes (Signed)
Pt's caregiver is now at bedside with pt.

## 2013-12-04 NOTE — ED Notes (Addendum)
Nancy [name] [phone], caregiver wants to be notified if there is a change in status. Will come back later to check on pt
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
## 2013-12-04 NOTE — ED Notes (Signed)
Caregiver requested hot water and supplies to give pt bath, which was provided

## 2013-12-04 NOTE — ED Notes (Addendum)
Please call pt's father Danice Goltzheo [phone] with any updates or changes in status
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
## 2013-12-04 NOTE — Progress Notes (Signed)
[name] At this time no female beds but will review. Pt denied due to WC bound and cerbral palsy.  [facility] completed. CSW also trying to obtain psychological to complete referral [facility] CSW left message for pt care coordinator at [phone] and [phone]. Patient to be evaluated by  [facility] as required by [facility] for referral after 3pm.    Byrd HesselbachKristen Shannen Vernon, LCSW [phone]  ED CSW 12/04/2013 1411pm
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
## 2013-12-05 ENCOUNTER — Encounter (HOSPITAL_COMMUNITY): Payer: Self-pay | Admitting: Psychiatry

## 2013-12-05 DIAGNOSIS — F29 Unspecified psychosis not due to a substance or known physiological condition: Secondary | ICD-10-CM | POA: Diagnosis not present

## 2013-12-05 LAB — URINE CULTURE
CULTURE: NO GROWTH
Colony Count: NO GROWTH

## 2013-12-05 MED ORDER — LORAZEPAM 1 MG PO TABS
1.0000 mg | ORAL_TABLET | Freq: Once | ORAL | Status: AC
  Administered 2013-12-05: 1 mg via ORAL
  Filled 2013-12-05: qty 1

## 2013-12-05 MED ORDER — POTASSIUM CHLORIDE 20 MEQ/15ML (10%) PO LIQD
10.0000 meq | Freq: Every day | ORAL | Status: DC
  Administered 2013-12-06: 10 meq via ORAL
  Filled 2013-12-05 (×3): qty 15

## 2013-12-05 MED ORDER — BENZTROPINE MESYLATE 1 MG PO TABS
0.5000 mg | ORAL_TABLET | Freq: Two times a day (BID) | ORAL | Status: DC
  Administered 2013-12-05 – 2013-12-06 (×3): 0.5 mg via ORAL
  Filled 2013-12-05 (×3): qty 1

## 2013-12-05 MED ORDER — RISPERIDONE 1 MG PO TABS
1.0000 mg | ORAL_TABLET | Freq: Every day | ORAL | Status: DC
  Administered 2013-12-05: 1 mg via ORAL
  Filled 2013-12-05: qty 1

## 2013-12-05 NOTE — ED Notes (Signed)
Attempted to feed pt, only wanted a couple sips of Sprite

## 2013-12-05 NOTE — ED Notes (Addendum)
Pt only taking small sips of sprite and one bit of applesauce

## 2013-12-05 NOTE — ED Notes (Signed)
Pt assisted to bedpan by 2 people.

## 2013-12-05 NOTE — ED Notes (Signed)
Patient resting in position of comfort with eyes closed RR WNL--even and unlabored with equal rise and fall of chest Patient in NAD Side rails up, call bell in reach  

## 2013-12-05 NOTE — ED Notes (Signed)
Pt cleaned up from incontinence and repositioned. Pt denies further needs at this time.

## 2013-12-05 NOTE — ED Notes (Signed)
Bed: WA14 Expected date:  Expected time:  Means of arrival:  Comments: 

## 2013-12-05 NOTE — ED Notes (Signed)
Assisted patient on bedpan.

## 2013-12-05 NOTE — ED Notes (Signed)
Father called to check  On pt.

## 2013-12-05 NOTE — ED Notes (Signed)
Pt was incontinent of stool. Two person assist was used to clean patient, provide clean linen and pads and reposition patient on side.

## 2013-12-05 NOTE — ED Notes (Signed)
Pt drinking cola and took medication with applesauce

## 2013-12-05 NOTE — ED Notes (Signed)
Pt ate 1 whole cup of applesauce.

## 2013-12-05 NOTE — ED Notes (Signed)
Pt incontinent of small stool. 2 person assist to provided incontinence care. Pt repositioned.

## 2013-12-05 NOTE — ED Notes (Signed)
Provided pt water per her request.

## 2013-12-05 NOTE — ED Notes (Signed)
Pt was fed and she ate meal on tray

## 2013-12-05 NOTE — ED Notes (Signed)
Psychiatry team at bedside
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
## 2013-12-06 ENCOUNTER — Encounter (HOSPITAL_COMMUNITY): Payer: Self-pay | Admitting: Registered Nurse

## 2013-12-06 DIAGNOSIS — F29 Unspecified psychosis not due to a substance or known physiological condition: Secondary | ICD-10-CM | POA: Diagnosis not present

## 2013-12-06 LAB — POTASSIUM: Potassium: 3.7 mEq/L (ref 3.7–5.3)

## 2013-12-06 MED ORDER — RISPERIDONE 1 MG PO TABS: 1.0000 mg | ORAL_TABLET | Freq: Every day | ORAL | Status: DC

## 2013-12-06 MED ORDER — BENZTROPINE MESYLATE 0.5 MG PO TABS: 0.5000 mg | ORAL_TABLET | Freq: Two times a day (BID) | ORAL | Status: DC
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
## 2013-12-06 NOTE — Discharge Instructions (Signed)
Paranoia  Paranoia is a distrust of others that is not based on a real reason for distrust. This may reach delusional levels. This means the paranoid person feels the world is against them when there is no reason to make them feel that way. People with paranoia feel as though people around them are "out to get them".   SIMILAR MENTAL ILNESSES  · Depression is a feeling as though you are down all the time. It is normal in some situations where you have just lost a loved one. It is abnormal if you are having feelings of paranoia with it.  · Dementia is a physical problem with the brain in which the brain no longer works properly. There are problems with daily activities of living. Alzheimer's disease is one example of this. Dementia is also caused by old age changes in the brain which come with the death of brain cells and small strokes.  · Paranoidschizophrenia. People with paranoid schizophrenia and persecutory delusional disorder have delusions in which they feel people around them are plotting against them. Persecutory delusions in paranoid schizophrenia are bizarre, sometimes grandiose, and often accompanied by auditory hallucinations. This means the person is hearing voices that are not there.  · Delusionaldisorder (persecutory type). Delusions experienced by individuals with delusional disorder are more believable than those experienced by paranoid schizophrenics; they are not bizarre, though still unjustified. Individuals with delusional disorder may seem offbeat or quirky rather than mentally ill, and therefore, may never seek treatment.  All of these problems usually do not allow these people to interact socially in an acceptable manner.  CAUSES  The cause of paranoia is often not known. It is common in people with extended abuse of:  · Cocaine.  · Amphetamine.  · Marijuana.  · Alcohol.  Sometimes there is an inherited tendency. It may be associated with stress or changes in brain chemistry.  DIAGNOSIS    When paranoia is present, your caregiver may:  · Refer you to a specialist.  · Do a physical exam.  · Perform other tests on you to make sure there are not other problems causing the paranoia including:  · Physical problems.  · Mental problems.  · Chemical problems (other than drugs).  Testing may be done to determine if there is a psychiatric disability present that can be treated with medicine.  TREATMENT   · Paranoia that is a symptom of a psychiatric problem should be treated by professionals.  · Medicines are available which can help this disorder. Antipsychotic medicine may be prescribed by your caregiver.  · Sometimes psychotherapy may be useful.  · Conditions such as depression or drug abuse are treated individually. If the paranoia is caused by drug abuse, a treatment facility may be helpful. Depression may be helped by antidepressants.  PROGNOSIS   · Paranoid people are difficult to treat because of their belief that everyone is out to get them or harm them. Because of this mistrust, they often must be talked into entering treatment by a trusted family member or friend. They may not want to take medicine as they may see this as an attempt to poison them.  · Gradual gains in the trust of a therapist or caregiver helps in a successful treatment plan.  · Some people with PPD or persecutory delusional disorder function in society without treatment in limited fashion.  Document Released: 08/16/2003 Document Revised: 11/05/2011 Document Reviewed: 04/20/2008  [employer]® Patient Information ©2014 [employer].

## 2013-12-06 NOTE — ED Notes (Signed)
Bed: WA28 Expected date:  Expected time:  Means of arrival:  Comments: TCU 

## 2013-12-06 NOTE — Progress Notes (Addendum)
CSW called pt's father and pt's caretaker to inform of d/c and tx recommendations.  CSW discussed recommendation for follow-up psychiatry. Caretaker has had trouble reaching [facility], where pt has already completed intake assessment. CSW called [facility] and LM re: follow up appointment. Caretaker planning to go there in person again tomorrow, and take pt with her. CSW also provided a list of other psychiatry resources.  Family and caretaker en route.  Rochele Pages,     ED CSW  phone: [phone] 11:55am ________________  CSW met in person with father and caretaker to discuss outpatient resources.  Pt has a care coordinator, [employer]. CSW left message for [facility] relaying [place] [facility] and hospital recommendation for follow up. CSW advised family on role of care coordinator and how they can assist in locating a provider.   Rochele Pages,     ED CSW  phone: [phone] 1:00pm

## 2013-12-07 ENCOUNTER — Ambulatory Visit (INDEPENDENT_AMBULATORY_CARE_PROVIDER_SITE_OTHER): Payer: Medicare Other | Admitting: Family Medicine

## 2013-12-07 ENCOUNTER — Encounter: Payer: Self-pay | Admitting: Family Medicine

## 2013-12-07 VITALS — BP 104/62 | HR 100 | Temp 99.4°F | Resp 22

## 2013-12-07 DIAGNOSIS — F29 Unspecified psychosis not due to a substance or known physiological condition: Secondary | ICD-10-CM | POA: Diagnosis not present

## 2013-12-07 DIAGNOSIS — Z09 Encounter for follow-up examination after completed treatment for conditions other than malignant neoplasm: Secondary | ICD-10-CM | POA: Diagnosis not present

## 2013-12-07 MED ORDER — ARIPIPRAZOLE 5 MG PO TABS: 5.0000 mg | ORAL_TABLET | Freq: Once | ORAL | Status: DC
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
## 2013-12-11 DIAGNOSIS — F259 Schizoaffective disorder, unspecified: Secondary | ICD-10-CM | POA: Diagnosis not present

## 2013-12-14 ENCOUNTER — Ambulatory Visit (INDEPENDENT_AMBULATORY_CARE_PROVIDER_SITE_OTHER): Payer: Medicare Other | Admitting: Family Medicine

## 2013-12-14 ENCOUNTER — Encounter: Payer: Self-pay | Admitting: Family Medicine

## 2013-12-14 VITALS — BP 119/64 | HR 78 | Temp 98.7°F | Resp 14

## 2013-12-14 DIAGNOSIS — K5900 Constipation, unspecified: Secondary | ICD-10-CM | POA: Diagnosis not present

## 2013-12-14 DIAGNOSIS — F259 Schizoaffective disorder, unspecified: Secondary | ICD-10-CM | POA: Diagnosis not present

## 2013-12-14 NOTE — Progress Notes (Signed)
Subjective:    Patient ID: Nancy [name], female    DOB: 12-21-1990, 23 y.o.   MRN: [id number]  Constipation  12/07/13 Patient presents today for hospital discharge follow up. She has been in the hospital on 2 separate occasions with psychosis, paranoia, delirium, agitation, and extreme anxiety. They have started the patient on risperdal 0.5 mg by mouth each bedtime.  During her last hospitalization this was increased to 1 mg by mouth each bedtime. This does not seem to be helping the patient all. When she takes a higher dose it leaves her overly sedated and almost catatonic.  However the lower doses did not help at the extreme paranoia and gets of her age. Over the last month she's had increasing sense of rage, she does not sleep at night, she goes days without sleeping, she has conversations with herself in the bed all night long. She hasn't had of rage where she passed out her hair and battles with caretakers.  Her caretakers state that she is having visual hallucinations at times. Baby has admitted hearing voices or dead grandfather. She is extremely tearful today in the exam room and will not answer my questions. She just continues to repeat "I want to go home," and crying.  At that time, my plan was: Patient has an outpatient appointment with psychiatry on May 14. I feel that this is too far off and I will try to speed this up. In the meantime I discontinued her risperdal and switched the patient to Abilify 5 mg by mouth each bedtime. Recheck the patient in 1 week and increase medication if necessary. I am concerned the patient is having psychosis due to mania/bipolar.    12/14/13 She is here today for followup..  she is doing much better. She is acting like herself again. She was diagnosed as these are affective disorder with bipolar tendencies. Her psychiatrist added trazodone to help her sleep at night 50 mg.  She is doing exceptionally well on this regimen.  Her only concern is  constipation. She's going to agree days without a bowel movement. She has a bowel movement they are hard and large. Past Medical History  Diagnosis Date  . CP (cerebral palsy)     with mild contracture  . MR (mental retardation)   . Mood disorder   . Peripheral vascular disease 10/02/2006    bilateral DVT lower legs  . Wheelchair bound     r/t CP  . Seizures     childhood  . Menorrhagia with regular cycle 11/11/2012  . Mobility impaired 11/11/2012  . IUD (intrauterine device) in place 10/2012  . Anemia     h/o iron deficiency   .sph Current Outpatient Prescriptions on File Prior to Visit  Medication Sig Dispense Refill  . ARIPiprazole (ABILIFY) 5 MG tablet Take 1 tablet (5 mg total) by mouth once.  30 tablet  2  . benztropine (COGENTIN) 0.5 MG tablet Take 1 tablet (0.5 mg total) by mouth 2 (two) times daily.  30 tablet  1  . clonazePAM (KLONOPIN) 1 MG tablet Take 0.5 tablets (0.5 mg total) by mouth 3 (three) times daily as needed for anxiety.  30 tablet    . clonazePAM (KLONOPIN) 1 MG tablet Take 1 tablet (1 mg total) by mouth every morning.  30 tablet    . glycopyrrolate (ROBINUL) 1 MG tablet Take 1 tablet (1 mg total) by mouth 2 (two) times daily.  60 tablet  3  . ketoconazole (NIZORAL) 2 %  shampoo Apply 1 application topically 2 (two) times daily as needed for irritation.  120 mL    . risperiDONE (RISPERDAL) 1 MG tablet Take 1 tablet (1 mg total) by mouth at bedtime.  30 tablet  1   No current facility-administered medications on file prior to visit.   No Known Allergies History   Social History  . Marital Status: Single    Spouse Name: N/A    Number of Children: N/A  . Years of Education: N/A   Occupational History  . Not on file.   Social History Main Topics  . Smoking status: Never Smoker   . Smokeless tobacco: Never Used  . Alcohol Use: No     Comment: Pt denies  . Drug Use: No     Comment: Pt denies  . Sexual Activity: No     Comment: lives in a group home.   single.   Other Topics Concern  . Not on file   Social History Narrative  . No narrative on file     Review of Systems  Gastrointestinal: Positive for constipation.  All other systems reviewed and are negative.      Objective:   Physical Exam  Vitals reviewed. Cardiovascular: Normal rate, regular rhythm and normal heart sounds.   Pulmonary/Chest: Effort normal and breath sounds normal. No respiratory distress. She has no wheezes.  Psychiatric: She has a normal mood and affect. Her speech is normal and behavior is normal. Thought content normal.          Assessment & Plan:  1. Schizoaffective disorder Continue Abilify 5 mg by mouth daily and trazodone 50 mg by mouth each bedtime. Recheck in 3 months. At that time get fasting lipid panel CBC and CMP. 2. Unspecified constipation Begin with fleets enema per rectum daily for the next 2 days. Continue MiraLax 17 g by mouth daily thereafter to prevent constipation unless she develops diarrhea

## 2013-12-16 NOTE — ED Provider Notes (Signed)
Medical screening examination/treatment/procedure(s) were performed by non-physician practitioner and as supervising physician I was immediately available for consultation/collaboration.   EKG Interpretation None       Raeford RazorStephen Cicley Ganesh, MD 12/16/13 1423

## 2013-12-30 DIAGNOSIS — F259 Schizoaffective disorder, unspecified: Secondary | ICD-10-CM | POA: Diagnosis not present

## 2014-01-01 ENCOUNTER — Telehealth: Payer: Self-pay | Admitting: *Deleted

## 2014-01-01 ENCOUNTER — Other Ambulatory Visit: Payer: Self-pay | Admitting: Family

## 2014-01-01 MED ORDER — CLONAZEPAM 1 MG PO TABS: 1.0000 mg | ORAL_TABLET | Freq: Every morning | ORAL | Status: DC

## 2014-01-01 NOTE — Telephone Encounter (Signed)
Nancy [name], the pt's caregiver, wanted clarification on why the pt needed a follow up appointment. The appt was made today for 03/11/14 with Dr. Sharene SkeansHickling. I spoke with Inetta Fermoina about it and said that this pt needed to be seen yearly because she is taking medications prescribed by Dr. Sharene SkeansHickling and need to follow up on her diagnoses. I explained this to [facility] and she understood.

## 2014-01-08 DIAGNOSIS — F29 Unspecified psychosis not due to a substance or known physiological condition: Secondary | ICD-10-CM | POA: Diagnosis not present

## 2014-01-15 DIAGNOSIS — F29 Unspecified psychosis not due to a substance or known physiological condition: Secondary | ICD-10-CM | POA: Diagnosis not present

## 2014-01-22 DIAGNOSIS — F29 Unspecified psychosis not due to a substance or known physiological condition: Secondary | ICD-10-CM | POA: Diagnosis not present

## 2014-01-25 ENCOUNTER — Telehealth: Payer: Self-pay | Admitting: *Deleted

## 2014-01-25 NOTE — Telephone Encounter (Signed)
Pt nurse called she is needing an FL2 form filled out for her records. Please call Demetria if any questions.

## 2014-01-29 ENCOUNTER — Other Ambulatory Visit: Payer: Self-pay

## 2014-01-29 DIAGNOSIS — K117 Disturbances of salivary secretion: Secondary | ICD-10-CM

## 2014-01-29 DIAGNOSIS — F259 Schizoaffective disorder, unspecified: Secondary | ICD-10-CM | POA: Diagnosis not present

## 2014-01-29 MED ORDER — GLYCOPYRROLATE 1 MG PO TABS: 1.0000 mg | ORAL_TABLET | Freq: Two times a day (BID) | ORAL | Status: DC

## 2014-01-29 MED ORDER — CLONAZEPAM 1 MG PO TABS: 1.0000 mg | ORAL_TABLET | Freq: Every morning | ORAL | Status: DC

## 2014-02-03 ENCOUNTER — Ambulatory Visit: Payer: Medicare Other | Admitting: Physician Assistant

## 2014-02-04 ENCOUNTER — Ambulatory Visit: Payer: Medicare Other | Admitting: Physician Assistant

## 2014-02-04 ENCOUNTER — Ambulatory Visit (INDEPENDENT_AMBULATORY_CARE_PROVIDER_SITE_OTHER): Payer: Medicare Other | Admitting: Family Medicine

## 2014-02-04 ENCOUNTER — Encounter: Payer: Self-pay | Admitting: Family Medicine

## 2014-02-04 VITALS — BP 110/74 | HR 84 | Temp 98.5°F | Resp 20

## 2014-02-04 DIAGNOSIS — M25579 Pain in unspecified ankle and joints of unspecified foot: Secondary | ICD-10-CM | POA: Diagnosis not present

## 2014-02-04 NOTE — Progress Notes (Signed)
Subjective:    Patient ID: Nancy [name], female    DOB: 20-Apr-1991, 23 y.o.   MRN: [id number]  HPI Patient has a history of DVTs.  She presents today with 2 days of pain in her left ankle. The pain is located in the ankle joint. It is tender to palpation around the medial and lateral malleolus. There is no edema in the left leg. There is no swelling. There is no warmth. Legs are equal and symmetric bilaterally below the knee. 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 I am unable to bend her knees. I am able to perform range of motion exercises with her left ankle which exacerbates her pain. Patient states she flipped her wheelchair several days ago and the pain began shortly after that. Past Medical History  Diagnosis Date  . CP (cerebral palsy)     with mild contracture  . MR (mental retardation)   . Mood disorder   . Peripheral vascular disease 10/02/2006    bilateral DVT lower legs  . Wheelchair bound     r/t CP  . Seizures     childhood  . Menorrhagia with regular cycle 11/11/2012  . Mobility impaired 11/11/2012  . IUD (intrauterine device) in place 10/2012  . Anemia     h/o iron deficiency   Past Surgical History  Procedure Laterality Date  . Multiple tooth extractions  10/18/10  . Leg surgery      as a child  . Eye surgery      as a child  . Intrauterine device (iud) insertion N/A 11/12/2012    Procedure: INTRAUTERINE DEVICE (IUD) INSERTION;  Surgeon: Sherron MondayJody Bovard, MD;  Location: [facility];  Service: Gynecology;  Laterality: N/A;  . Hip surgery Right 2003   No Known Allergies Current Outpatient Prescriptions on File Prior to Visit  Medication Sig Dispense Refill  . ARIPiprazole (ABILIFY) 5 MG tablet Take 1 tablet (5 mg total) by mouth once.  30 tablet  2  . benztropine (COGENTIN) 0.5 MG tablet Take 1 tablet (0.5 mg total) by mouth 2 (two) times daily.  30 tablet  1  . clonazePAM (KLONOPIN) 1 MG tablet Take 1 tablet (1 mg total)  by mouth every morning.  30 tablet  0  . glycopyrrolate (ROBINUL) 1 MG tablet Take 1 tablet (1 mg total) by mouth 2 (two) times daily.  60 tablet  1  . ketoconazole (NIZORAL) 2 % shampoo Apply 1 application topically 2 (two) times daily as needed for irritation.  120 mL    . risperiDONE (RISPERDAL) 1 MG tablet Take 1 tablet (1 mg total) by mouth at bedtime.  30 tablet  1  . traZODone (DESYREL) 50 MG tablet Take 50 mg by mouth at bedtime.       No current facility-administered medications on file prior to visit.      Review of Systems  All other systems reviewed and are negative.      Objective:   Physical Exam  Cardiovascular: Normal rate, regular rhythm and normal heart sounds.   Pulmonary/Chest: Effort normal and breath sounds normal. No respiratory distress. She has no wheezes. She has no rales.  Musculoskeletal: She exhibits no edema.       Left ankle: She exhibits decreased range of motion. She exhibits no swelling and no ecchymosis. Tenderness. Lateral malleolus and medial malleolus tenderness found. Achilles tendon normal.          Assessment & Plan:  1. Pain in joint, ankle and foot There is no swelling in the left leg. The patient has a negative Homans sign. I believe the most likely source of the patient's pain is a sprained left ankle. Obtain x-ray of the left ankle to rule out an occult fracture given the tenderness to palpation over the [facility].  Patient's pain is better today than it was yesterday. I recommended tincture of time and ibuprofen as needed for pain.  Seek medical attention immediately if she develops unilateral leg swelling or pain worsens. - DG Ankle Complete Left; Future

## 2014-02-05 ENCOUNTER — Telehealth: Payer: Self-pay | Admitting: Family Medicine

## 2014-02-05 ENCOUNTER — Ambulatory Visit
Admission: RE | Admit: 2014-02-05 | Discharge: 2014-02-05 | Disposition: A | Discharge status: Home / Self Care | Payer: Medicare Other | Source: Ambulatory Visit | Attending: Family Medicine | Admitting: Family Medicine

## 2014-02-05 DIAGNOSIS — M25579 Pain in unspecified ankle and joints of unspecified foot: Secondary | ICD-10-CM | POA: Diagnosis not present

## 2014-02-05 DIAGNOSIS — S8990XA Unspecified injury of unspecified lower leg, initial encounter: Secondary | ICD-10-CM | POA: Diagnosis not present

## 2014-02-05 DIAGNOSIS — S99929A Unspecified injury of unspecified foot, initial encounter: Secondary | ICD-10-CM | POA: Diagnosis not present

## 2014-02-05 DIAGNOSIS — S99919A Unspecified injury of unspecified ankle, initial encounter: Secondary | ICD-10-CM | POA: Diagnosis not present

## 2014-02-05 MED ORDER — IBUPROFEN 200 MG PO CAPS: 1.0000 | ORAL_CAPSULE | Freq: Four times a day (QID) | ORAL | Status: DC | PRN

## 2014-02-05 NOTE — Telephone Encounter (Signed)
Pt was seen in office 02/04/14

## 2014-02-05 NOTE — Telephone Encounter (Signed)
Message copied by Donne AnonPLUMMER, Petina Muraski M on Fri Feb 05, 2014  4:56 PM ------      Message from: Lynnea FerrierPICKARD, WARREN      Created: Fri Feb 05, 2014  9:41 AM       X ray is normal.  Suspect this is a sprained ankle.  Anticipate gradual improvement over 1 week.  Recheck if no better in 1 week or immediately if worsening. ------

## 2014-02-05 NOTE — Telephone Encounter (Signed)
Spoke to Caregiver about negative xray.  Understands to use Ibuprofen pr for pain (needs rx to pharmacy due to being in home)  Rx sent.  If not better in 1-2 weeks will need to see again.

## 2014-03-01 ENCOUNTER — Other Ambulatory Visit: Payer: Self-pay | Admitting: Family

## 2014-03-01 ENCOUNTER — Other Ambulatory Visit: Payer: Self-pay

## 2014-03-01 MED ORDER — CLONAZEPAM 1 MG PO TABS: 1.0000 mg | ORAL_TABLET | Freq: Every morning | ORAL | Status: DC

## 2014-03-01 MED ORDER — CLONAZEPAM 1 MG PO TABS
1.0000 mg | ORAL_TABLET | Freq: Every morning | ORAL | Status: DC
Start: 2014-03-01 — End: 2014-03-01

## 2014-03-09 ENCOUNTER — Other Ambulatory Visit: Payer: Self-pay | Admitting: *Deleted

## 2014-03-09 DIAGNOSIS — F29 Unspecified psychosis not due to a substance or known physiological condition: Secondary | ICD-10-CM

## 2014-03-09 MED ORDER — ARIPIPRAZOLE 5 MG PO TABS: 5.0000 mg | ORAL_TABLET | Freq: Once | ORAL | Status: DC

## 2014-03-09 NOTE — Telephone Encounter (Signed)
Received call from patient caregiver.   Reports that she requires refill on Abilify.   Refill appropriate and filled per protocol.

## 2014-03-11 ENCOUNTER — Ambulatory Visit: Payer: Medicare Other | Admitting: Pediatrics

## 2014-03-12 ENCOUNTER — Encounter: Payer: Self-pay | Admitting: *Deleted

## 2014-03-15 ENCOUNTER — Ambulatory Visit: Payer: Medicare Other | Admitting: Family Medicine

## 2014-03-15 ENCOUNTER — Encounter: Payer: Self-pay | Admitting: Pediatrics

## 2014-03-23 ENCOUNTER — Ambulatory Visit (INDEPENDENT_AMBULATORY_CARE_PROVIDER_SITE_OTHER): Payer: Medicare Other | Admitting: Family Medicine

## 2014-03-23 ENCOUNTER — Encounter: Payer: Self-pay | Admitting: Family Medicine

## 2014-03-23 VITALS — BP 98/62 | HR 98 | Temp 98.4°F | Resp 18

## 2014-03-23 DIAGNOSIS — F259 Schizoaffective disorder, unspecified: Secondary | ICD-10-CM

## 2014-03-23 DIAGNOSIS — Z79899 Other long term (current) drug therapy: Secondary | ICD-10-CM

## 2014-03-23 NOTE — Progress Notes (Signed)
Subjective:    Patient ID: Nancy [name], female    DOB: 01-Aug-1991, 23 y.o.   MRN: [id number]  Constipation  12/07/13 Patient presents today for hospital discharge follow up. She has been in the hospital on 2 separate occasions with psychosis, paranoia, delirium, agitation, and extreme anxiety. They have started the patient on risperdal 0.5 mg by mouth each bedtime.  During her last hospitalization this was increased to 1 mg by mouth each bedtime. This does not seem to be helping the patient all. When she takes a higher dose it leaves her overly sedated and almost catatonic.  However the lower doses did not help at the extreme paranoia and gets of her age. Over the last month she's had increasing sense of rage, she does not sleep at night, she goes days without sleeping, she has conversations with herself in the bed all night long. She hasn't had of rage where she passed out her hair and battles with caretakers.  Her caretakers state that she is having visual hallucinations at times. Baby has admitted hearing voices or dead grandfather. She is extremely tearful today in the exam room and will not answer my questions. She just continues to repeat "I want to go home," and crying.  At that time, my plan was: Patient has an outpatient appointment with psychiatry on May 14. I feel that this is too far off and I will try to speed this up. In the meantime I discontinued her risperdal and switched the patient to Abilify 5 mg by mouth each bedtime. Recheck the patient in 1 week and increase medication if necessary. I am concerned the patient is having psychosis due to mania/bipolar.    12/14/13 She is here today for followup..  she is doing much better. She is acting like herself again. She was diagnosed as these are affective disorder with bipolar tendencies. Her psychiatrist added trazodone to help her sleep at night 50 mg.  She is doing exceptionally well on this regimen.  Her only concern is  constipation. She's going to agree days without a bowel movement. She has a bowel movement they are hard and large.  At that time, my plan was: 1. Schizoaffective disorder Continue Abilify 5 mg by mouth daily and trazodone 50 mg by mouth each bedtime. Recheck in 3 months. At that time get fasting lipid panel CBC and CMP. 2. Unspecified constipation Begin with fleets enema per rectum daily for the next 2 days. Continue MiraLax 17 g by mouth daily thereafter to prevent constipation unless she develops diarrhea   03/23/14 Patient is here today for followup. She is still taking Abilify 5 mg by mouth daily. At some point she discontinue trazodone.  However she denies any insomnia. She denies any hallucinations. She denies any paranoid delusions. She denies any anxiety. She denies any manic symptoms. She denies any symptoms of severe depression. Overall she is doing extremely well with no complaints. She denies any motor tics. She denies any choreiform movements. Past Medical History  Diagnosis Date  . CP (cerebral palsy)     with mild contracture  . MR (mental retardation)   . Mood disorder   . Peripheral vascular disease 10/02/2006    bilateral DVT lower legs  . Wheelchair bound     r/t CP  . Seizures     childhood  . Menorrhagia with regular cycle 11/11/2012  . Mobility impaired 11/11/2012  . IUD (intrauterine device) in place 10/2012  . Anemia  h/o iron deficiency   .sph Current Outpatient Prescriptions on File Prior to Visit  Medication Sig Dispense Refill  . ARIPiprazole (ABILIFY) 5 MG tablet Take 1 tablet (5 mg total) by mouth once.  30 tablet  2  . benztropine (COGENTIN) 0.5 MG tablet Take 1 tablet (0.5 mg total) by mouth 2 (two) times daily.  30 tablet  1  . clonazePAM (KLONOPIN) 1 MG tablet Take 1 tablet (1 mg total) by mouth every morning.  30 tablet  0  . glycopyrrolate (ROBINUL) 1 MG tablet Take 1 tablet (1 mg total) by mouth 2 (two) times daily.  60 tablet  1  . Ibuprofen 200  MG CAPS Take 1 capsule (200 mg total) by mouth every 6 (six) hours as needed (pain or discomfort).  120 each  0  . ketoconazole (NIZORAL) 2 % shampoo Apply 1 application topically 2 (two) times daily as needed for irritation.  120 mL     No current facility-administered medications on file prior to visit.   No Known Allergies History   Social History  . Marital Status: Single    Spouse Name: N/A    Number of Children: N/A  . Years of Education: N/A   Occupational History  . Not on file.   Social History Main Topics  . Smoking status: Never Smoker   . Smokeless tobacco: Never Used  . Alcohol Use: No     Comment: Pt denies  . Drug Use: No     Comment: Pt denies  . Sexual Activity: No     Comment: lives in a group home.  single.   Other Topics Concern  . Not on file   Social History Narrative  . No narrative on file     Review of Systems  Gastrointestinal: Positive for constipation.  All other systems reviewed and are negative.      Objective:   Physical Exam  Vitals reviewed. Cardiovascular: Normal rate, regular rhythm and normal heart sounds.   Pulmonary/Chest: Effort normal and breath sounds normal. No respiratory distress. She has no wheezes.  Psychiatric: She has a normal mood and affect. Her speech is normal and behavior is normal. Thought content normal.          Assessment & Plan:   1. Encounter for long-term (current) use of other medications - COMPLETE METABOLIC PANEL WITH GFR - CBC with Differential  2. Schizoaffective disorder, unspecified type  patient's symptoms seem well controlled with Abilify and Klonopin. I made no changes in her medication at this time. I will check a CBC to evaluate for bone marrow suppression. Also check a CMP to evaluate her blood sugar as well as her liver function tests. I would like to check her fasting lipid panel whenever she is fasting to rule out hyperlipidemia secondary to antipsychotic medication. She can return  anytime for fasting lipid panel.

## 2014-03-24 LAB — CBC WITH DIFFERENTIAL/PLATELET
BASOS PCT: 0 % (ref 0–1)
Basophils Absolute: 0 10*3/uL (ref 0.0–0.1)
Eosinophils Absolute: 0.1 10*3/uL (ref 0.0–0.7)
Eosinophils Relative: 2 % (ref 0–5)
HEMATOCRIT: 42.8 % (ref 36.0–46.0)
HEMOGLOBIN: 14.5 g/dL (ref 12.0–15.0)
LYMPHS PCT: 46 % (ref 12–46)
Lymphs Abs: 3.1 10*3/uL (ref 0.7–4.0)
MCH: 31.7 pg (ref 26.0–34.0)
MCHC: 33.9 g/dL (ref 30.0–36.0)
MCV: 93.4 fL (ref 78.0–100.0)
MONO ABS: 0.5 10*3/uL (ref 0.1–1.0)
MONOS PCT: 7 % (ref 3–12)
NEUTROS ABS: 3.1 10*3/uL (ref 1.7–7.7)
NEUTROS PCT: 45 % (ref 43–77)
Platelets: 291 10*3/uL (ref 150–400)
RBC: 4.58 MIL/uL (ref 3.87–5.11)
RDW: 13.7 % (ref 11.5–15.5)
WBC: 6.8 10*3/uL (ref 4.0–10.5)

## 2014-03-24 LAB — COMPLETE METABOLIC PANEL WITH GFR
ALK PHOS: 49 U/L (ref 39–117)
ALT: 11 U/L (ref 0–35)
AST: 16 U/L (ref 0–37)
Albumin: 4.2 g/dL (ref 3.5–5.2)
BUN: 7 mg/dL (ref 6–23)
CO2: 23 meq/L (ref 19–32)
CREATININE: 0.42 mg/dL — AB (ref 0.50–1.10)
Calcium: 9.6 mg/dL (ref 8.4–10.5)
Chloride: 105 mEq/L (ref 96–112)
GFR, Est Non African American: >89 mL/min
GLUCOSE: 81 mg/dL (ref 70–99)
Potassium: 4.2 mEq/L (ref 3.5–5.3)
SODIUM: 138 meq/L (ref 135–145)
Total Bilirubin: 0.5 mg/dL (ref 0.2–1.2)
Total Protein: 7.3 g/dL (ref 6.0–8.3)

## 2014-04-12 ENCOUNTER — Telehealth: Payer: Self-pay | Admitting: Family Medicine

## 2014-04-12 DIAGNOSIS — K117 Disturbances of salivary secretion: Secondary | ICD-10-CM

## 2014-04-12 NOTE — Telephone Encounter (Signed)
cierra her caregiver is saying that [facility] is giving her some issues about Delainy's medications she would like a call back    [phone]

## 2014-04-13 NOTE — Telephone Encounter (Signed)
Dad theo is calling to see if we can try to get Nancy [name] a new wheel chair he says hers is falling apart  [phone]

## 2014-04-16 MED ORDER — CLONAZEPAM 1 MG PO TABS: 1.0000 mg | ORAL_TABLET | Freq: Every morning | ORAL | Status: DC

## 2014-04-16 MED ORDER — GLYCOPYRROLATE 1 MG PO TABS: 1.0000 mg | ORAL_TABLET | Freq: Two times a day (BID) | ORAL | Status: DC

## 2014-04-16 NOTE — Telephone Encounter (Signed)
Requesting a refill on Klonopin and Robinul - ? OK to Refill

## 2014-04-16 NOTE — Telephone Encounter (Signed)
rxs printed and faxed to pharm.

## 2014-04-16 NOTE — Telephone Encounter (Signed)
ok 

## 2014-04-16 NOTE — Telephone Encounter (Signed)
Received confirmation of fax at 14:16 (2:16 pm)

## 2014-06-02 ENCOUNTER — Other Ambulatory Visit: Payer: Self-pay | Admitting: Family Medicine

## 2014-06-02 DIAGNOSIS — M4120 Other idiopathic scoliosis, site unspecified: Secondary | ICD-10-CM | POA: Diagnosis not present

## 2014-06-03 ENCOUNTER — Other Ambulatory Visit: Payer: Self-pay | Admitting: *Deleted

## 2014-06-03 MED ORDER — ARIPIPRAZOLE 5 MG PO TABS: ORAL_TABLET | ORAL | Status: DC

## 2014-06-08 ENCOUNTER — Other Ambulatory Visit: Payer: Self-pay | Admitting: Family Medicine

## 2014-06-08 ENCOUNTER — Other Ambulatory Visit: Payer: Self-pay | Admitting: Physician Assistant

## 2014-06-08 NOTE — Telephone Encounter (Signed)
?   OK to Refill  

## 2014-06-08 NOTE — Telephone Encounter (Signed)
ok 

## 2014-06-16 ENCOUNTER — Encounter: Payer: Self-pay | Admitting: Family Medicine

## 2014-06-16 DIAGNOSIS — G808 Other cerebral palsy: Secondary | ICD-10-CM | POA: Diagnosis not present

## 2014-06-16 DIAGNOSIS — R262 Difficulty in walking, not elsewhere classified: Secondary | ICD-10-CM | POA: Diagnosis not present

## 2014-06-16 DIAGNOSIS — Z5189 Encounter for other specified aftercare: Secondary | ICD-10-CM | POA: Diagnosis not present

## 2014-06-30 ENCOUNTER — Other Ambulatory Visit: Payer: Self-pay | Admitting: *Deleted

## 2014-06-30 ENCOUNTER — Telehealth: Payer: Self-pay | Admitting: Family Medicine

## 2014-06-30 NOTE — Telephone Encounter (Signed)
Requesting a refill on Klonopin 1mg  qam - ? OK to Refill  Last refill was 05/31/14

## 2014-07-01 MED ORDER — CLONAZEPAM 1 MG PO TABS: ORAL_TABLET | ORAL | Status: DC

## 2014-07-01 NOTE — Telephone Encounter (Signed)
ok 

## 2014-07-01 NOTE — Telephone Encounter (Signed)
Rx printed and faxed to [facility] in [place]

## 2014-07-19 ENCOUNTER — Encounter: Payer: Medicare Other | Admitting: Physician Assistant

## 2014-08-03 ENCOUNTER — Ambulatory Visit (INDEPENDENT_AMBULATORY_CARE_PROVIDER_SITE_OTHER): Payer: Medicare Other | Admitting: Family Medicine

## 2014-08-03 ENCOUNTER — Encounter: Payer: Self-pay | Admitting: Family Medicine

## 2014-08-03 VITALS — BP 110/64 | HR 78 | Temp 98.4°F | Resp 16

## 2014-08-03 DIAGNOSIS — R112 Nausea with vomiting, unspecified: Secondary | ICD-10-CM

## 2014-08-03 DIAGNOSIS — Z23 Encounter for immunization: Secondary | ICD-10-CM

## 2014-08-03 NOTE — Addendum Note (Signed)
Addended by: Phillips OdorSIX, Saesha Llerenas H on: 08/03/2014 04:16 PM   Modules accepted: Orders

## 2014-08-03 NOTE — Patient Instructions (Signed)
Continue current meds Avoid coffee beverages Flu shot given F/U 3 months- Morning appointment Fasting - Dr. Tanya NonesPickard

## 2014-08-03 NOTE — Progress Notes (Signed)
Patient ID: Nancy [name], female   DOB: 04/14/1991, 23 y.o.   MRN: [id number]   Subjective:    Patient ID: Nancy PoeBrittany N [name], female    DOB: 04/14/1991, 23 y.o.   MRN: [id number]  Patient presents for N/V  patient here for follow-up. On Friday night she drank a mocha coffee which she never happened before on Saturday she had emesis 1 and felt better afterwards. She's not had any further emesis no diarrhea no fever no upper respiratory symptoms, no abd pain They're also requesting an updated F fell to form. She also needs a flu shot.    Review Of Systems:  GEN- denies fatigue, fever, weight loss,weakness, recent illness HEENT- denies eye drainage, change in vision, nasal discharge, CVS- denies chest pain, palpitations RESP- denies SOB, cough, wheeze ABD- + N/V, change in stools, abd pain GU- denies dysuria, hematuria, dribbling, incontinence Neuro- denies headache, dizziness, syncope, seizure activity       Objective:    BP 110/64 mmHg  Pulse 78  Temp(Src) 98.4 F (36.9 C) (Oral)  Resp 16 GEN- NAD, alert and oriented x3, in wheelchair HEENT- PERRL, EOMI, non injected sclera, pink conjunctiva, MMM, oropharynx clear CVS- RRR, no murmur RESP-CTAB ABD-NABS,soft,NT,ND Neuro- quadraplegic        Assessment & Plan:      Problem List Items Addressed This Visit    None    Visit Diagnoses    Non-intractable vomiting with nausea, vomiting of unspecified type    -  Primary    s/p coffee drink, no signs of infection, no further symptoms, FL2 signed by PCP , flu shot given       Note: This dictation was prepared with Dragon dictation along with smaller phrase technology. Any transcriptional errors that result from this process are unintentional.

## 2014-09-28 ENCOUNTER — Ambulatory Visit (INDEPENDENT_AMBULATORY_CARE_PROVIDER_SITE_OTHER): Payer: Medicare Other | Admitting: Family Medicine

## 2014-09-28 ENCOUNTER — Encounter: Payer: Self-pay | Admitting: Family Medicine

## 2014-09-28 VITALS — BP 98/64 | HR 74 | Temp 97.6°F | Resp 18 | Wt 105.0 lb

## 2014-09-28 DIAGNOSIS — F259 Schizoaffective disorder, unspecified: Secondary | ICD-10-CM | POA: Diagnosis not present

## 2014-09-28 DIAGNOSIS — G8389 Other specified paralytic syndromes: Secondary | ICD-10-CM | POA: Diagnosis not present

## 2014-09-28 DIAGNOSIS — Z993 Dependence on wheelchair: Secondary | ICD-10-CM

## 2014-09-28 DIAGNOSIS — G8250 Quadriplegia, unspecified: Secondary | ICD-10-CM

## 2014-09-28 NOTE — Progress Notes (Signed)
Subjective:    Patient ID: Nancy [name], female    DOB: 10/28/1990, 24 y.o.   MRN: [id number]  HPI  Patient is a 24 year old African-American female who is being seen today for a face-to-face evaluation for power wheelchair. 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 She needs a replacement for her current wheelchair as she has had her current wheelchair for over 5 years.   It is in need of replacement due to wear and tear from normal use. The battery cover has fallen off the back of the wheelchair. The toggle that operates a wheelchair is missing the knob. Several pieces are in disrepair on the wheelchair. Also her wheelchairs power seating is not working consistently. Furthermore the wheelchair no longer meets her positioning needs. She's been evaluated by physical therapist at [facility] at [facility]. They determined that the patient has poor static and dynamic sitting balance, decreased rate, decreased range of motion and requires maximum assistance for all ADLs.   The patient is not able to functionally ambulate independently or with assistance due to her spastic quadriplegia. She does not have sufficient strength in her lower extremities to even bear weight on her legs. She also does not have sufficient strength in her upper extremities to operate a manual wheelchair. In fact due to spasticity,  She is unable to fully extend her arms at the elbows. Furthermore her muscle strength is at best 3 over 5 in the upper extremities against resistance. Therefore I do not believe that with the contractures of her hands in her upper extremities she would be able to operate a manual wheelchair. Patient is unable to walk at all. Therefore she will  Relies on power mobility for all her functional mobility.   she also has schizoaffective disorder with bipolar tendencies with paranoia and psychosis. This is currently being well managed with Abilify.   There  is no evidence of depression or anxiety at the present time. She denies any hallucinations or delusions. There are no violent outburst. She is overdue for fasting lab work. Her flu shot is up-to-date. Past Medical History  Diagnosis Date  . CP (cerebral palsy)     with mild contracture  . MR (mental retardation)   . Mood disorder   . Peripheral vascular disease 10/02/2006    bilateral DVT lower legs  . Wheelchair bound     r/t CP  . Seizures     childhood  . Menorrhagia with regular cycle 11/11/2012  . Mobility impaired 11/11/2012  . IUD (intrauterine device) in place 10/2012  . Anemia     h/o iron deficiency   Past Surgical History  Procedure Laterality Date  . Multiple tooth extractions  10/18/10  . Leg surgery      as a child  . Eye surgery      as a child  . Intrauterine device (iud) insertion N/A 11/12/2012    Procedure: INTRAUTERINE DEVICE (IUD) INSERTION;  Surgeon: Sherron Monday, MD;  Location: [facility];  Service: Gynecology;  Laterality: N/A;  . Hip surgery Right 2003   Current Outpatient Prescriptions on File Prior to Visit  Medication Sig Dispense Refill  . ARIPiprazole (ABILIFY) 5 MG tablet TAKE (1) TABLET BY MOUTH ONCE DAILY. 30 tablet 4  . clonazePAM (KLONOPIN) 1 MG tablet TAKE (1) TABLET BY MOUTH EACH MORNING. 30 tablet 2  . glycopyrrolate (ROBINUL) 1 MG tablet TAKE (1) TABLET BY MOUTH TWICE DAILY. 60 tablet 2   No current facility-administered  medications on file prior to visit.   No Known Allergies History   Social History  . Marital Status: Single    Spouse Name: N/A    Number of Children: N/A  . Years of Education: N/A   Occupational History  . Not on file.   Social History Main Topics  . Smoking status: Never Smoker   . Smokeless tobacco: Never Used  . Alcohol Use: No     Comment: Pt denies  . Drug Use: No     Comment: Pt denies  . Sexual Activity: No     Comment: lives in a group home.  single.   Other Topics Concern  . Not on file   Social  History Narrative     Review of Systems  All other systems reviewed and are negative.      Objective:   Physical Exam  Constitutional: She is oriented to person, place, and time. She appears well-developed and well-nourished.  Cardiovascular: Normal rate, regular rhythm and normal heart sounds.  Exam reveals no gallop and no friction rub.   No murmur heard. Pulmonary/Chest: Effort normal and breath sounds normal. No respiratory distress. She has no wheezes. She has no rales.  Abdominal: Soft. Bowel sounds are normal.  Neurological: She is alert and oriented to person, place, and time. She exhibits abnormal muscle tone. Coordination abnormal.  Reflex Scores:      Tricep reflexes are 3+ on the right side and 3+ on the left side.      Bicep reflexes are 3+ on the right side and 3+ on the left side.      Brachioradialis reflexes are 3+ on the right side and 3+ on the left side. Vitals reviewed.  Patient has 3 out of 5 strength in her upper extremities. 2 out of 5 strength in her lower extremities. She has spastic contractures at her elbows and at her knees. She has hyperreflexia. She is confined to wheelchair and is unable to stand or walk even with maximum assistance.        Assessment & Plan:  Wheelchair bound  Schizoaffective disorder, unspecified type - Plan: COMPLETE METABOLIC PANEL WITH GFR, CBC with Differential/Platelet, Lipid panel  Spastic quadriplegia   patient requires a power wheelchair due to her spastic quadriparesis stemming from her cerebral palsy. Her current wheelchair is over 26 years old and is in disrepair from typical wear and tear. The wheelchair is no longer meeting her functional needs or positional needs. I will complete the necessary paperwork and fax this to her medical equipment company. I also have asked the patient to return fasting for a CMP as well as fasting lipid panel to monitor dyslipidemia stemming from her Abilify. Clinically the medicine is  working fine.

## 2014-09-29 ENCOUNTER — Telehealth: Payer: Self-pay | Admitting: Family Medicine

## 2014-09-29 MED ORDER — GLYCOPYRROLATE 1 MG PO TABS: ORAL_TABLET | ORAL | Status: DC

## 2014-09-29 NOTE — Telephone Encounter (Signed)
Requesting refill on Clonazepam - Last refill - 08/30/14 - ? OK to Refill

## 2014-09-30 MED ORDER — CLONAZEPAM 1 MG PO TABS: ORAL_TABLET | ORAL | Status: DC

## 2014-09-30 NOTE — Telephone Encounter (Signed)
Klonopin called to pharm

## 2014-09-30 NOTE — Telephone Encounter (Signed)
ok 

## 2014-10-20 ENCOUNTER — Encounter: Payer: Self-pay | Admitting: *Deleted

## 2014-10-27 ENCOUNTER — Telehealth: Payer: Self-pay | Admitting: *Deleted

## 2014-10-27 ENCOUNTER — Other Ambulatory Visit: Payer: Self-pay | Admitting: *Deleted

## 2014-10-27 MED ORDER — ARIPIPRAZOLE 5 MG PO TABS: ORAL_TABLET | ORAL | Status: DC

## 2014-10-27 NOTE — Telephone Encounter (Signed)
Lawson FiscalLori from [facility] called stating she had faxed over a letter of medical necessity for provider to review and sign on 10/13/14 and has not heard anything. I looked on Pickards desk and your desk and saw nothing. Do you know anything about this?

## 2014-10-27 NOTE — Telephone Encounter (Signed)
Refill appropriate and filled per protocol. 

## 2014-10-28 ENCOUNTER — Other Ambulatory Visit: Payer: Self-pay | Admitting: Family Medicine

## 2014-10-28 MED ORDER — ARIPIPRAZOLE 5 MG PO TABS: ORAL_TABLET | ORAL | Status: DC

## 2014-10-28 NOTE — Telephone Encounter (Signed)
Med sent to pharm 

## 2014-10-28 NOTE — Telephone Encounter (Signed)
No I have not seen anything for her recently.

## 2014-11-04 ENCOUNTER — Ambulatory Visit: Payer: Medicare Other | Admitting: Family Medicine

## 2014-11-04 ENCOUNTER — Other Ambulatory Visit: Payer: Self-pay | Admitting: Family Medicine

## 2014-11-04 DIAGNOSIS — Z79899 Other long term (current) drug therapy: Secondary | ICD-10-CM

## 2014-11-16 ENCOUNTER — Telehealth: Payer: Self-pay | Admitting: Family Medicine

## 2014-11-16 ENCOUNTER — Ambulatory Visit (INDEPENDENT_AMBULATORY_CARE_PROVIDER_SITE_OTHER): Payer: Medicare Other | Admitting: Family Medicine

## 2014-11-16 ENCOUNTER — Encounter: Payer: Self-pay | Admitting: Family Medicine

## 2014-11-16 VITALS — BP 100/80 | HR 84 | Temp 97.8°F | Resp 18 | Wt 104.0 lb

## 2014-11-16 DIAGNOSIS — Z79899 Other long term (current) drug therapy: Secondary | ICD-10-CM | POA: Diagnosis not present

## 2014-11-16 LAB — CBC WITH DIFFERENTIAL/PLATELET
BASOS ABS: 0 10*3/uL (ref 0.0–0.1)
BASOS PCT: 0 % (ref 0–1)
EOS ABS: 0.1 10*3/uL (ref 0.0–0.7)
EOS PCT: 1 % (ref 0–5)
HCT: 44.2 % (ref 36.0–46.0)
HEMOGLOBIN: 14.7 g/dL (ref 12.0–15.0)
LYMPHS ABS: 2.2 10*3/uL (ref 0.7–4.0)
LYMPHS PCT: 38 % (ref 12–46)
MCH: 30.8 pg (ref 26.0–34.0)
MCHC: 33.3 g/dL (ref 30.0–36.0)
MCV: 92.5 fL (ref 78.0–100.0)
MPV: 9.2 fL (ref 8.6–12.4)
Monocytes Absolute: 0.5 10*3/uL (ref 0.1–1.0)
Monocytes Relative: 9 % (ref 3–12)
Neutro Abs: 3 10*3/uL (ref 1.7–7.7)
Neutrophils Relative %: 52 % (ref 43–77)
Platelets: 286 10*3/uL (ref 150–400)
RBC: 4.78 MIL/uL (ref 3.87–5.11)
RDW: 13.3 % (ref 11.5–15.5)
WBC: 5.7 10*3/uL (ref 4.0–10.5)

## 2014-11-16 LAB — HEMOGLOBIN A1C
Hgb A1c MFr Bld: 5.5 % (ref <5.7)
Mean Plasma Glucose: 111 mg/dL (ref <117)

## 2014-11-16 LAB — COMPLETE METABOLIC PANEL WITH GFR
ALK PHOS: 42 U/L (ref 39–117)
ALT: 13 U/L (ref 0–35)
AST: 15 U/L (ref 0–37)
Albumin: 4.3 g/dL (ref 3.5–5.2)
BILIRUBIN TOTAL: 0.7 mg/dL (ref 0.2–1.2)
BUN: 8 mg/dL (ref 6–23)
CALCIUM: 9.5 mg/dL (ref 8.4–10.5)
CO2: 23 meq/L (ref 19–32)
CREATININE: 0.48 mg/dL — AB (ref 0.50–1.10)
Chloride: 102 mEq/L (ref 96–112)
GFR, Est African American: >89 mL/min
GFR, Est Non African American: >89 mL/min
GLUCOSE: 86 mg/dL (ref 70–99)
Potassium: 4.3 mEq/L (ref 3.5–5.3)
Sodium: 135 mEq/L (ref 135–145)
Total Protein: 7 g/dL (ref 6.0–8.3)

## 2014-11-16 LAB — LIPID PANEL
Cholesterol: 117 mg/dL (ref 0–200)
HDL: 34 mg/dL — ABNORMAL LOW (ref >=46)
LDL Cholesterol: 75 mg/dL (ref 0–99)
TRIGLYCERIDES: 40 mg/dL (ref <150)
Total CHOL/HDL Ratio: 3.4 Ratio
VLDL: 8 mg/dL (ref 0–40)

## 2014-11-16 NOTE — Progress Notes (Signed)
Subjective:    Patient ID: Nancy [name], female    DOB: [birth year], 24 y.o.   MRN: [id number]  HPI  09/28/14 Patient is a 24 year old African-American female who is being seen today for a face-to-face evaluation for power wheelchair. 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 She needs a replacement for her current wheelchair as she has had her current wheelchair for over 5 years.   It is in need of replacement due to wear and tear from normal use. The battery cover has fallen off the back of the wheelchair. The toggle that operates a wheelchair is missing the knob. Several pieces are in disrepair on the wheelchair. Also her wheelchairs power seating is not working consistently. Furthermore the wheelchair no longer meets her positioning needs. She's been evaluated by physical therapist at [facility]. They determined that the patient has poor static and dynamic sitting balance, decreased rate, decreased range of motion and requires maximum assistance for all ADLs.   The patient is not able to functionally ambulate independently or with assistance due to her spastic quadriplegia. She does not have sufficient strength in her lower extremities to even bear weight on her legs. She also does not have sufficient strength in her upper extremities to operate a manual wheelchair. In fact due to spasticity,  She is unable to fully extend her arms at the elbows. Furthermore her muscle strength is at best 3 over 5 in the upper extremities against resistance. Therefore I do not believe that with the contractures of her hands in her upper extremities she would be able to operate a manual wheelchair. Patient is unable to walk at all. Therefore she will  Relies on power mobility for all her functional mobility.   she also has schizoaffective disorder with bipolar tendencies with paranoia and psychosis. This is currently being well managed with Abilify.    There is no evidence of depression or anxiety at the present time. She denies any hallucinations or delusions. There are no violent outburst. She is overdue for fasting lab work. Her flu shot is up-to-date.  At that time, my plan was:   patient requires a power wheelchair due to her spastic quadriparesis stemming from her cerebral palsy. Her current wheelchair is over 65 years old and is in disrepair from typical wear and tear. The wheelchair is no longer meeting her functional needs or positional needs. I will complete the necessary paperwork and fax this to her medical equipment company. I also have asked the patient to return fasting for a CMP as well as fasting lipid panel to monitor dyslipidemia stemming from her Abilify. Clinically the medicine is working fine.  11/16/14 She is here today for fasting lab work to monitor for complications stemming from the abilify.  Otherwise she is doing well. Past Medical History  Diagnosis Date  . CP (cerebral palsy)     with mild contracture  . MR (mental retardation)   . Mood disorder   . Peripheral vascular disease 10/02/2006    bilateral DVT lower legs  . Wheelchair bound     r/t CP  . Seizures     childhood  . Menorrhagia with regular cycle 11/11/2012  . Mobility impaired 11/11/2012  . IUD (intrauterine device) in place 10/2012  . Anemia     h/o iron deficiency   Past Surgical History  Procedure Laterality Date  . Multiple tooth extractions  10/18/10  . Leg surgery  as a child  . Eye surgery      as a child  . Intrauterine device (iud) insertion N/A 11/12/2012    Procedure: INTRAUTERINE DEVICE (IUD) INSERTION;  Surgeon: Sherron MondayJody Bovard, MD;  Location: [facility];  Service: Gynecology;  Laterality: N/A;  . Hip surgery Right 2003   Current Outpatient Prescriptions on File Prior to Visit  Medication Sig Dispense Refill  . ARIPiprazole (ABILIFY) 5 MG tablet TAKE (1) TABLET BY MOUTH ONCE DAILY. 30 tablet 11  . clonazePAM (KLONOPIN) 1 MG tablet TAKE  (1) TABLET BY MOUTH EACH MORNING. 30 tablet 2  . glycopyrrolate (ROBINUL) 1 MG tablet TAKE (1) TABLET BY MOUTH TWICE DAILY. 60 tablet 5   No current facility-administered medications on file prior to visit.   No Known Allergies History   Social History  . Marital Status: Single    Spouse Name: N/A  . Number of Children: N/A  . Years of Education: N/A   Occupational History  . Not on file.   Social History Main Topics  . Smoking status: Never Smoker   . Smokeless tobacco: Never Used  . Alcohol Use: No     Comment: Pt denies  . Drug Use: No     Comment: Pt denies  . Sexual Activity: No     Comment: lives in a group home.  single.   Other Topics Concern  . Not on file   Social History Narrative     Review of Systems  All other systems reviewed and are negative.      Objective:   Physical Exam  Constitutional: She is oriented to person, place, and time. She appears well-developed and well-nourished.  Cardiovascular: Normal rate, regular rhythm and normal heart sounds.  Exam reveals no gallop and no friction rub.   No murmur heard. Pulmonary/Chest: Effort normal and breath sounds normal. No respiratory distress. She has no wheezes. She has no rales.  Abdominal: Soft. Bowel sounds are normal.  Neurological: She is alert and oriented to person, place, and time. She exhibits abnormal muscle tone. Coordination abnormal.  Reflex Scores:      Tricep reflexes are 3+ on the right side and 3+ on the left side.      Bicep reflexes are 3+ on the right side and 3+ on the left side.      Brachioradialis reflexes are 3+ on the right side and 3+ on the left side. Vitals reviewed.  Patient has 3 out of 5 strength in her upper extremities. 2 out of 5 strength in her lower extremities. She has spastic contractures at her elbows and at her knees. She has hyperreflexia. She is confined to wheelchair and is unable to stand or walk even with maximum assistance.        Assessment &  Plan:  High risk medication use - Plan: COMPLETE METABOLIC PANEL WITH GFR, Lipid panel, Hemoglobin A1c, CBC with Differential/Platelet obtain CBC, CMP, fasting lipid panel, and hemoglobin A1c to evaluate for dyslipidemia, hyperlipidemia, and hyperglycemia on Abilify

## 2014-11-16 NOTE — Telephone Encounter (Signed)
MD please advise

## 2014-11-16 NOTE — Telephone Encounter (Signed)
Six months

## 2014-11-16 NOTE — Telephone Encounter (Signed)
Patients dad is calling to see when they should be scheduled for another appt, dad said dr pickard did not clarify this when they left  [phone]

## 2014-11-16 NOTE — Telephone Encounter (Signed)
Call placed to patient. LMTRC.  

## 2014-11-17 NOTE — Telephone Encounter (Signed)
Call placed to patient and patient father made aware.   Appointment scheduled.

## 2014-11-17 NOTE — Telephone Encounter (Signed)
Call placed to patient. LMTRC.  

## 2014-11-18 ENCOUNTER — Encounter: Payer: Self-pay | Admitting: *Deleted

## 2014-12-29 ENCOUNTER — Telehealth: Payer: Self-pay | Admitting: Family Medicine

## 2014-12-29 NOTE — Telephone Encounter (Signed)
Requesting a refill on Klonopin - ? OK to Refill  

## 2014-12-30 MED ORDER — CLONAZEPAM 1 MG PO TABS: ORAL_TABLET | ORAL | Status: DC

## 2014-12-30 NOTE — Telephone Encounter (Signed)
Med called to pharm 

## 2014-12-30 NOTE — Telephone Encounter (Signed)
ok 

## 2015-03-28 ENCOUNTER — Other Ambulatory Visit: Payer: Self-pay | Admitting: Family Medicine

## 2015-03-28 NOTE — Telephone Encounter (Signed)
Ok to refill Clonazepam??  Last office visit 11/16/2014.  Last refill 12/30/2014, #2 refills.

## 2015-03-29 NOTE — Telephone Encounter (Signed)
ok 

## 2015-03-29 NOTE — Telephone Encounter (Signed)
Medication called to pharmacy. 

## 2015-04-19 DIAGNOSIS — H5043 Accommodative component in esotropia: Secondary | ICD-10-CM | POA: Diagnosis not present

## 2015-04-19 DIAGNOSIS — H5203 Hypermetropia, bilateral: Secondary | ICD-10-CM | POA: Diagnosis not present

## 2015-05-03 ENCOUNTER — Other Ambulatory Visit: Payer: Self-pay | Admitting: Family Medicine

## 2015-05-03 NOTE — Telephone Encounter (Signed)
Clonazepam was just called in last month for #30 + 2.  Pharmacy called and refills clarified.

## 2015-05-23 ENCOUNTER — Ambulatory Visit: Payer: Self-pay | Admitting: Family Medicine

## 2015-05-27 ENCOUNTER — Encounter: Payer: Self-pay | Admitting: Family Medicine

## 2015-05-27 ENCOUNTER — Ambulatory Visit (INDEPENDENT_AMBULATORY_CARE_PROVIDER_SITE_OTHER): Payer: Medicare Other | Admitting: Family Medicine

## 2015-05-27 VITALS — BP 106/68 | HR 88 | Temp 98.8°F | Resp 20

## 2015-05-27 DIAGNOSIS — Z993 Dependence on wheelchair: Secondary | ICD-10-CM

## 2015-05-27 DIAGNOSIS — K117 Disturbances of salivary secretion: Secondary | ICD-10-CM | POA: Diagnosis not present

## 2015-05-27 DIAGNOSIS — Z79899 Other long term (current) drug therapy: Secondary | ICD-10-CM

## 2015-05-27 DIAGNOSIS — Z23 Encounter for immunization: Secondary | ICD-10-CM

## 2015-05-27 DIAGNOSIS — G8389 Other specified paralytic syndromes: Secondary | ICD-10-CM

## 2015-05-27 DIAGNOSIS — F259 Schizoaffective disorder, unspecified: Secondary | ICD-10-CM | POA: Diagnosis not present

## 2015-05-27 DIAGNOSIS — G8250 Quadriplegia, unspecified: Secondary | ICD-10-CM

## 2015-05-27 MED ORDER — GLYCOPYRROLATE 1 MG PO TABS: 1.0000 mg | ORAL_TABLET | Freq: Three times a day (TID) | ORAL | Status: DC

## 2015-05-27 MED ORDER — ARIPIPRAZOLE 2 MG PO TABS: 2.0000 mg | ORAL_TABLET | Freq: Every day | ORAL | Status: DC

## 2015-05-27 NOTE — Addendum Note (Signed)
Addended by: Legrand Rams B on: 05/27/2015 04:58 PM   Modules accepted: Orders

## 2015-05-27 NOTE — Progress Notes (Signed)
Subjective:    Patient ID: Nancy [name], female    DOB: [birth year], 24 y.o.   MRN: [id number]  HPI  09/28/14 Patient is a 24 year old African-American female who is being seen today for a face-to-face evaluation for power wheelchair. 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 She needs a replacement for her current wheelchair as she has had her current wheelchair for over 5 years.   It is in need of replacement due to wear and tear from normal use. The battery cover has fallen off the back of the wheelchair. The toggle that operates a wheelchair is missing the knob. Several pieces are in disrepair on the wheelchair. Also her wheelchairs power seating is not working consistently. Furthermore the wheelchair no longer meets her positioning needs. She's been evaluated by physical therapist at [facility]. They determined that the patient has poor static and dynamic sitting balance, decreased rate, decreased range of motion and requires maximum assistance for all ADLs.   The patient is not able to functionally ambulate independently or with assistance due to her spastic quadriplegia. She does not have sufficient strength in her lower extremities to even bear weight on her legs. She also does not have sufficient strength in her upper extremities to operate a manual wheelchair. In fact due to spasticity,  She is unable to fully extend her arms at the elbows. Furthermore her muscle strength is at best 3 over 5 in the upper extremities against resistance. Therefore I do not believe that with the contractures of her hands in her upper extremities she would be able to operate a manual wheelchair. Patient is unable to walk at all. Therefore she will  Relies on power mobility for all her functional mobility.   she also has schizoaffective disorder with bipolar tendencies with paranoia and psychosis. This is currently being well managed with Abilify.    There is no evidence of depression or anxiety at the present time. She denies any hallucinations or delusions. There are no violent outburst. She is overdue for fasting lab work. Her flu shot is up-to-date.  At that time, my plan was:   patient requires a power wheelchair due to her spastic quadriparesis stemming from her cerebral palsy. Her current wheelchair is over 54 years old and is in disrepair from typical wear and tear. The wheelchair is no longer meeting her functional needs or positional needs. I will complete the necessary paperwork and fax this to her medical equipment company. I also have asked the patient to return fasting for a CMP as well as fasting lipid panel to monitor dyslipidemia stemming from her Abilify. Clinically the medicine is working fine.  11/16/14 She is here today for fasting lab work to monitor for complications stemming from the abilify.  Otherwise she is doing well.  At that time, my plan was: obtain CBC, CMP, fasting lipid panel, and hemoglobin A1c to evaluate for dyslipidemia, hyperlipidemia, and hyperglycemia on Abilify  05/27/15 Patient is here today for follow-up. She seems to be drooling more excessively than in the past. Throughout her entire encounter today, her father is constantly having to wipe the drool from her mouth. She is no longer living in a group home. She lives at home with her father. During the day she goes to school and is in a work program. She is self-conscious and she is very concerned about the drooling and being embarrassed by this. She also reports difficulty going to the bathroom. She  is not constipated. Instead, she reports having stool that is mushy and constantly leaks out.  Partly, this is due to diminished rectal tone. However she does not have formed bowel movements. She cannot wipe enough to get herself cleaned. She is not taking any kind of fiber supplement.  She also complains of vocal tics and stuttering ever since starting the Abilify.  Patient has been stable now for more than a year. She had schizoaffective disorder bipolar type with mood swings, hallucinations, and violent outburst. She is interested in stopping the medication. Past Medical History  Diagnosis Date  . CP (cerebral palsy)     with mild contracture  . MR (mental retardation)   . Mood disorder   . Peripheral vascular disease 10/02/2006    bilateral DVT lower legs  . Wheelchair bound     r/t CP  . Seizures     childhood  . Menorrhagia with regular cycle 11/11/2012  . Mobility impaired 11/11/2012  . IUD (intrauterine device) in place 10/2012  . Anemia     h/o iron deficiency   Past Surgical History  Procedure Laterality Date  . Multiple tooth extractions  10/18/10  . Leg surgery      as a child  . Eye surgery      as a child  . Intrauterine device (iud) insertion N/A 11/12/2012    Procedure: INTRAUTERINE DEVICE (IUD) INSERTION;  Surgeon: Sherron Monday, MD;  Location: [facility];  Service: Gynecology;  Laterality: N/A;  . Hip surgery Right 2003   Current Outpatient Prescriptions on File Prior to Visit  Medication Sig Dispense Refill  . ARIPiprazole (ABILIFY) 5 MG tablet TAKE (1) TABLET BY MOUTH ONCE DAILY. 30 tablet 11  . clonazePAM (KLONOPIN) 1 MG tablet TAKE ONE TABLET EVERY MORNING 30 tablet 2  . glycopyrrolate (ROBINUL) 1 MG tablet TAKE ONE TABLET TWICE DAILY 60 tablet 2   No current facility-administered medications on file prior to visit.   No Known Allergies Social History   Social History  . Marital Status: Single    Spouse Name: N/A  . Number of Children: N/A  . Years of Education: N/A   Occupational History  . Not on file.   Social History Main Topics  . Smoking status: Never Smoker   . Smokeless tobacco: Never Used  . Alcohol Use: No     Comment: Pt denies  . Drug Use: No     Comment: Pt denies  . Sexual Activity: No     Comment: lives in a group home.  single.   Other Topics Concern  . Not on file   Social History Narrative       Review of Systems  All other systems reviewed and are negative.      Objective:   Physical Exam  Constitutional: She is oriented to person, place, and time. She appears well-developed and well-nourished.  Cardiovascular: Normal rate, regular rhythm and normal heart sounds.  Exam reveals no gallop and no friction rub.   No murmur heard. Pulmonary/Chest: Effort normal and breath sounds normal. No respiratory distress. She has no wheezes. She has no rales.  Abdominal: Soft. Bowel sounds are normal.  Neurological: She is alert and oriented to person, place, and time. She exhibits abnormal muscle tone. Coordination abnormal.  Reflex Scores:      Tricep reflexes are 3+ on the right side and 3+ on the left side.      Bicep reflexes are 3+ on the right side and 3+ on  the left side.      Brachioradialis reflexes are 3+ on the right side and 3+ on the left side. Vitals reviewed.  Patient has 3 out of 5 strength in her upper extremities. 2 out of 5 strength in her lower extremities. She has spastic contractures at her elbows and at her knees. She has hyperreflexia. She is confined to wheelchair and is unable to stand or walk even with maximum assistance.        Assessment & Plan:  Drooling - Plan: glycopyrrolate (ROBINUL) 1 MG tablet  High risk medication use  Wheelchair bound  Schizoaffective disorder, unspecified type  Spastic quadriplegia  For the patient's drooling, I wouldn't increase glycopyrrolate to its highest dose, 1 mg by mouth 3 times a day. I would recommend Metamucil or some type of fiber  Supplement as a bulking agent to hopefully create more formed bowel movements. I will recommend decreasing Abilify from 5 mg a day to 2 mg a day. However I would not discontinue the medication because I am worried about relapse. Patient also received her flu shot today

## 2015-06-28 ENCOUNTER — Other Ambulatory Visit: Payer: Self-pay | Admitting: Family Medicine

## 2015-06-29 NOTE — Telephone Encounter (Signed)
Ok to refill??  Last office visit 05/27/2015.  Last refill 03/29/2015, #2 refills.

## 2015-06-30 NOTE — Telephone Encounter (Signed)
ok 

## 2015-06-30 NOTE — Telephone Encounter (Signed)
Medication refilled per protocol. 

## 2015-09-06 ENCOUNTER — Ambulatory Visit (INDEPENDENT_AMBULATORY_CARE_PROVIDER_SITE_OTHER): Payer: Medicare Other | Admitting: Family Medicine

## 2015-09-06 ENCOUNTER — Encounter: Payer: Self-pay | Admitting: Family Medicine

## 2015-09-06 VITALS — BP 110/74 | HR 86 | Temp 98.2°F | Resp 18

## 2015-09-06 DIAGNOSIS — F259 Schizoaffective disorder, unspecified: Secondary | ICD-10-CM | POA: Diagnosis not present

## 2015-09-06 DIAGNOSIS — Z79899 Other long term (current) drug therapy: Secondary | ICD-10-CM

## 2015-09-06 NOTE — Progress Notes (Signed)
Subjective:    Patient ID: Nancy [name], female    DOB: 1991-05-17, 25 y.o.   MRN: [id number]  HPI  09/28/14 Patient is a 25 year old African-American female who is being seen today for a face-to-face evaluation for power wheelchair. 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 She needs a replacement for her current wheelchair as she has had her current wheelchair for over 5 years.   It is in need of replacement due to wear and tear from normal use. The battery cover has fallen off the back of the wheelchair. The toggle that operates a wheelchair is missing the knob. Several pieces are in disrepair on the wheelchair. Also her wheelchairs power seating is not working consistently. Furthermore the wheelchair no longer meets her positioning needs. She's been evaluated by physical therapist at [facility]. They determined that the patient has poor static and dynamic sitting balance, decreased rate, decreased range of motion and requires maximum assistance for all ADLs.   The patient is not able to functionally ambulate independently or with assistance due to her spastic quadriplegia. She does not have sufficient strength in her lower extremities to even bear weight on her legs. She also does not have sufficient strength in her upper extremities to operate a manual wheelchair. In fact due to spasticity,  She is unable to fully extend her arms at the elbows. Furthermore her muscle strength is at best 3 over 5 in the upper extremities against resistance. Therefore I do not believe that with the contractures of her hands in her upper extremities she would be able to operate a manual wheelchair. Patient is unable to walk at all. Therefore she will  Relies on power mobility for all her functional mobility.   she also has schizoaffective disorder with bipolar tendencies with paranoia and psychosis. This is currently being well managed with Abilify.    There is no evidence of depression or anxiety at the present time. She denies any hallucinations or delusions. There are no violent outburst. She is overdue for fasting lab work. Her flu shot is up-to-date.  At that time, my plan was:   patient requires a power wheelchair due to her spastic quadriparesis stemming from her cerebral palsy. Her current wheelchair is over 14 years old and is in disrepair from typical wear and tear. The wheelchair is no longer meeting her functional needs or positional needs. I will complete the necessary paperwork and fax this to her medical equipment company. I also have asked the patient to return fasting for a CMP as well as fasting lipid panel to monitor dyslipidemia stemming from her Abilify. Clinically the medicine is working fine.  11/16/14 She is here today for fasting lab work to monitor for complications stemming from the abilify.  Otherwise she is doing well.  At that time, my plan was: obtain CBC, CMP, fasting lipid panel, and hemoglobin A1c to evaluate for dyslipidemia, hyperlipidemia, and hyperglycemia on Abilify  05/27/15 Patient is here today for follow-up. She seems to be drooling more excessively than in the past. Throughout her entire encounter today, her father is constantly having to wipe the drool from her mouth. She is no longer living in a group home. She lives at home with her father. During the day she goes to school and is in a work program. She is self-conscious and she is very concerned about the drooling and being embarrassed by this. She also reports difficulty going to the bathroom. She  is not constipated. Instead, she reports having stool that is mushy and constantly leaks out.  Partly, this is due to diminished rectal tone. However she does not have formed bowel movements. She cannot wipe enough to get herself cleaned. She is not taking any kind of fiber supplement.  She also complains of vocal tics and stuttering ever since starting the Abilify.  Patient has been stable now for more than a year. She had schizoaffective disorder bipolar type with mood swings, hallucinations, and violent outburst. She is interested in stopping the medication.  At that time, my plan was: For the patient's drooling, I wouldn't increase glycopyrrolate to its highest dose, 1 mg by mouth 3 times a day. I would recommend Metamucil or some type of fiber  Supplement as a bulking agent to hopefully create more formed bowel movements. I will recommend decreasing Abilify from 5 mg a day to 2 mg a day. However I would not discontinue the medication because I am worried about relapse. Patient also received her flu shot today  09/06/15  She is here today for recheck. She is doing very well on Abilify 2 mg by mouth daily.   She denies any side effects from the medication. She denies any hallucinations. She denies any delusions. She denies any aggressive behavior. She denies any mood swings while outburst. She is accompanied by her father agrees. She does have occasional  Sad days. These typically last 1-2 days and then resolve spontaneously.  Frequently this S doing the patient becomes frustrated by her medical problems. However she denies any depression or suicidal ideation. She is interested in possibly stopping the medication in the future. She also has had one episode of trace blood in her stool. This occurred yesterday. It was just a little bit of blood when they wiped. There are no hemorrhoids or pain with defecation. She denies any diarrhea or constipation. Past Medical History  Diagnosis Date  . CP (cerebral palsy) (HCC)     with mild contracture  . MR (mental retardation)   . Mood disorder (HCC)   . Peripheral vascular disease (HCC) 10/02/2006    bilateral DVT lower legs  . Wheelchair bound     r/t CP  . Seizures (HCC)     childhood  . Menorrhagia with regular cycle 11/11/2012  . Mobility impaired 11/11/2012  . IUD (intrauterine device) in place 10/2012  . Anemia      h/o iron deficiency   Past Surgical History  Procedure Laterality Date  . Multiple tooth extractions  10/18/10  . Leg surgery      as a child  . Eye surgery      as a child  . Intrauterine device (iud) insertion N/A 11/12/2012    Procedure: INTRAUTERINE DEVICE (IUD) INSERTION;  Surgeon: Sherron Monday, MD;  Location: [facility];  Service: Gynecology;  Laterality: N/A;  . Hip surgery Right 2003   Current Outpatient Prescriptions on File Prior to Visit  Medication Sig Dispense Refill  . ARIPiprazole (ABILIFY) 2 MG tablet Take 1 tablet (2 mg total) by mouth daily. 30 tablet 3  . clonazePAM (KLONOPIN) 1 MG tablet TAKE ONE TABLET EVERY MORNING 30 tablet 2  . glycopyrrolate (ROBINUL) 1 MG tablet TAKE ONE TABLET TWICE DAILY 60 tablet 2   No current facility-administered medications on file prior to visit.   No Known Allergies Social History   Social History  . Marital Status: Single    Spouse Name: N/A  . Number of Children: N/A  .  Years of Education: N/A   Occupational History  . Not on file.   Social History Main Topics  . Smoking status: Never Smoker   . Smokeless tobacco: Never Used  . Alcohol Use: No     Comment: Pt denies  . Drug Use: No     Comment: Pt denies  . Sexual Activity: No     Comment: lives in a group home.  single.   Other Topics Concern  . Not on file   Social History Narrative     Review of Systems  All other systems reviewed and are negative.      Objective:   Physical Exam  Constitutional: She is oriented to person, place, and time. She appears well-developed and well-nourished.  Cardiovascular: Normal rate, regular rhythm and normal heart sounds.  Exam reveals no gallop and no friction rub.   No murmur heard. Pulmonary/Chest: Effort normal and breath sounds normal. No respiratory distress. She has no wheezes. She has no rales.  Abdominal: Soft. Bowel sounds are normal.  Neurological: She is alert and oriented to person, place, and time. She  exhibits abnormal muscle tone. Coordination abnormal.  Reflex Scores:      Tricep reflexes are 3+ on the right side and 3+ on the left side.      Bicep reflexes are 3+ on the right side and 3+ on the left side.      Brachioradialis reflexes are 3+ on the right side and 3+ on the left side. Vitals reviewed.  Patient has 3 out of 5 strength in her upper extremities. 2 out of 5 strength in her lower extremities. She has spastic contractures at her elbows and at her knees. She has hyperreflexia. She is confined to wheelchair and is unable to stand or walk even with maximum assistance.        Assessment & Plan:  High risk medication use - Plan: CBC with Differential/Platelet, COMPLETE METABOLIC PANEL WITH GFR, TSH, Lipid panel  Schizoaffective disorder, unspecified type (HCC)  schizoaffective disorder is well controlled. I will continue Abilify for the present time. Given the nature of the medication, I will check her liver function tests, her fasting blood sugar, profile, and her cholesterol. I believe the blood that they saw yesterday was just irritation due to wiping. If this persists, we could workup further but at the present time I see no indication for anoscopy.

## 2015-09-07 LAB — CBC WITH DIFFERENTIAL/PLATELET
Basophils Absolute: 0 10*3/uL (ref 0.0–0.1)
Basophils Relative: 0 % (ref 0–1)
EOS PCT: 1 % (ref 0–5)
Eosinophils Absolute: 0.1 10*3/uL (ref 0.0–0.7)
HEMATOCRIT: 43.5 % (ref 36.0–46.0)
HEMOGLOBIN: 14.4 g/dL (ref 12.0–15.0)
LYMPHS ABS: 2.5 10*3/uL (ref 0.7–4.0)
LYMPHS PCT: 37 % (ref 12–46)
MCH: 31 pg (ref 26.0–34.0)
MCHC: 33.1 g/dL (ref 30.0–36.0)
MCV: 93.8 fL (ref 78.0–100.0)
MONO ABS: 0.4 10*3/uL (ref 0.1–1.0)
MONOS PCT: 6 % (ref 3–12)
MPV: 9.5 fL (ref 8.6–12.4)
NEUTROS PCT: 56 % (ref 43–77)
Neutro Abs: 3.8 10*3/uL (ref 1.7–7.7)
Platelets: 303 10*3/uL (ref 150–400)
RBC: 4.64 MIL/uL (ref 3.87–5.11)
RDW: 13.1 % (ref 11.5–15.5)
WBC: 6.8 10*3/uL (ref 4.0–10.5)

## 2015-09-07 LAB — COMPLETE METABOLIC PANEL WITH GFR
ALT: 22 U/L (ref 6–29)
AST: 19 U/L (ref 10–30)
Albumin: 4.3 g/dL (ref 3.6–5.1)
Alkaline Phosphatase: 33 U/L (ref 33–115)
BUN: 5 mg/dL — AB (ref 7–25)
CALCIUM: 9.6 mg/dL (ref 8.6–10.2)
CO2: 26 mmol/L (ref 20–31)
Chloride: 103 mmol/L (ref 98–110)
Creat: 0.41 mg/dL — ABNORMAL LOW (ref 0.50–1.10)
GFR, Est Non African American: >89 mL/min (ref >=60)
Glucose, Bld: 85 mg/dL (ref 70–99)
POTASSIUM: 4.2 mmol/L (ref 3.5–5.3)
Sodium: 140 mmol/L (ref 135–146)
Total Bilirubin: 0.6 mg/dL (ref 0.2–1.2)
Total Protein: 7.3 g/dL (ref 6.1–8.1)

## 2015-09-07 LAB — LIPID PANEL
CHOLESTEROL: 138 mg/dL (ref 125–200)
HDL: 41 mg/dL — ABNORMAL LOW (ref >=46)
LDL Cholesterol: 90 mg/dL (ref <130)
Total CHOL/HDL Ratio: 3.4 Ratio (ref <=5.0)
Triglycerides: 34 mg/dL (ref <150)
VLDL: 7 mg/dL (ref <30)

## 2015-09-07 LAB — TSH: TSH: 0.443 u[IU]/mL (ref 0.350–4.500)

## 2015-09-08 ENCOUNTER — Encounter: Payer: Self-pay | Admitting: Family Medicine

## 2015-09-20 ENCOUNTER — Other Ambulatory Visit: Payer: Self-pay | Admitting: Family Medicine

## 2015-09-20 NOTE — Telephone Encounter (Signed)
Refill appropriate and filled per protocol. 

## 2015-09-21 ENCOUNTER — Telehealth: Payer: Self-pay | Admitting: Family Medicine

## 2015-09-21 NOTE — Telephone Encounter (Signed)
Requesting refill on Clonazepam - ? OK to Refill  

## 2015-09-22 MED ORDER — CLONAZEPAM 1 MG PO TABS: 1.0000 mg | ORAL_TABLET | Freq: Every morning | ORAL | Status: DC

## 2015-09-22 NOTE — Telephone Encounter (Signed)
ok 

## 2015-09-22 NOTE — Telephone Encounter (Signed)
Medication called/sent to requested pharmacy  

## 2015-09-23 ENCOUNTER — Encounter (HOSPITAL_COMMUNITY): Payer: Self-pay | Admitting: Emergency Medicine

## 2015-09-23 ENCOUNTER — Other Ambulatory Visit: Payer: Self-pay | Admitting: Family Medicine

## 2015-09-23 ENCOUNTER — Emergency Department (INDEPENDENT_AMBULATORY_CARE_PROVIDER_SITE_OTHER)
Admission: EM | Admit: 2015-09-23 | Discharge: 2015-09-23 | Disposition: A | Discharge status: Home / Self Care | Payer: Medicare Other | Source: Home / Self Care | Attending: Family Medicine | Admitting: Family Medicine

## 2015-09-23 DIAGNOSIS — J9801 Acute bronchospasm: Secondary | ICD-10-CM

## 2015-09-23 DIAGNOSIS — J069 Acute upper respiratory infection, unspecified: Secondary | ICD-10-CM | POA: Diagnosis not present

## 2015-09-23 MED ORDER — IPRATROPIUM-ALBUTEROL 0.5-2.5 (3) MG/3ML IN SOLN
RESPIRATORY_TRACT | Status: AC
  Filled 2015-09-23: qty 3

## 2015-09-23 MED ORDER — IPRATROPIUM-ALBUTEROL 0.5-2.5 (3) MG/3ML IN SOLN
3.0000 mL | Freq: Once | RESPIRATORY_TRACT | Status: AC
  Administered 2015-09-23: 3 mL via RESPIRATORY_TRACT

## 2015-09-23 MED ORDER — PREDNISONE 20 MG PO TABS: ORAL_TABLET | ORAL | Status: DC

## 2015-09-23 MED ORDER — CETIRIZINE HCL 10 MG PO CAPS: ORAL_CAPSULE | ORAL | Status: DC

## 2015-09-23 MED ORDER — ALBUTEROL SULFATE HFA 108 (90 BASE) MCG/ACT IN AERS: 2.0000 | INHALATION_SPRAY | RESPIRATORY_TRACT | Status: DC | PRN

## 2015-09-23 MED ORDER — IPRATROPIUM BROMIDE 0.06 % NA SOLN: 2.0000 | Freq: Four times a day (QID) | NASAL | Status: DC

## 2015-09-23 NOTE — Discharge Instructions (Signed)
Bronchospasm, Adult A bronchospasm is when the tubes that carry air in and out of your lungs (airways) spasm or tighten. During a bronchospasm it is hard to breathe. This is because the airways get smaller. A bronchospasm can be triggered by:  Allergies. These may be to animals, pollen, food, or mold.  Infection. This is a common cause of bronchospasm.  Exercise.  Irritants. These include pollution, cigarette smoke, strong odors, aerosol sprays, and paint fumes.  Weather changes.  Stress.  Being emotional. HOME CARE   Always have a plan for getting help. Know when to call your doctor and local emergency services (911 in the [place] Know where you can get emergency care.  Only take medicines as told by your doctor.  If you were prescribed an inhaler or nebulizer machine, ask your doctor how to use it correctly. Always use a spacer with your inhaler if you were given one.  Stay calm during an attack. Try to relax and breathe more slowly.  Control your home environment:  Change your heating and air conditioning filter at least once a month.  Limit your use of fireplaces and wood stoves.  Do not  smoke. Do not  allow smoking in your home.  Avoid perfumes and fragrances.  Get rid of pests (such as roaches and mice) and their droppings.  Throw away plants if you see mold on them.  Keep your house clean and dust free.  Replace carpet with wood, tile, or vinyl flooring. Carpet can trap dander and dust.  Use allergy-proof pillows, mattress covers, and box spring covers.  Wash bed sheets and blankets every week in hot water. Dry them in a dryer.  Use blankets that are made of polyester or cotton.  Wash hands frequently. GET HELP IF:  You have muscle aches.  You have chest pain.  The thick spit you spit or cough up (sputum) changes from clear or white to yellow, green, gray, or bloody.  The thick spit you spit or cough up gets thicker.  There are problems that may be  related to the medicine you are given such as:  A rash.  Itching.  Swelling.  Trouble breathing. GET HELP RIGHT AWAY IF:  You feel you cannot breathe or catch your breath.  You cannot stop coughing.  Your treatment is not helping you breathe better.  You have very bad chest pain. MAKE SURE YOU:   Understand these instructions.  Will watch your condition.  Will get help right away if you are not doing well or get worse.   This information is not intended to replace advice given to you by your health care provider. Make sure you discuss any questions you have with your health care provider.   Document Released: 06/10/2009 Document Revised: 09/03/2014 Document Reviewed: 02/03/2013 Elsevier Interactive Patient Education 2016 [employer].  How to Use an Inhaler Using your inhaler correctly is very important. Good technique will make sure that the medicine reaches your lungs.  HOW TO USE AN INHALER:  Take the cap off the inhaler.  If this is the first time using your inhaler, you need to prime it. Shake the inhaler for 5 seconds. Release four puffs into the air, away from your face. Ask your doctor for help if you have questions.  Shake the inhaler for 5 seconds.  Turn the inhaler so the bottle is above the mouthpiece.  Put your pointer finger on top of the bottle. Your thumb holds the bottom of the inhaler.  Open your mouth.  Either hold the inhaler away from your mouth (the width of 2 fingers) or place your lips tightly around the mouthpiece. Ask your doctor which way to use your inhaler.  Breathe out as much air as possible.  Breathe in and push down on the bottle 1 time to release the medicine. You will feel the medicine go in your mouth and throat.  Continue to take a deep breath in very slowly. Try to fill your lungs.  After you have breathed in completely, hold your breath for 10 seconds. This will help the medicine to settle in your lungs. If you cannot hold  your breath for 10 seconds, hold it for as long as you can before you breathe out.  Breathe out slowly, through pursed lips. Whistling is an example of pursed lips.  If your doctor has told you to take more than 1 puff, wait at least 15-30 seconds between puffs. This will help you get the best results from your medicine. Do not use the inhaler more than your doctor tells you to.  Put the cap back on the inhaler.  Follow the directions from your doctor or from the inhaler package about cleaning the inhaler. If you use more than one inhaler, ask your doctor which inhalers to use and what order to use them in. Ask your doctor to help you figure out when you will need to refill your inhaler.  If you use a steroid inhaler, always rinse your mouth with water after your last puff, gargle and spit out the water. Do not swallow the water. GET HELP IF:  The inhaler medicine only partially helps to stop wheezing or shortness of breath.  You are having trouble using your inhaler.  You have some increase in thick spit (phlegm). GET HELP RIGHT AWAY IF:  The inhaler medicine does not help your wheezing or shortness of breath or you have tightness in your chest.  You have dizziness, headaches, or fast heart rate.  You have chills, fever, or night sweats.  You have a large increase of thick spit, or your thick spit is bloody. MAKE SURE YOU:   Understand these instructions.  Will watch your condition.  Will get help right away if you are not doing well or get worse.   This information is not intended to replace advice given to you by your health care provider. Make sure you discuss any questions you have with your health care provider.   Document Released: 05/22/2008 Document Revised: 06/03/2013 Document Reviewed: 03/12/2013 Elsevier Interactive Patient Education 2016 [employer] Inc[employer]  Upper Respiratory Infection, Adult Zyrtec for drainage and runny nose Can use lots of saline nasal  spray Atrovent nasal spray as directed Most upper respiratory infections (URIs) are caused by a virus. A URI affects the nose, throat, and upper air passages. The most common type of URI is often called "the common cold." HOME CARE   Take medicines only as told by your doctor.  Gargle warm saltwater or take cough drops to comfort your throat as told by your doctor.  Use a warm mist humidifier or inhale steam from a shower to increase air moisture. This may make it easier to breathe.  Drink enough fluid to keep your pee (urine) clear or pale yellow.  Eat soups and other clear broths.  Have a healthy diet.  Rest as needed.  Go back to work when your fever is gone or your doctor says it is okay.  You may need  to stay home longer to avoid giving your URI to others.  You can also wear a face mask and wash your hands often to prevent spread of the virus.  Use your inhaler more if you have asthma.  Do not use any tobacco products, including cigarettes, chewing tobacco, or electronic cigarettes. If you need help quitting, ask your doctor. GET HELP IF:  You are getting worse, not better.  Your symptoms are not helped by medicine.  You have chills.  You are getting more short of breath.  You have brown or red mucus.  You have yellow or brown discharge from your nose.  You have pain in your face, especially when you bend forward.  You have a fever.  You have puffy (swollen) neck glands.  You have pain while swallowing.  You have white areas in the back of your throat. GET HELP RIGHT AWAY IF:   You have very bad or constant:  Headache.  Ear pain.  Pain in your forehead, behind your eyes, and over your cheekbones (sinus pain).  Chest pain.  You have long-lasting (chronic) lung disease and any of the following:  Wheezing.  Long-lasting cough.  Coughing up blood.  A change in your usual mucus.  You have a stiff neck.  You have changes in  your:  Vision.  Hearing.  Thinking.  Mood. MAKE SURE YOU:   Understand these instructions.  Will watch your condition.  Will get help right away if you are not doing well or get worse.   This information is not intended to replace advice given to you by your health care provider. Make sure you discuss any questions you have with your health care provider.   Document Released: 01/30/2008 Document Revised: 12/28/2014 Document Reviewed: 11/18/2013 Elsevier Interactive Patient Education [employer]

## 2015-09-23 NOTE — ED Notes (Signed)
The patient presented to the [facility] with her family with a complaint of coughing, chest wall pain, and congestion that has been occurring for 3 days. She also stated that today she started vomiting.

## 2015-09-23 NOTE — ED Provider Notes (Signed)
CSN: [id number]     Arrival date & time 09/23/15  1939 History   First MD Initiated Contact with Patient 09/23/15 1953     Chief Complaint  Patient presents with  . Cough  . Pleurisy  . Nasal Congestion   (Consider location/radiation/quality/duration/timing/severity/associated sxs/prior Treatment) HPI Comments: 25 year old female found to wheelchair secondary to complications of cerebral palsy and mental retardation presents with parents with complaints of chest pain associated with coughing, frequent coughing and coughing spasms, head congestion and runny nose for 3 days. They also note she has had a fever. She has vomited twice today, believed to be associated with coughing spasms.   Past Medical History  Diagnosis Date  . CP (cerebral palsy) (HCC)     with mild contracture  . MR (mental retardation)   . Mood disorder (HCC)   . Peripheral vascular disease (HCC) 10/02/2006    bilateral DVT lower legs  . Wheelchair bound     r/t CP  . Seizures (HCC)     childhood  . Menorrhagia with regular cycle 11/11/2012  . Mobility impaired 11/11/2012  . IUD (intrauterine device) in place 10/2012  . Anemia     h/o iron deficiency   Past Surgical History  Procedure Laterality Date  . Multiple tooth extractions  10/18/10  . Leg surgery      as a child  . Eye surgery      as a child  . Intrauterine device (iud) insertion N/A 11/12/2012    Procedure: INTRAUTERINE DEVICE (IUD) INSERTION;  Surgeon: Sherron Monday, MD;  Location: [facility];  Service: Gynecology;  Laterality: N/A;  . Hip surgery Right 2003   Family History  Problem Relation Age of Onset  . Cancer Mother   . Diabetes Father   . Alzheimer's disease Maternal Grandfather   . Heart attack Paternal Grandmother     Died between 8 or 20 years old   Social History  Substance Use Topics  . Smoking status: Never Smoker   . Smokeless tobacco: Never Used  . Alcohol Use: No     Comment: Pt denies   OB History    No data available      Review of Systems  Constitutional: Positive for fever. Negative for activity change.  HENT: Positive for congestion, postnasal drip and rhinorrhea. Negative for ear pain and sore throat.   Respiratory: Positive for cough and wheezing. Negative for shortness of breath.   Cardiovascular: Positive for chest pain.  Gastrointestinal: Positive for vomiting. Negative for abdominal pain.  Genitourinary: Negative.   Neurological: Negative.     Allergies  Review of patient's allergies indicates no known allergies.  Home Medications   Prior to Admission medications   Medication Sig Start Date End Date Taking? Authorizing Provider  ARIPiprazole (ABILIFY) 2 MG tablet TAKE ONE TABLET BY MOUTH ONCE DAILY 09/20/15  Yes Donita Brooks, MD  clonazePAM (KLONOPIN) 1 MG tablet Take 1 tablet (1 mg total) by mouth every morning. 09/22/15  Yes Donita Brooks, MD  glycopyrrolate (ROBINUL) 1 MG tablet TAKE ONE TABLET TWICE DAILY 03/29/15  Yes Donita Brooks, MD  albuterol (PROVENTIL HFA;VENTOLIN HFA) 108 (90 Base) MCG/ACT inhaler Inhale 2 puffs into the lungs every 4 (four) hours as needed for wheezing or shortness of breath. 09/23/15   Hayden Rasmussen, NP  Cetirizine HCl 10 MG CAPS Take one tablet daily prn runny nose and drainage. 09/23/15   Hayden Rasmussen, NP  ipratropium (ATROVENT) 0.06 % nasal spray Place 2 sprays into  both nostrils 4 (four) times daily. 09/23/15   Hayden Rasmussen, NP  predniSONE (DELTASONE) 20 MG tablet Take 3 tabs po on first day, 2 tabs second day, 2 tabs third day, 1 tab fourth day, 1 tab 5th day. Take with food. 09/23/15   Hayden Rasmussen, NP   Meds Ordered and Administered this Visit   Medications  ipratropium-albuterol (DUONEB) 0.5-2.5 (3) MG/3ML nebulizer solution 3 mL (3 mLs Nebulization Given 09/23/15 2013)    BP 110/55 mmHg  Pulse 109  Temp(Src) 99.1 F (37.3 C) (Oral)  Resp 18  SpO2 100% No data found.   Physical Exam  Constitutional: She appears well-nourished. No distress.  HENT:   Bilateral TMs are normal although partially obscured by cerumen. Oropharynx with minor erythema, cobblestoning and clear PND. No exudates.  Eyes: Conjunctivae and EOM are normal.  Neck: Normal range of motion. Neck supple.  Cardiovascular: Normal rate, regular rhythm and normal heart sounds.   Pulmonary/Chest:  Patient was coughing the entire time examiner was auscultating the lungs. There is much coarseness associated with coughing. She was unable to take a deep breath without coughing.  Lymphadenopathy:    She has no cervical adenopathy.  Neurological: She is alert. She exhibits normal muscle tone.  Skin: Skin is warm and dry.  Nursing note and vitals reviewed.   ED Course  Procedures (including critical care time)  Labs Review Labs Reviewed - No data to display  Imaging Review No results found.   Visual Acuity Review  Right Eye Distance:   Left Eye Distance:   Bilateral Distance:    Right Eye Near:   Left Eye Near:    Bilateral Near:         MDM   1. URI (upper respiratory infection)   2. Cough due to bronchospasm    DuoNeb 2.54/5 mg administered. Post DuoNeb there appears to be modest to mild improvement. Whenever auscultating the lungs the patient starts to cough frequently and will not take a breath without coughing. Once I walk away the patient will stop coughing, this happens repetitively. Tylenol q4h prn fever. If there is any increase in fever, shortness of breath worsening cough, lethargy or other problems seek all are more medical attention promptly. Meds ordered this encounter  Medications  . ipratropium-albuterol (DUONEB) 0.5-2.5 (3) MG/3ML nebulizer solution 3 mL    Sig:   . albuterol (PROVENTIL HFA;VENTOLIN HFA) 108 (90 Base) MCG/ACT inhaler    Sig: Inhale 2 puffs into the lungs every 4 (four) hours as needed for wheezing or shortness of breath.    Dispense:  1 Inhaler    Refill:  0    Order Specific Question:  Supervising Provider    Answer:   Bradd Canary D [id number]  . predniSONE (DELTASONE) 20 MG tablet    Sig: Take 3 tabs po on first day, 2 tabs second day, 2 tabs third day, 1 tab fourth day, 1 tab 5th day. Take with food.    Dispense:  9 tablet    Refill:  0    Order Specific Question:  Supervising Provider    Answer:  Linna Hoff [phone]  . Cetirizine HCl 10 MG CAPS    Sig: Take one tablet daily prn runny nose and drainage.    Dispense:  30 capsule    Refill:  0    Order Specific Question:  Supervising Provider    Answer:  Linna Hoff [phone]  . ipratropium (ATROVENT) 0.06 % nasal spray  Sig: Place 2 sprays into both nostrils 4 (four) times daily.    Dispense:  15 mL    Refill:  0    Order Specific Question:  Supervising Provider    Answer:  Linna Hoff [id number]]       Hayden Rasmussen, NP 09/23/15 2043

## 2015-09-23 NOTE — Telephone Encounter (Signed)
rx req for Clonazepam.  This was called in yesterday and pharmacy was called and rx verified

## 2015-09-27 ENCOUNTER — Encounter: Payer: Self-pay | Admitting: Family Medicine

## 2015-09-27 ENCOUNTER — Ambulatory Visit (INDEPENDENT_AMBULATORY_CARE_PROVIDER_SITE_OTHER): Payer: Medicare Other | Admitting: Family Medicine

## 2015-09-27 VITALS — BP 110/78 | HR 100 | Temp 98.3°F | Resp 26

## 2015-09-27 DIAGNOSIS — J209 Acute bronchitis, unspecified: Secondary | ICD-10-CM | POA: Diagnosis not present

## 2015-09-27 MED ORDER — GUAIFENESIN-CODEINE 100-10 MG/5ML PO SOLN: 5.0000 mL | Freq: Four times a day (QID) | ORAL | Status: DC | PRN

## 2015-09-27 MED ORDER — AZITHROMYCIN 250 MG PO TABS: ORAL_TABLET | ORAL | Status: DC

## 2015-09-27 NOTE — Patient Instructions (Signed)
Take antibiotics  Cough medicine as prescribed F/U as needed

## 2015-09-27 NOTE — Progress Notes (Signed)
Patient ID: Nancy [name], female   DOB: 1991-04-21, 25 y.o.   MRN: [id number]   Subjective:    Patient ID: Nancy [name], female    DOB: [birth year], 25 y.o.   MRN: [id number]  Patient presents for Illness   Patient here with cough with congestion he was seen in the urgent care actually on January 27 at that time diagnosed with upper respiratory and mild bronchospasm. She was given duo nebs in the office and then sent home with albuterol, prednisone Zyrtec and Atrovent nasal spray. She is history of cerebral palsy. He continues to have coughing fits she had 1 episode of posttussive emesis. She still has sputum but she has drooling and secretions at baseline because of her cerebral palsy. She is here today with her father who thinks that she is minimally improved. She is also finishing prednisone tonight. The albuterol does help. He is also being given her Delsym    Review Of Systems:  GEN- denies fatigue, fever, weight loss,weakness, recent illness HEENT- denies eye drainage, change in vision, +nasal discharge, CVS- denies chest pain, palpitations RESP- denies SOB,+ cough, wheeze ABD- denies N/V, change in stools, abd pain Neuro- denies headache, dizziness, syncope, seizure activity       Objective:    BP 110/78 mmHg  Pulse 100  Temp(Src) 98.3 F (36.8 C) (Oral)  Resp 26  SpO2 99% GEN- NAD, alert and oriented x3 HEENT- PERRL, EOMI, non injected sclera, pink conjunctiva, MMM, oropharynx clear, +rhinorrhea Neck- Supple, no LAD CVS- RRR, no murmur RESP-coughing fits during exam(gets worked up), no wheeze,+rhonchi, clears some with cough, no rales  ABD-NABS,soft,NT,ND EXT- mild contractures, in wheelchair          Assessment & Plan:      Problem List Items Addressed This Visit    None    Visit Diagnoses    Acute bronchitis, unspecified organism    -  Primary    treatment for bronchitis. As she has a cerebral palsy and difficulty with her secretions I think  she is higher risk for pneumonia. I will give her azithromycin , Robitussin with codeine. She will continue the albuterol and complete her prednisone tonight. Her saturations are normal. I will hold on chest x-ray today. No fever  Note she can use Delsym during the day and the Robitussin with codeine in the evening       Note: This dictation was prepared with Dragon dictation along with smaller phrase technology. Any transcriptional errors that result from this process are unintentional.

## 2015-10-03 IMAGING — CT CT HEAD W/O CM
2 series · 16 of 30 positions shown, 19 images · non-contrast
Comparison: None.

CLINICAL DATA: Altered mental status, agitation

EXAM:
CT HEAD WITHOUT CONTRAST
TECHNIQUE: Contiguous axial images were obtained from the base of the skull
through the vertex without intravenous contrast.

[Series 2: head w/o · axial · non-contrast · 0.42mm/px · z∈[-164,-39]mm · 9 of 33 slices shown, 12 images]
[im 4/33  brain]
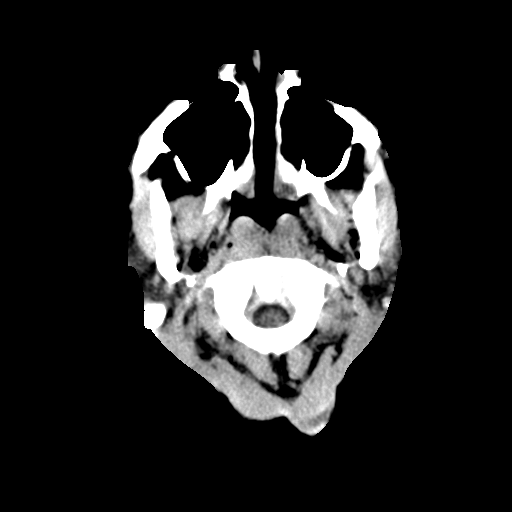
[im 4/33  bone]
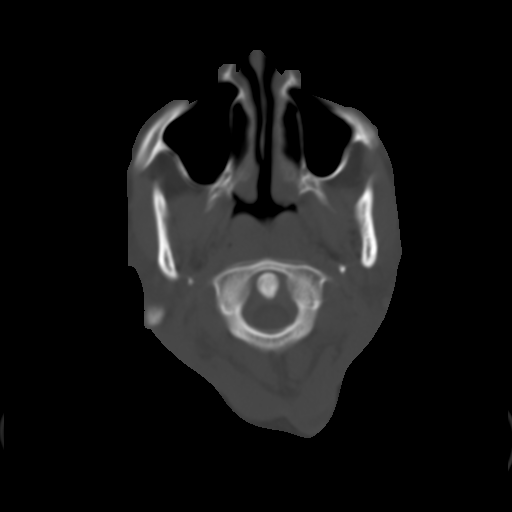
[im 7/33  brain]
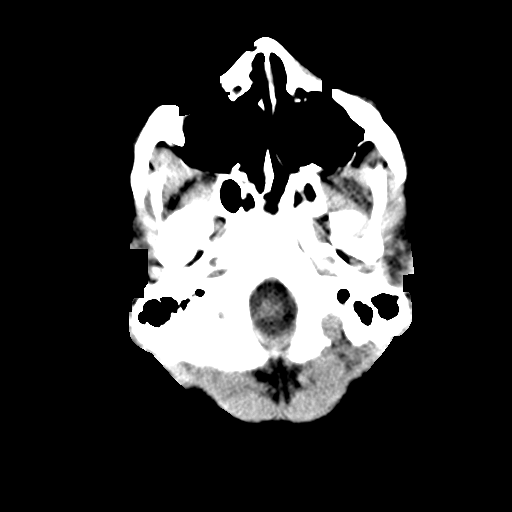
[im 10/33  brain]
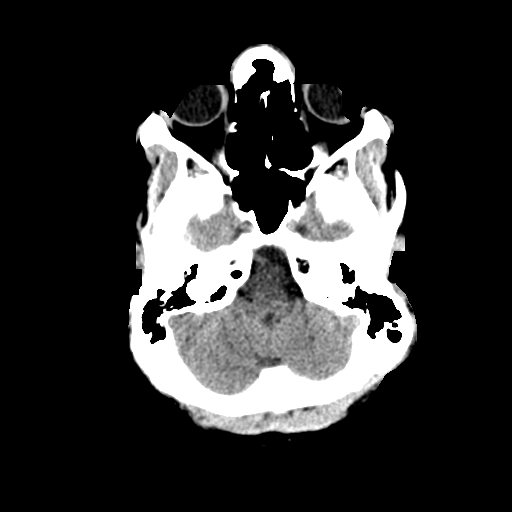
[im 13/33  brain]
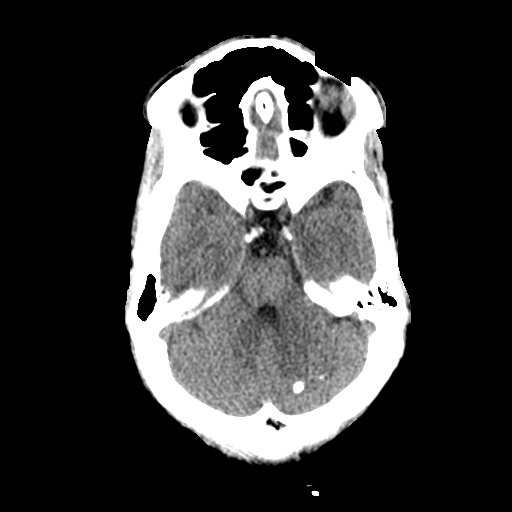
[im 17/33  brain]
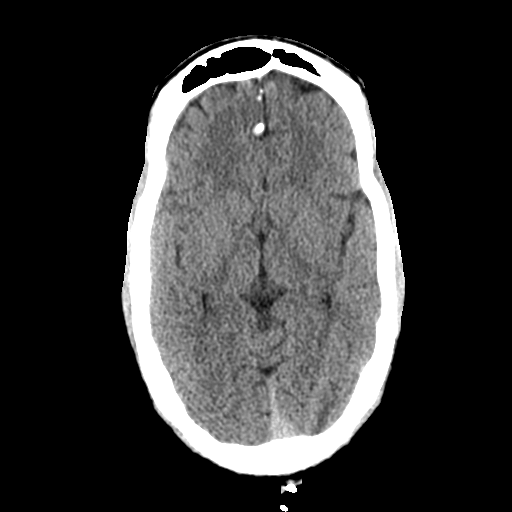
[im 17/33  bone]
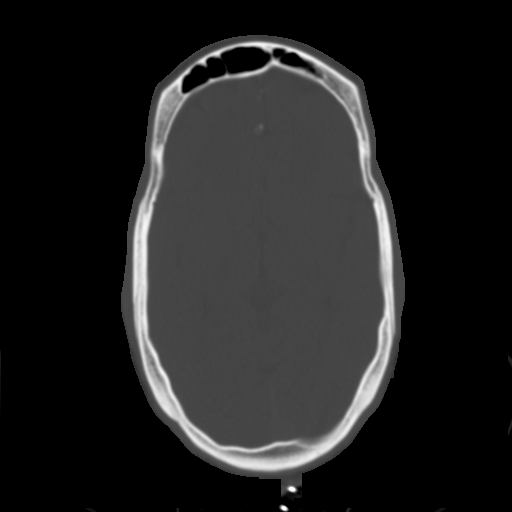
[im 20/33  brain]
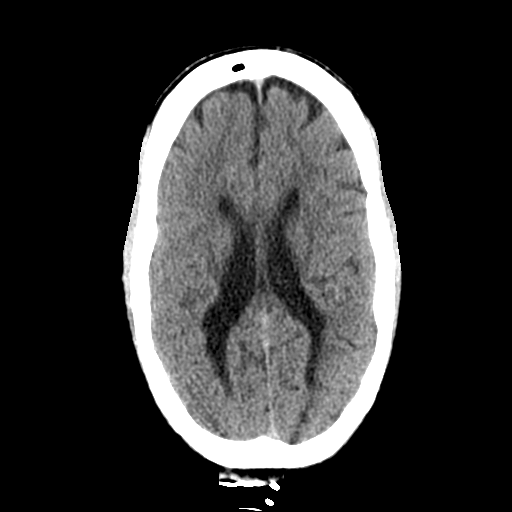
[im 23/33  brain]
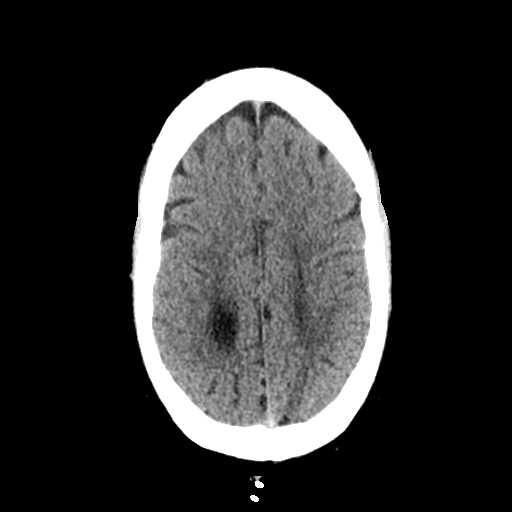
[im 26/33  brain]
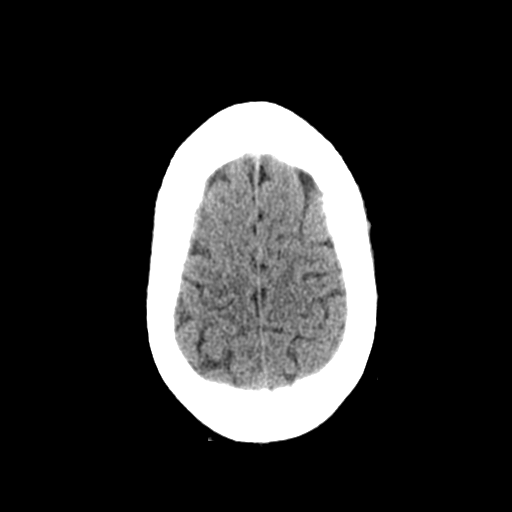
[im 29/33  brain]
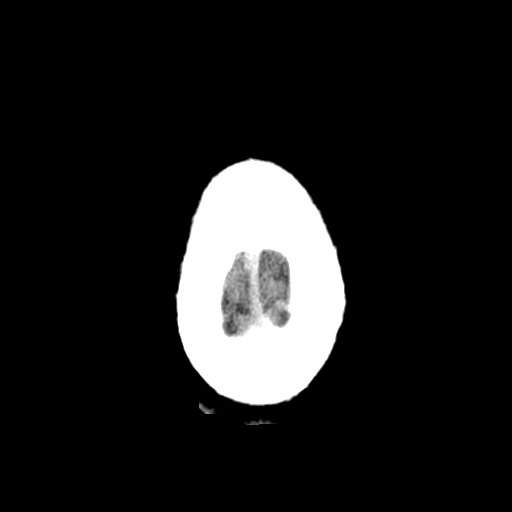
[im 29/33  bone]
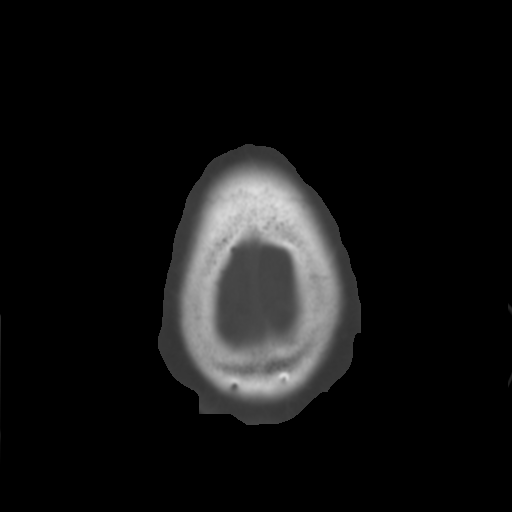

[Series 3: bone windows · axial · 0.42mm/px · z∈[-161,-53]mm · 7 of 55 slices shown]
[im 7/55  bone]
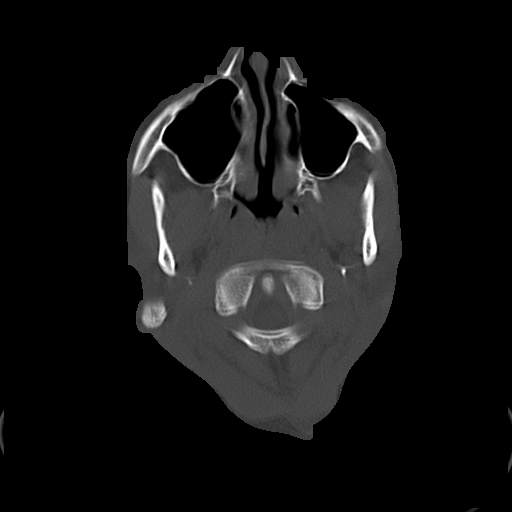
[im 13/55  bone]
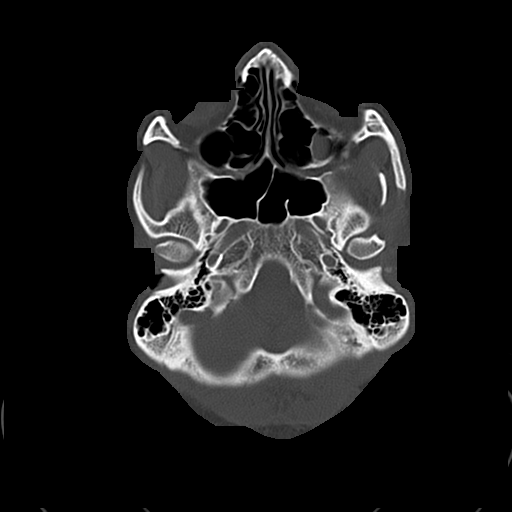
[im 19/55  bone]
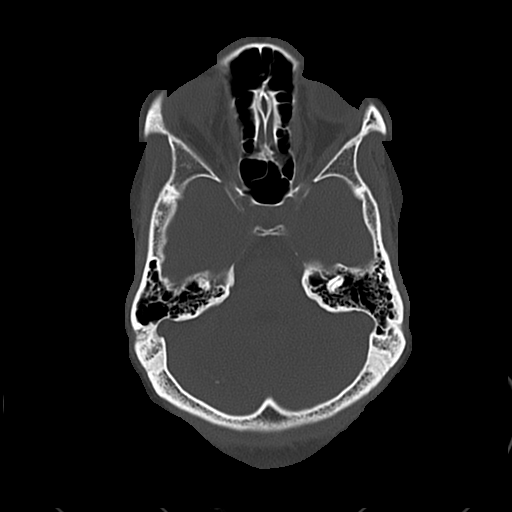
[im 25/55  bone]
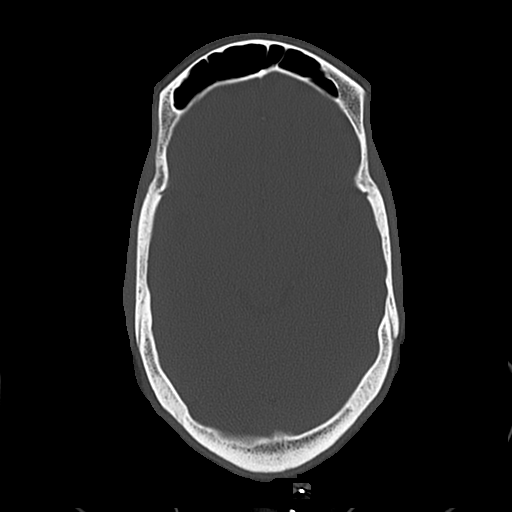
[im 31/55  bone]
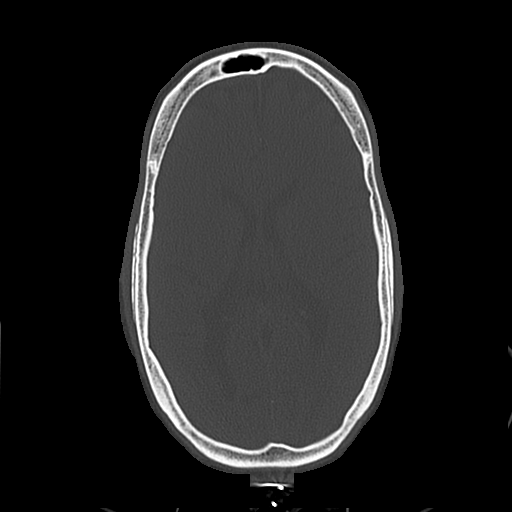
[im 37/55  bone]
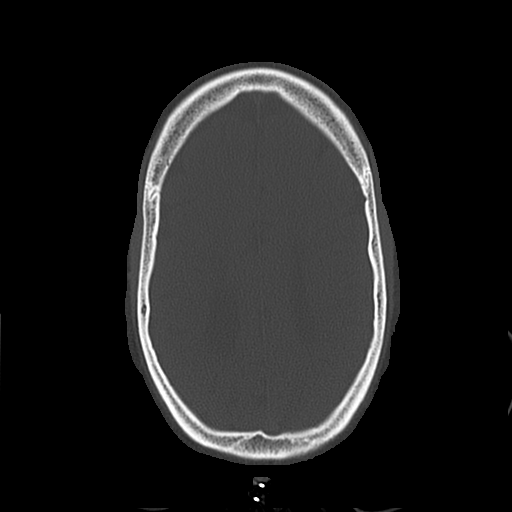
[im 43/55  bone]
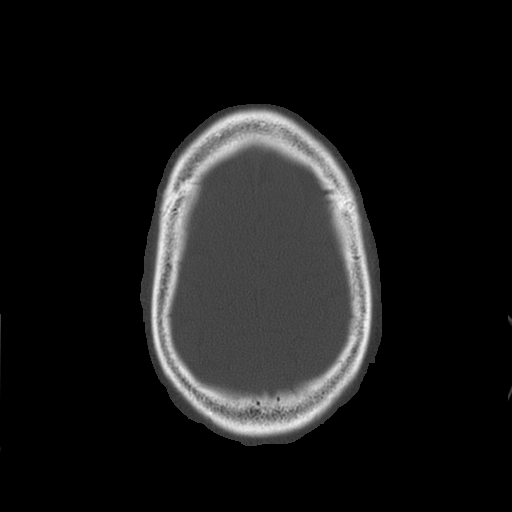

[16 of 30 positions shown; findings below may reference images not displayed]

FINDINGS: Motion degraded images.

No evidence of parenchymal hemorrhage or extra-axial fluid
collection. No mass lesion, mass effect, or midline shift.

No CT evidence of acute infarction.

Mild cortical atrophy. No ventriculomegaly. Angular configuration of
the posterior horn of the lateral ventricles (series 2/image 20)
suggests sequela of prenatal periventricular leukomalacia.
Associated cerebellar calcifications (series 2/image 13), possibly
sequela of perinatal hemorrhage.

The visualized paranasal sinuses are essentially clear. The mastoid
air cells are unopacified.

No evidence of calvarial fracture.
IMPRESSION: Motion degraded images.

No evidence of acute intracranial abnormality.

Suspected sequela of prior periventricular leukomalacia.

## 2015-10-03 IMAGING — CR DG CHEST 2V
2 series · 2 of 2 positions shown · non-contrast
Comparison: [DATE] [DATE] rate

CLINICAL DATA: None eating and drinking.

EXAM:
CHEST  2 VIEW

[w chest lat]
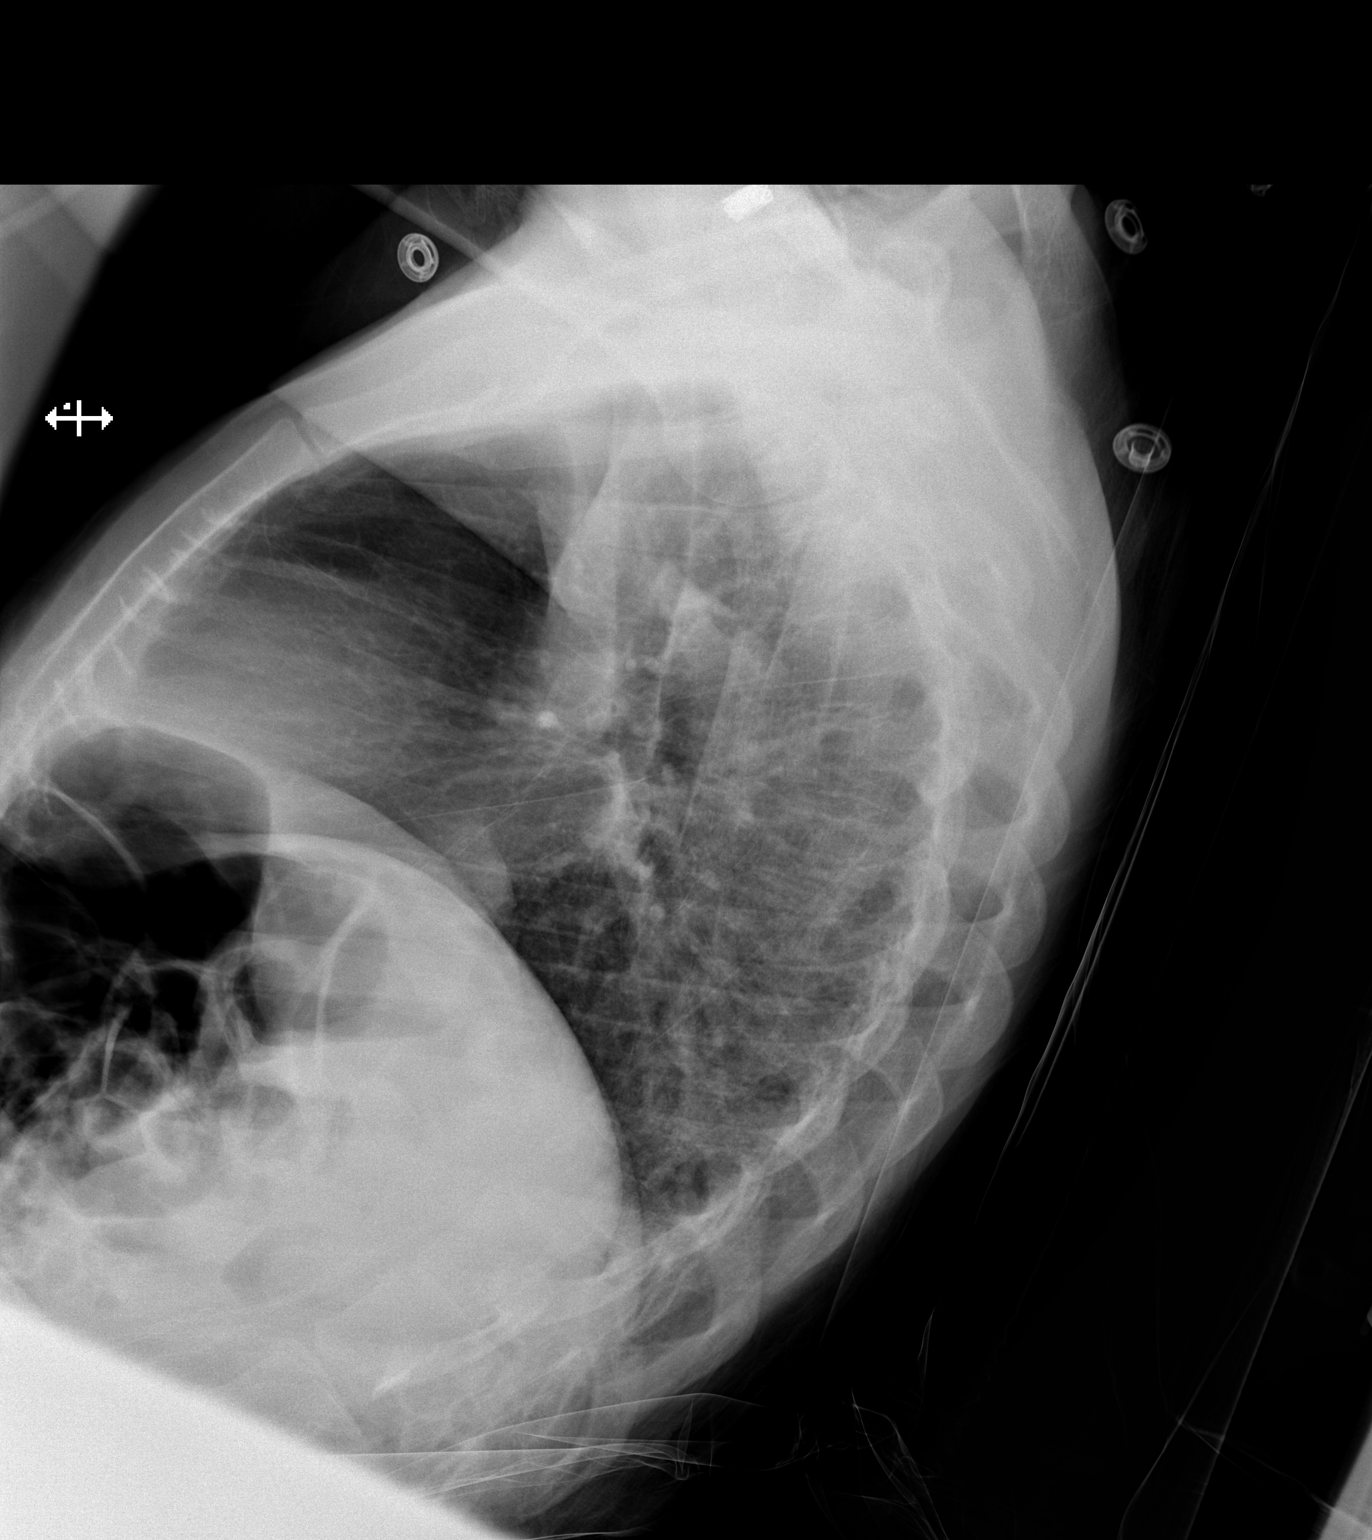

[x chest ap]
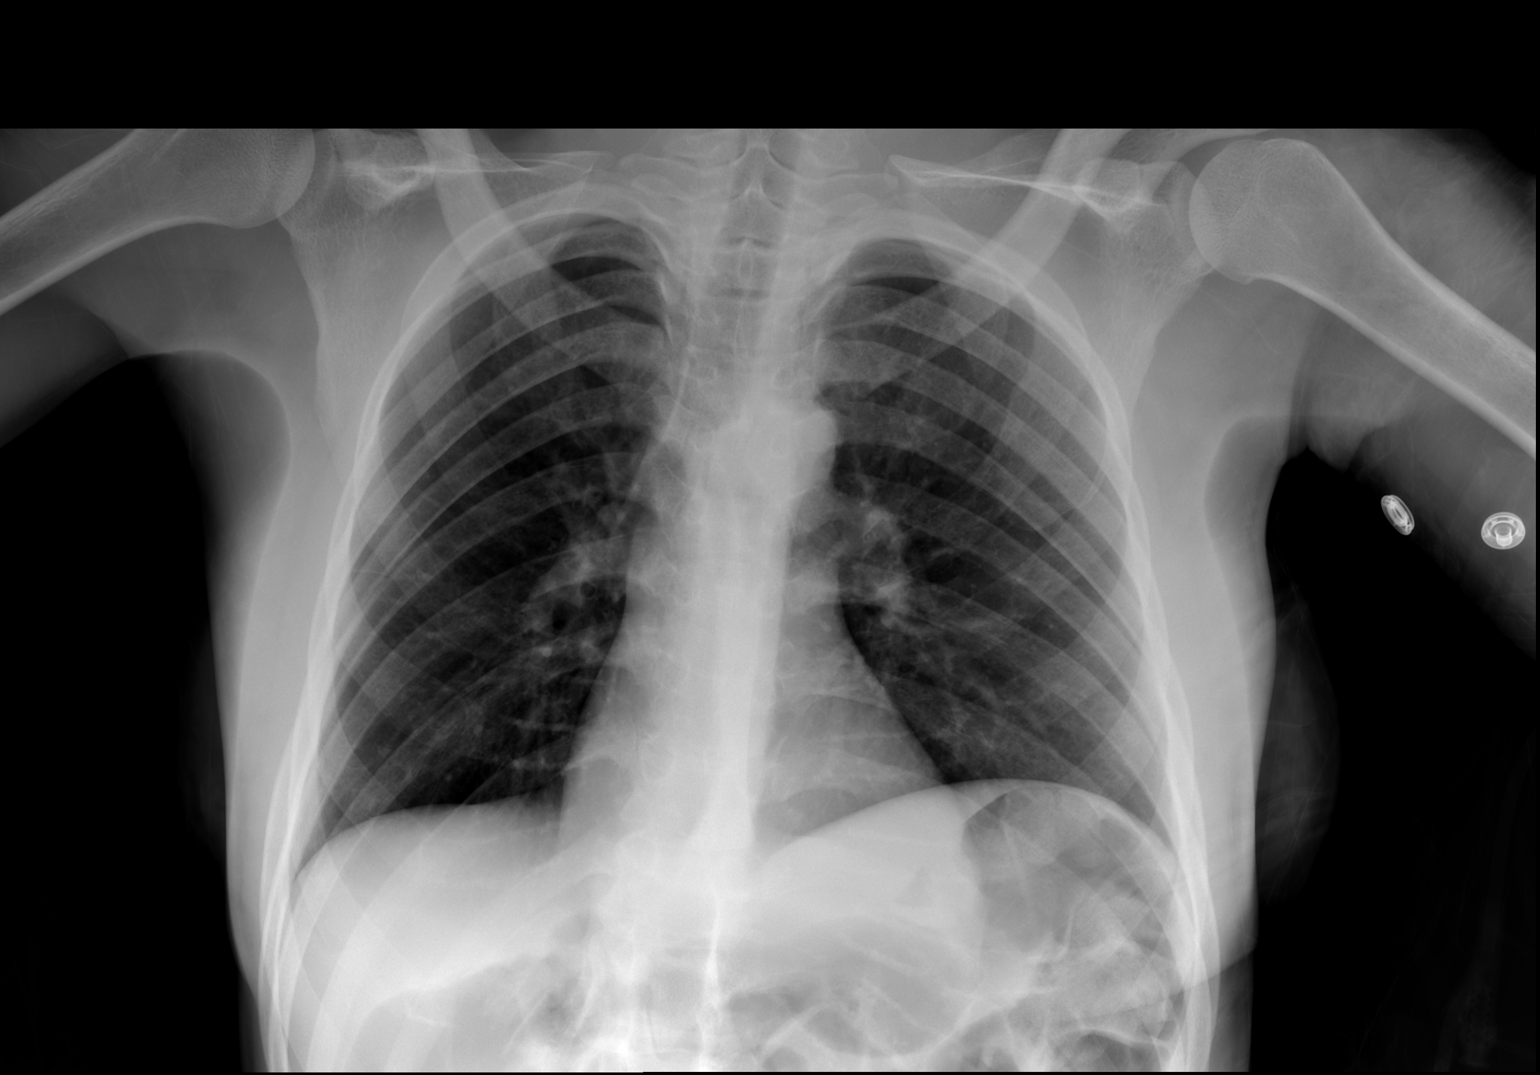

[2 of 2 positions shown; findings below may reference images not displayed]

FINDINGS: The heart size and mediastinal contours are within normal limits.
Both lungs are clear. The visualized skeletal structures are
unremarkable.
IMPRESSION: No active cardiopulmonary disease.

## 2015-10-06 DIAGNOSIS — G839 Paralytic syndrome, unspecified: Secondary | ICD-10-CM | POA: Diagnosis not present

## 2015-10-06 DIAGNOSIS — N920 Excessive and frequent menstruation with regular cycle: Secondary | ICD-10-CM | POA: Diagnosis not present

## 2015-10-06 DIAGNOSIS — Z3040 Encounter for surveillance of contraceptives, unspecified: Secondary | ICD-10-CM | POA: Diagnosis not present

## 2015-11-18 ENCOUNTER — Other Ambulatory Visit: Payer: Self-pay | Admitting: Family Medicine

## 2015-11-18 NOTE — Telephone Encounter (Signed)
Refill appropriate and filled per protocol. 

## 2015-11-22 ENCOUNTER — Ambulatory Visit (INDEPENDENT_AMBULATORY_CARE_PROVIDER_SITE_OTHER): Payer: Medicare Other | Admitting: Family Medicine

## 2015-11-22 ENCOUNTER — Encounter: Payer: Self-pay | Admitting: Family Medicine

## 2015-11-22 VITALS — BP 122/70 | HR 86 | Temp 97.6°F | Resp 18

## 2015-11-22 DIAGNOSIS — Z79899 Other long term (current) drug therapy: Secondary | ICD-10-CM

## 2015-11-22 DIAGNOSIS — F259 Schizoaffective disorder, unspecified: Secondary | ICD-10-CM | POA: Diagnosis not present

## 2015-11-22 DIAGNOSIS — G8250 Quadriplegia, unspecified: Secondary | ICD-10-CM

## 2015-11-22 DIAGNOSIS — Z993 Dependence on wheelchair: Secondary | ICD-10-CM | POA: Diagnosis not present

## 2015-11-22 NOTE — Progress Notes (Signed)
Subjective:    Patient ID: Nancy [name], female    DOB: 1991-05-17, 25 y.o.   MRN: [id number]  HPI  09/28/14 Patient is a 25 year old African-American female who is being seen today for a face-to-face evaluation for power wheelchair. 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 She needs a replacement for her current wheelchair as she has had her current wheelchair for over 5 years.   It is in need of replacement due to wear and tear from normal use. The battery cover has fallen off the back of the wheelchair. The toggle that operates a wheelchair is missing the knob. Several pieces are in disrepair on the wheelchair. Also her wheelchairs power seating is not working consistently. Furthermore the wheelchair no longer meets her positioning needs. She's been evaluated by physical therapist at [facility]. They determined that the patient has poor static and dynamic sitting balance, decreased rate, decreased range of motion and requires maximum assistance for all ADLs.   The patient is not able to functionally ambulate independently or with assistance due to her spastic quadriplegia. She does not have sufficient strength in her lower extremities to even bear weight on her legs. She also does not have sufficient strength in her upper extremities to operate a manual wheelchair. In fact due to spasticity,  She is unable to fully extend her arms at the elbows. Furthermore her muscle strength is at best 3 over 5 in the upper extremities against resistance. Therefore I do not believe that with the contractures of her hands in her upper extremities she would be able to operate a manual wheelchair. Patient is unable to walk at all. Therefore she will  Relies on power mobility for all her functional mobility.   she also has schizoaffective disorder with bipolar tendencies with paranoia and psychosis. This is currently being well managed with Abilify.    There is no evidence of depression or anxiety at the present time. She denies any hallucinations or delusions. There are no violent outburst. She is overdue for fasting lab work. Her flu shot is up-to-date.  At that time, my plan was:   patient requires a power wheelchair due to her spastic quadriparesis stemming from her cerebral palsy. Her current wheelchair is over 14 years old and is in disrepair from typical wear and tear. The wheelchair is no longer meeting her functional needs or positional needs. I will complete the necessary paperwork and fax this to her medical equipment company. I also have asked the patient to return fasting for a CMP as well as fasting lipid panel to monitor dyslipidemia stemming from her Abilify. Clinically the medicine is working fine.  11/15/25 She is here today for fasting lab work to monitor for complications stemming from the abilify.  Otherwise she is doing well.  At that time, my plan was: obtain CBC, CMP, fasting lipid panel, and hemoglobin A1c to evaluate for dyslipidemia, hyperlipidemia, and hyperglycemia on Abilify  05/27/15 Patient is here today for follow-up. She seems to be drooling more excessively than in the past. Throughout her entire encounter today, her father is constantly having to wipe the drool from her mouth. She is no longer living in a group home. She lives at home with her father. During the day she goes to school and is in a work program. She is self-conscious and she is very concerned about the drooling and being embarrassed by this. She also reports difficulty going to the bathroom. She  is not constipated. Instead, she reports having stool that is mushy and constantly leaks out.  Partly, this is due to diminished rectal tone. However she does not have formed bowel movements. She cannot wipe enough to get herself cleaned. She is not taking any kind of fiber supplement.  She also complains of vocal tics and stuttering ever since starting the Abilify.  Patient has been stable now for more than a year. She had schizoaffective disorder bipolar type with mood swings, hallucinations, and violent outburst. She is interested in stopping the medication.  At that time, my plan was: For the patient's drooling, I wouldn't increase glycopyrrolate to its highest dose, 1 mg by mouth 3 times a day. I would recommend Metamucil or some type of fiber  Supplement as a bulking agent to hopefully create more formed bowel movements. I will recommend decreasing Abilify from 5 mg a day to 2 mg a day. However I would not discontinue the medication because I am worried about relapse. Patient also received her flu shot today  09/06/15  She is here today for recheck. She is doing very well on Abilify 2 mg by mouth daily.   She denies any side effects from the medication. She denies any hallucinations. She denies any delusions. She denies any aggressive behavior. She denies any mood swings while outburst. She is accompanied by her father agrees. She does have occasional  Sad days. These typically last 1-2 days and then resolve spontaneously.  Frequently this S doing the patient becomes frustrated by her medical problems. However she denies any depression or suicidal ideation. She is interested in possibly stopping the medication in the future. She also has had one episode of trace blood in her stool. This occurred yesterday. It was just a little bit of blood when they wiped. There are no hemorrhoids or pain with defecation. She denies any diarrhea or constipation.  At that time my plan was:  schizoaffective disorder is well controlled. I will continue Abilify for the present time. Given the nature of the medication, I will check her liver function tests, her fasting blood sugar, profile, and her cholesterol. I believe the blood that they saw yesterday was just irritation due to wiping. If this persists, we could workup further but at the present time I see no indication for  anoscopy.   11/22/15 Patient is requesting parts for her motorized wheelchair. She needs a seatbelt as her current seatbelt is completely worn out and no longer sufficient. Also the positioning pads that position her trunk and her torso have broken off the wheelchair. This causes the patient to slow down in the wheelchair and sometimes even slide out of the wheelchair. Therefore she needs replacement on her seatbelt as well as her positioning pads. I will be glad to fill out any necessary paperwork for this. However she also wants to discontinue the Abilify. She's been on the Abilify now for a prolonged period of time. She denies any side effects on the medication. She denies any depression. She denies any hallucinations. She denies any paranoia or delusions. Her father is convinced that it has to do with the transition that was going on in the past that was causing her to act out. He does not believe that she has schizoaffective disorder. Neither the patient nor her father believe she requires the Abilify Past Medical History  Diagnosis Date  . CP (cerebral palsy) (HCC)     with mild contracture  . MR (mental retardation)   .  Mood disorder (HCC)   . Peripheral vascular disease (HCC) 10/02/2006    bilateral DVT lower legs  . Wheelchair bound     r/t CP  . Seizures (HCC)     childhood  . Menorrhagia with regular cycle 11/11/2012  . Mobility impaired 11/11/2012  . IUD (intrauterine device) in place 10/2012  . Anemia     h/o iron deficiency   Past Surgical History  Procedure Laterality Date  . Multiple tooth extractions  10/18/10  . Leg surgery      as a child  . Eye surgery      as a child  . Intrauterine device (iud) insertion N/A 11/12/2012    Procedure: INTRAUTERINE DEVICE (IUD) INSERTION;  Surgeon: Sherron MondayJody Bovard, MD;  Location: [facility];  Service: Gynecology;  Laterality: N/A;  . Hip surgery Right 2003   Current Outpatient Prescriptions on File Prior to Visit  Medication Sig Dispense Refill   . albuterol (PROVENTIL HFA;VENTOLIN HFA) 108 (90 Base) MCG/ACT inhaler Inhale 2 puffs into the lungs every 4 (four) hours as needed for wheezing or shortness of breath. 1 Inhaler 0  . ARIPiprazole (ABILIFY) 2 MG tablet TAKE ONE TABLET BY MOUTH ONCE DAILY 30 tablet 3  . Cetirizine HCl 10 MG CAPS Take one tablet daily prn runny nose and drainage. 30 capsule 0  . clonazePAM (KLONOPIN) 1 MG tablet Take 1 tablet (1 mg total) by mouth every morning. 30 tablet 2  . glycopyrrolate (ROBINUL) 1 MG tablet TAKE ONE TABLET THREE TIMES DAILY 90 tablet 3  . ipratropium (ATROVENT) 0.06 % nasal spray Place 2 sprays into both nostrils 4 (four) times daily. 15 mL 0   No current facility-administered medications on file prior to visit.   No Known Allergies Social History   Social History  . Marital Status: Single    Spouse Name: N/A  . Number of Children: N/A  . Years of Education: N/A   Occupational History  . Not on file.   Social History Main Topics  . Smoking status: Never Smoker   . Smokeless tobacco: Never Used  . Alcohol Use: No     Comment: Pt denies  . Drug Use: No     Comment: Pt denies  . Sexual Activity: No     Comment: lives in a group home.  single.   Other Topics Concern  . Not on file   Social History Narrative     Review of Systems  All other systems reviewed and are negative.      Objective:   Physical Exam  Constitutional: She is oriented to person, place, and time. She appears well-developed and well-nourished.  Cardiovascular: Normal rate, regular rhythm and normal heart sounds.  Exam reveals no gallop and no friction rub.   No murmur heard. Pulmonary/Chest: Effort normal and breath sounds normal. No respiratory distress. She has no wheezes. She has no rales.  Abdominal: Soft. Bowel sounds are normal.  Neurological: She is alert and oriented to person, place, and time. She exhibits abnormal muscle tone. Coordination abnormal.  Reflex Scores:      Tricep reflexes  are 3+ on the right side and 3+ on the left side.      Bicep reflexes are 3+ on the right side and 3+ on the left side.      Brachioradialis reflexes are 3+ on the right side and 3+ on the left side. Vitals reviewed.  Patient has 3 out of 5 strength in her upper extremities. 2 out of  5 strength in her lower extremities. She has spastic contractures at her elbows and at her knees. She has hyperreflexia. She is confined to wheelchair and is unable to stand or walk even with maximum assistance.        Assessment & Plan:  High risk medication use  Schizoaffective disorder, unspecified type (HCC)  Spastic quadriplegia (HCC)  Wheelchair bound I believe it's reasonable to try to stop the Abilify and watch the patient closely. Should she develop any hallucinations, paranoia, delusions, mood swings etc. I would want to resume the medication immediately. She definitely requires side pads for positioning of her trunk in her motorized wheelchair. She also requires any seatbelt

## 2015-11-30 ENCOUNTER — Other Ambulatory Visit: Payer: Self-pay | Admitting: Family Medicine

## 2015-12-06 ENCOUNTER — Ambulatory Visit (INDEPENDENT_AMBULATORY_CARE_PROVIDER_SITE_OTHER): Payer: Medicare Other | Admitting: Family Medicine

## 2015-12-06 ENCOUNTER — Encounter: Payer: Self-pay | Admitting: Family Medicine

## 2015-12-06 VITALS — BP 110/68 | HR 84 | Temp 97.6°F | Resp 18

## 2015-12-06 DIAGNOSIS — F259 Schizoaffective disorder, unspecified: Secondary | ICD-10-CM
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
## 2015-12-15 ENCOUNTER — Telehealth: Payer: Self-pay | Admitting: Family Medicine

## 2015-12-15 NOTE — Telephone Encounter (Signed)
Ok to resume abilify 2 mg poqhs.

## 2015-12-15 NOTE — Telephone Encounter (Signed)
Father called in states he is going to start patient back on her abilify today. He states he noticed she was starting getting excited again. He also needs a note saying she is using this medication again.  CB# [phone]

## 2015-12-15 NOTE — Telephone Encounter (Signed)
Pt's father aware and states that he already has some of the 2mg  abilitfy and will start it tonight

## 2015-12-19 ENCOUNTER — Other Ambulatory Visit: Payer: Self-pay | Admitting: Family Medicine

## 2015-12-20 ENCOUNTER — Ambulatory Visit (INDEPENDENT_AMBULATORY_CARE_PROVIDER_SITE_OTHER): Payer: Medicare Other | Admitting: Family Medicine

## 2015-12-20 ENCOUNTER — Encounter: Payer: Self-pay | Admitting: Family Medicine

## 2015-12-20 VITALS — BP 108/70 | HR 88 | Temp 98.3°F | Resp 18

## 2015-12-20 DIAGNOSIS — F259 Schizoaffective disorder, unspecified: Secondary | ICD-10-CM

## 2015-12-20 NOTE — Telephone Encounter (Signed)
ok 
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
## 2015-12-20 NOTE — Telephone Encounter (Signed)
Ok to refill 

## 2016-01-16 ENCOUNTER — Other Ambulatory Visit: Payer: Self-pay | Admitting: Family Medicine

## 2016-01-16 NOTE — Telephone Encounter (Signed)
Refill appropriate and filled per protocol. 

## 2016-01-20 ENCOUNTER — Ambulatory Visit: Payer: Medicare Other | Admitting: Family Medicine

## 2016-02-02 ENCOUNTER — Encounter: Payer: Self-pay | Admitting: Family Medicine

## 2016-02-02 ENCOUNTER — Ambulatory Visit (INDEPENDENT_AMBULATORY_CARE_PROVIDER_SITE_OTHER): Payer: Medicare Other | Admitting: Family Medicine

## 2016-02-02 VITALS — BP 98/68 | HR 76 | Temp 98.6°F

## 2016-02-02 DIAGNOSIS — Z79899 Other long term (current) drug therapy: Secondary | ICD-10-CM

## 2016-02-02 DIAGNOSIS — F259 Schizoaffective disorder, unspecified: Secondary | ICD-10-CM | POA: Diagnosis not present

## 2016-02-02 NOTE — Progress Notes (Signed)
Subjective:    Patient ID: Nancy [name], female    DOB: 28-Jan-1991, 25 y.o.   MRN: [id number]  HPI  09/28/14 Patient is a 25 year old African-American female who is being seen today for a face-to-face evaluation for power wheelchair. 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 She needs a replacement for her current wheelchair as she has had her current wheelchair for over 5 years.   It is in need of replacement due to wear and tear from normal use. The battery cover has fallen off the back of the wheelchair. The toggle that operates a wheelchair is missing the knob. Several pieces are in disrepair on the wheelchair. Also her wheelchairs power seating is not working consistently. Furthermore the wheelchair no longer meets her positioning needs. She's been evaluated by physical therapist at [facility]. They determined that the patient has poor static and dynamic sitting balance, decreased rate, decreased range of motion and requires maximum assistance for all ADLs.   The patient is not able to functionally ambulate independently or with assistance due to her spastic quadriplegia. She does not have sufficient strength in her lower extremities to even bear weight on her legs. She also does not have sufficient strength in her upper extremities to operate a manual wheelchair. In fact due to spasticity,  She is unable to fully extend her arms at the elbows. Furthermore her muscle strength is at best 3 over 5 in the upper extremities against resistance. Therefore I do not believe that with the contractures of her hands in her upper extremities she would be able to operate a manual wheelchair. Patient is unable to walk at all. Therefore she relies on power mobility for all her functional mobility.   she also has schizoaffective disorder with bipolar tendencies with paranoia and psychosis. This is currently being well managed with Abilify.   There  is no evidence of depression or anxiety at the present time. She denies any hallucinations or delusions. There are no violent outburst. She is overdue for fasting lab work. Her flu shot is up-to-date.  At that time, my plan was:   patient requires a power wheelchair due to her spastic quadriparesis stemming from her cerebral palsy. Her current wheelchair is over 517 years old and is in disrepair from typical wear and tear. The wheelchair is no longer meeting her functional needs or positional needs. I will complete the necessary paperwork and fax this to her medical equipment company. I also have asked the patient to return fasting for a CMP as well as fasting lipid panel to monitor dyslipidemia stemming from her Abilify. Clinically the medicine is working fine.  11/16/14 She is here today for fasting lab work to monitor for complications stemming from the abilify.  Otherwise she is doing well.  At that time, my plan was: obtain CBC, CMP, fasting lipid panel, and hemoglobin A1c to evaluate for dyslipidemia, hyperlipidemia, and hyperglycemia on Abilify  05/27/15 Patient is here today for follow-up. She seems to be drooling more excessively than in the past. Throughout her entire encounter today, her father is constantly having to wipe the drool from her mouth. She is no longer living in a group home. She lives at home with her father. During the day she goes to school and is in a work program. She is self-conscious and she is very concerned about the drooling and being embarrassed by this. She also reports difficulty going to the bathroom. She is not  constipated. Instead, she reports having stool that is mushy and constantly leaks out.  Partly, this is due to diminished rectal tone. However she does not have formed bowel movements. She cannot wipe enough to get herself cleaned. She is not taking any kind of fiber supplement.  She also complains of vocal tics and stuttering ever since starting the Abilify.  Patient has been stable now for more than a year. She had schizoaffective disorder bipolar type with mood swings, hallucinations, and violent outburst. She is interested in stopping the medication.  At that time, my plan was: For the patient's drooling, I wouldn't increase glycopyrrolate to its highest dose, 1 mg by mouth 3 times a day. I would recommend Metamucil or some type of fiber  Supplement as a bulking agent to hopefully create more formed bowel movements. I will recommend decreasing Abilify from 5 mg a day to 2 mg a day. However I would not discontinue the medication because I am worried about relapse. Patient also received her flu shot today  09/06/15  She is here today for recheck. She is doing very well on Abilify 2 mg by mouth daily.   She denies any side effects from the medication. She denies any hallucinations. She denies any delusions. She denies any aggressive behavior. She denies any mood swings while outburst. She is accompanied by her father agrees. She does have occasional  Sad days. These typically last 1-2 days and then resolve spontaneously.  Frequently this S doing the patient becomes frustrated by her medical problems. However she denies any depression or suicidal ideation. She is interested in possibly stopping the medication in the future. She also has had one episode of trace blood in her stool. This occurred yesterday. It was just a little bit of blood when they wiped. There are no hemorrhoids or pain with defecation. She denies any diarrhea or constipation.  At that time my plan was:  schizoaffective disorder is well controlled. I will continue Abilify for the present time. Given the nature of the medication, I will check her liver function tests, her fasting blood sugar, profile, and her cholesterol. I believe the blood that they saw yesterday was just irritation due to wiping. If this persists, we could workup further but at the present time I see no indication for  anoscopy.   11/22/15 Patient is requesting parts for her motorized wheelchair. She needs a seatbelt as her current seatbelt is completely worn out and no longer sufficient. Also the positioning pads that position her trunk and her torso have broken off the wheelchair. This causes the patient to slow down in the wheelchair and sometimes even slide out of the wheelchair. Therefore she needs replacement on her seatbelt as well as her positioning pads. I will be glad to fill out any necessary paperwork for this. However she also wants to discontinue the Abilify. She's been on the Abilify now for a prolonged period of time. She denies any side effects on the medication. She denies any depression. She denies any hallucinations. She denies any paranoia or delusions. Her father is convinced that it has to do with the transition that was going on in the past that was causing her to act out. He does not believe that she has schizoaffective disorder. Neither the patient nor her father believe she requires the Abilify.  At that time, my plan was: .I believe it's reasonable to try to stop the Abilify and watch the patient closely. Should she develop any hallucinations, paranoia, delusions,  mood swings etc. I would want to resume the medication immediately. She definitely requires side pads for positioning of her trunk in her motorized wheelchair. She also requires any seatbelt.  12/06/15 She has done very well since stopping the abilify 2 weeks ago.  She denies any hallucinations. She denies any delusional behavior. She denies any increasing anxiety or worsening depression. She is sleeping well. She denies any manic symptoms. She denies any racing thoughts, intrusive thoughts.  At that time, my plan was: Patient seems to be doing very well having stopped the Abilify now for 2 weeks. I recommended that we monitor her very closely.  Should she demonstrate any delusional behavior, hallucinations, worsening anxiety, or manic  symptoms, I would recommend resuming the Abilify immediately. The patient and her family believe that her previous behavior was due to someone at her previous group home "giving her something."  She has been asymptomatic now for almost 2 years. She demonstrated no behavior like that prior and no behavior like that since. She is now at home living with her father who has health care aides come in during the day while he is at work.  02/03/16 Patient seems to be doing much better. However she does still report depression and anhedonia and anxiety and insomnia. However she is under tremendous stress. Mother has been diagnosed with stage IV cancer. Father was just taken the hospital with a possible stroke. Patient lives at home and is troubled by seeing her parents so sick. The thought of being left alone scares her intensely. Would really like to have a counselor she could talk to to help work through her stress. She denies any hallucinations or delusions or suicidal ideation Past Medical History  Diagnosis Date  . CP (cerebral palsy) (HCC)     with mild contracture  . MR (mental retardation)   . Mood disorder (HCC)   . Peripheral vascular disease (HCC) 10/02/2006    bilateral DVT lower legs  . Wheelchair bound     r/t CP  . Seizures (HCC)     childhood  . Menorrhagia with regular cycle 11/11/2012  . Mobility impaired 11/11/2012  . IUD (intrauterine device) in place 10/2012  . Anemia     h/o iron deficiency   Past Surgical History  Procedure Laterality Date  . Multiple tooth extractions  10/18/10  . Leg surgery      as a child  . Eye surgery      as a child  . Intrauterine device (iud) insertion N/A 11/12/2012    Procedure: INTRAUTERINE DEVICE (IUD) INSERTION;  Surgeon: Sherron Monday, MD;  Location: [facility];  Service: Gynecology;  Laterality: N/A;  . Hip surgery Right 2003   Current Outpatient Prescriptions on File Prior to Visit  Medication Sig Dispense Refill  . ARIPiprazole (ABILIFY) 2 MG  tablet TAKE ONE TABLET BY MOUTH ONCE DAILY 30 tablet 3  . Cetirizine HCl 10 MG CAPS Take one tablet daily prn runny nose and drainage. (Patient not taking: Reported on 12/06/2015) 30 capsule 0  . clonazePAM (KLONOPIN) 1 MG tablet TAKE ONE TABLET EVERY MORNING 30 tablet 2  . glycopyrrolate (ROBINUL) 1 MG tablet TAKE ONE TABLET THREE TIMES DAILY 90 tablet 3   No current facility-administered medications on file prior to visit.   No Known Allergies Social History   Social History  . Marital Status: Single    Spouse Name: N/A  . Number of Children: N/A  . Years of Education: N/A   Occupational History  .  Not on file.   Social History Main Topics  . Smoking status: Never Smoker   . Smokeless tobacco: Never Used  . Alcohol Use: No     Comment: Pt denies  . Drug Use: No     Comment: Pt denies  . Sexual Activity: No     Comment: lives in a group home.  single.   Other Topics Concern  . Not on file   Social History Narrative     Review of Systems  All other systems reviewed and are negative.      Objective:   Physical Exam  Constitutional: She is oriented to person, place, and time. She appears well-developed and well-nourished.  Cardiovascular: Normal rate, regular rhythm and normal heart sounds.  Exam reveals no gallop and no friction rub.   No murmur heard. Pulmonary/Chest: Effort normal and breath sounds normal. No respiratory distress. She has no wheezes. She has no rales.  Abdominal: Soft. Bowel sounds are normal.  Neurological: She is alert and oriented to person, place, and time. She exhibits abnormal muscle tone. Coordination abnormal.  Reflex Scores:      Tricep reflexes are 3+ on the right side and 3+ on the left side.      Bicep reflexes are 3+ on the right side and 3+ on the left side.      Brachioradialis reflexes are 3+ on the right side and 3+ on the left side. Vitals reviewed.      Assessment & Plan:  Schizoaffective disorder, unspecified type (HCC) -  Plan: Ambulatory referral to Psychology  High risk medication use  Continue Abilify at its present dose.  However I do believe the patient would benefit greatly from working with a psychologist/counselor/therapist for possible cognitive behavioral therapy to help address her depression and her fear in her anxiety. I will schedule this as an outpatient.

## 2016-03-16 ENCOUNTER — Emergency Department (HOSPITAL_COMMUNITY)
Admission: EM | Admit: 2016-03-16 | Discharge: 2016-03-16 | Disposition: A | Discharge status: Home / Self Care | Payer: Medicare Other | Attending: Emergency Medicine | Admitting: Emergency Medicine

## 2016-03-16 ENCOUNTER — Emergency Department (HOSPITAL_BASED_OUTPATIENT_CLINIC_OR_DEPARTMENT_OTHER): Admit: 2016-03-16 | Discharge: 2016-03-16 | Disposition: A | Discharge status: Home / Self Care | Payer: Medicare Other

## 2016-03-16 ENCOUNTER — Encounter (HOSPITAL_COMMUNITY): Payer: Self-pay | Admitting: *Deleted

## 2016-03-16 DIAGNOSIS — M7989 Other specified soft tissue disorders: Secondary | ICD-10-CM

## 2016-03-16 DIAGNOSIS — R600 Localized edema: Secondary | ICD-10-CM | POA: Diagnosis not present

## 2016-03-16 DIAGNOSIS — Z79899 Other long term (current) drug therapy: Secondary | ICD-10-CM | POA: Insufficient documentation

## 2016-03-16 MED ORDER — ACETAMINOPHEN 500 MG PO TABS
1000.0000 mg | ORAL_TABLET | Freq: Once | ORAL | Status: AC
  Administered 2016-03-16: 1000 mg via ORAL
  Filled 2016-03-16: qty 2

## 2016-03-16 NOTE — ED Notes (Signed)
Pt and family member reports pt had her foot in shoes that were too small yesterday. Then had swelling to bilateral legs but more severe in left leg. Pt has hx of CP and MR but had reported pain to bilateral legs. Hx of blood clots.

## 2016-03-16 NOTE — ED Provider Notes (Signed)
CSN: [id number]     Arrival date & time 03/16/16  1421 History   First MD Initiated Contact with Patient 03/16/16 1649     Chief Complaint  Patient presents with  . Leg Swelling     (Consider location/radiation/quality/duration/timing/severity/associated sxs/prior Treatment) HPI Comments: 25 year old female with  congenital quadriplegia, scoliosis, mild clonus, intellectual disabilities presents with mild leg swelling bilateral since yesterday. No injuries. Patient did have leg swelling and blood clot when she was on contraceptives in the past. Patient currently on Mirena. Patient no shortness breath or chest pain. Patient feels well otherwise. Patient eating drinking urinating fine. No kidney disease history   The history is provided by the patient.    Past Medical History  Diagnosis Date  . CP (cerebral palsy) (HCC)     with mild contracture  . MR (mental retardation)   . Mood disorder (HCC)   . Peripheral vascular disease (HCC) 10/02/2006    bilateral DVT lower legs  . Wheelchair bound     r/t CP  . Seizures (HCC)     childhood  . Menorrhagia with regular cycle 11/11/2012  . Mobility impaired 11/11/2012  . IUD (intrauterine device) in place 10/2012  . Anemia     h/o iron deficiency   Past Surgical History  Procedure Laterality Date  . Multiple tooth extractions  10/18/10  . Leg surgery      as a child  . Eye surgery      as a child  . Intrauterine device (iud) insertion N/A 11/12/2012    Procedure: INTRAUTERINE DEVICE (IUD) INSERTION;  Surgeon: Sherron MondayJody Bovard, MD;  Location: [facility];  Service: Gynecology;  Laterality: N/A;  . Hip surgery Right 2003   Family History  Problem Relation Age of Onset  . Cancer Mother   . Diabetes Father   . Alzheimer's disease Maternal Grandfather   . Heart attack Paternal Grandmother     Died between 2660 or 25 years old   Social History  Substance Use Topics  . Smoking status: Never Smoker   . Smokeless tobacco: Never Used  . Alcohol Use:  No     Comment: Pt denies   OB History    No data available     Review of Systems  Constitutional: Negative for fever and chills.  HENT: Negative for congestion.   Eyes: Negative for visual disturbance.  Respiratory: Negative for shortness of breath.   Cardiovascular: Positive for leg swelling. Negative for chest pain.  Gastrointestinal: Negative for vomiting and abdominal pain.  Genitourinary: Negative for dysuria and flank pain.  Musculoskeletal: Negative for back pain, neck pain and neck stiffness.  Skin: Negative for rash.  Neurological: Negative for light-headedness and headaches.      Allergies  Review of patient's allergies indicates no known allergies.  Home Medications   Prior to Admission medications   Medication Sig Start Date End Date Taking? Authorizing Provider  ARIPiprazole (ABILIFY) 2 MG tablet TAKE ONE TABLET BY MOUTH ONCE DAILY 01/16/16  Yes Donita BrooksWarren T Pickard, MD  clonazePAM (KLONOPIN) 1 MG tablet TAKE ONE TABLET EVERY MORNING 12/21/15  Yes Donita BrooksWarren T Pickard, MD  glycopyrrolate (ROBINUL) 1 MG tablet TAKE ONE TABLET THREE TIMES DAILY 11/18/15  Yes Donita BrooksWarren T Pickard, MD  levonorgestrel (MIRENA) 20 MCG/24HR IUD 1 each by Intrauterine route once.   Yes Historical Provider, MD   BP 105/66 mmHg  Pulse 92  Temp(Src) 97.7 F (36.5 C) (Oral)  Resp 16  SpO2 99% Physical Exam  Constitutional: She is  oriented to person, place, and time. She appears well-developed and well-nourished.  HENT:  Head: Normocephalic and atraumatic.  Eyes: Right eye exhibits no discharge. Left eye exhibits no discharge.  Neck: Normal range of motion. Neck supple. No tracheal deviation present.  Cardiovascular: Regular rhythm.   Pulmonary/Chest: Effort normal and breath sounds normal.  Abdominal: Soft. She exhibits no distension. There is no tenderness. There is no guarding.  Musculoskeletal: She exhibits edema. She exhibits no tenderness.  tenderness. Patient has general weakness on exam  chronic findings.  Neurological: She is alert and oriented to person, place, and time.  Skin: Skin is warm. No rash noted.  Psychiatric:  Mild intellectual slowness  Nursing note and vitals reviewed.   ED Course  Procedures (including critical care time) Labs Review Labs Reviewed - No data to display  Imaging Review No results found. I have personally reviewed and evaluated these images and lab results as part of my medical decision-making.   EKG Interpretation None      MDM   Final diagnoses:  Bilateral leg edema   Patient resents with mild leg edema. Well appearing at baseline otherwise. Ultrasounds performed radiology verbally reported no blood clot.  Results and differential diagnosis were discussed with the patient/parent/guardian. Xrays were independently reviewed by myself.  Close follow up outpatient was discussed, comfortable with the plan.   Medications  acetaminophen (TYLENOL) tablet 1,000 mg (1,000 mg Oral Given 03/16/16 1742)    Filed Vitals:   03/16/16 1700 03/16/16 1705 03/16/16 1800 03/16/16 1845  BP: 104/86 104/86 107/67 105/66  Pulse: 101 113 103 92  Temp:      TempSrc:      Resp:  SpO2: 99% 98% 97% 99%    Final diagnoses:  Bilateral leg edema       Blane Ohara, MD 03/16/16 1949

## 2016-03-16 NOTE — Progress Notes (Signed)
Preliminary results by tech- Venous Duplex Lower Ext. Completed. Technically difficult study due to patient's contracted state. There is no evidence of obvious  acute deep vein thrombosis in both legs. Left calf was technically difficult to evaluate in its entirety. Marilynne Halstedita Damean Poffenberger, BS, RDMS, RVT

## 2016-03-16 NOTE — ED Notes (Signed)
Wheeled pt back to room from waiting room. 

## 2016-03-16 NOTE — Discharge Instructions (Signed)
If you were given medicines take as directed.  If you are on coumadin or contraceptives realize their levels and effectiveness is altered by many different medicines.  If you have any reaction (rash, tongues swelling, other) to the medicines stop taking and see a physician.    If your blood pressure was elevated in the ER make sure you follow up for management with a primary doctor or return for chest pain, shortness of breath or stroke symptoms.  Please follow up as directed and return to the ER or see a physician for new or worsening symptoms.  Thank you. Filed Vitals:   03/16/16 1700 03/16/16 1705 03/16/16 1800 03/16/16 1845  BP: 104/86 104/86 107/67 105/66  Pulse: 101 113 103 92  Temp:      TempSrc:      Resp:  16 16 16   SpO2: 99% 98% 97% 99%

## 2016-03-16 NOTE — ED Notes (Signed)
US at bedside

## 2016-03-21 ENCOUNTER — Other Ambulatory Visit: Payer: Self-pay | Admitting: Family Medicine

## 2016-03-22 ENCOUNTER — Telehealth: Payer: Self-pay | Admitting: Family Medicine

## 2016-03-22 ENCOUNTER — Ambulatory Visit: Payer: Medicare Other | Admitting: Family Medicine

## 2016-03-22 NOTE — Telephone Encounter (Signed)
Requesting a refill on Klonopin 1mg  tablet 1 tab po qam - Ok to refill??

## 2016-03-22 NOTE — Telephone Encounter (Signed)
Refill appropriate and filled per protocol. 

## 2016-03-23 MED ORDER — CLONAZEPAM 1 MG PO TABS: 1.0000 mg | ORAL_TABLET | Freq: Every morning | ORAL | 2 refills | Status: DC

## 2016-03-23 NOTE — Telephone Encounter (Signed)
Medication called/sent to requested pharmacy  

## 2016-03-23 NOTE — Telephone Encounter (Signed)
ok 

## 2016-04-05 ENCOUNTER — Encounter: Payer: Self-pay | Admitting: Family Medicine

## 2016-04-05 ENCOUNTER — Ambulatory Visit (INDEPENDENT_AMBULATORY_CARE_PROVIDER_SITE_OTHER): Payer: Medicare Other | Admitting: Family Medicine

## 2016-04-05 VITALS — BP 136/72 | HR 98 | Temp 98.1°F | Resp 16

## 2016-04-05 DIAGNOSIS — F4321 Adjustment disorder with depressed mood: Secondary | ICD-10-CM

## 2016-04-05 DIAGNOSIS — F259 Schizoaffective disorder, unspecified: Secondary | ICD-10-CM

## 2016-04-05 MED ORDER — ALPRAZOLAM 0.5 MG PO TABS: 0.5000 mg | ORAL_TABLET | Freq: Three times a day (TID) | ORAL | 0 refills | Status: DC | PRN

## 2016-04-05 NOTE — Progress Notes (Signed)
Subjective:    Patient ID: Nancy [name], female    DOB: Aug 25, 1991, 25 y.o.   MRN: [id number]  HPI 09/28/14 Patient is a 25 year old African-American female who is being seen today for a face-to-face evaluation for power wheelchair. 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 She needs a replacement for her current wheelchair as she has had her current wheelchair for over 5 years.   It is in need of replacement due to wear and tear from normal use. The battery cover has fallen off the back of the wheelchair. The toggle that operates a wheelchair is missing the knob. Several pieces are in disrepair on the wheelchair. Also her wheelchairs power seating is not working consistently. Furthermore the wheelchair no longer meets her positioning needs. She's been evaluated by physical therapist at [facility]. They determined that the patient has poor static and dynamic sitting balance, decreased rate, decreased range of motion and requires maximum assistance for all ADLs.   The patient is not able to functionally ambulate independently or with assistance due to her spastic quadriplegia. She does not have sufficient strength in her lower extremities to even bear weight on her legs. She also does not have sufficient strength in her upper extremities to operate a manual wheelchair. In fact due to spasticity,  She is unable to fully extend her arms at the elbows. Furthermore her muscle strength is at best 3 over 5 in the upper extremities against resistance. Therefore I do not believe that with the contractures of her hands in her upper extremities she would be able to operate a manual wheelchair. Patient is unable to walk at all. Therefore she relies on power mobility for all her functional mobility.   she also has schizoaffective disorder with bipolar tendencies with paranoia and psychosis. This is currently being well managed with Abilify.   There  is no evidence of depression or anxiety at the present time. She denies any hallucinations or delusions. There are no violent outburst. She is overdue for fasting lab work. Her flu shot is up-to-date.  At that time, my plan was:   patient requires a power wheelchair due to her spastic quadriparesis stemming from her cerebral palsy. Her current wheelchair is over 25 years old and is in disrepair from typical wear and tear. The wheelchair is no longer meeting her functional needs or positional needs. I will complete the necessary paperwork and fax this to her medical equipment company. I also have asked the patient to return fasting for a CMP as well as fasting lipid panel to monitor dyslipidemia stemming from her Abilify. Clinically the medicine is working fine.  11/16/14 She is here today for fasting lab work to monitor for complications stemming from the abilify.  Otherwise she is doing well.  At that time, my plan was: obtain CBC, CMP, fasting lipid panel, and hemoglobin A1c to evaluate for dyslipidemia, hyperlipidemia, and hyperglycemia on Abilify  05/27/15 Patient is here today for follow-up. She seems to be drooling more excessively than in the past. Throughout her entire encounter today, her father is constantly having to wipe the drool from her mouth. She is no longer living in a group home. She lives at home with her father. During the day she goes to school and is in a work program. She is self-conscious and she is very concerned about the drooling and being embarrassed by this. She also reports difficulty going to the bathroom. She is not constipated.  Instead, she reports having stool that is mushy and constantly leaks out.  Partly, this is due to diminished rectal tone. However she does not have formed bowel movements. She cannot wipe enough to get herself cleaned. She is not taking any kind of fiber supplement.  She also complains of vocal tics and stuttering ever since starting the Abilify.  Patient has been stable now for more than a year. She had schizoaffective disorder bipolar type with mood swings, hallucinations, and violent outburst. She is interested in stopping the medication.  At that time, my plan was: For the patient's drooling, I wouldn't increase glycopyrrolate to its highest dose, 1 mg by mouth 3 times a day. I would recommend Metamucil or some type of fiber  Supplement as a bulking agent to hopefully create more formed bowel movements. I will recommend decreasing Abilify from 5 mg a day to 2 mg a day. However I would not discontinue the medication because I am worried about relapse. Patient also received her flu shot today  09/06/15  She is here today for recheck. She is doing very well on Abilify 2 mg by mouth daily.   She denies any side effects from the medication. She denies any hallucinations. She denies any delusions. She denies any aggressive behavior. She denies any mood swings while outburst. She is accompanied by her father agrees. She does have occasional  Sad days. These typically last 1-2 days and then resolve spontaneously.  Frequently this S doing the patient becomes frustrated by her medical problems. However she denies any depression or suicidal ideation. She is interested in possibly stopping the medication in the future. She also has had one episode of trace blood in her stool. This occurred yesterday. It was just a little bit of blood when they wiped. There are no hemorrhoids or pain with defecation. She denies any diarrhea or constipation.  At that time my plan was:  schizoaffective disorder is well controlled. I will continue Abilify for the present time. Given the nature of the medication, I will check her liver function tests, her fasting blood sugar, profile, and her cholesterol. I believe the blood that they saw yesterday was just irritation due to wiping. If this persists, we could workup further but at the present time I see no indication for  anoscopy.   11/22/15 Patient is requesting parts for her motorized wheelchair. She needs a seatbelt as her current seatbelt is completely worn out and no longer sufficient. Also the positioning pads that position her trunk and her torso have broken off the wheelchair. This causes the patient to slow down in the wheelchair and sometimes even slide out of the wheelchair. Therefore she needs replacement on her seatbelt as well as her positioning pads. I will be glad to fill out any necessary paperwork for this. However she also wants to discontinue the Abilify. She's been on the Abilify now for a prolonged period of time. She denies any side effects on the medication. She denies any depression. She denies any hallucinations. She denies any paranoia or delusions. Her father is convinced that it has to do with the transition that was going on in the past that was causing her to act out. He does not believe that she has schizoaffective disorder. Neither the patient nor her father believe she requires the Abilify.  At that time, my plan was: .I believe it's reasonable to try to stop the Abilify and watch the patient closely. Should she develop any hallucinations, paranoia, delusions, mood  swings etc. I would want to resume the medication immediately. She definitely requires side pads for positioning of her trunk in her motorized wheelchair. She also requires any seatbelt.  12/06/15 She has done very well since stopping the abilify 2 weeks ago.  She denies any hallucinations. She denies any delusional behavior. She denies any increasing anxiety or worsening depression. She is sleeping well. She denies any manic symptoms. She denies any racing thoughts, intrusive thoughts.  At that time, my plan was: Patient seems to be doing very well having stopped the Abilify now for 2 weeks. I recommended that we monitor her very closely.  Should she demonstrate any delusional behavior, hallucinations, worsening anxiety, or manic  symptoms, I would recommend resuming the Abilify immediately. The patient and her family believe that her previous behavior was due to someone at her previous group home "giving her something."  She has been asymptomatic now for almost 2 years. She demonstrated no behavior like that prior and no behavior like that since. She is now at home living with her father who has health care aides come in during the day while he is at work.  02/03/16 Patient seems to be doing much better. However she does still report depression and anhedonia and anxiety and insomnia. However she is under tremendous stress. Mother has been diagnosed with stage IV cancer. Father was just taken the hospital with a possible stroke. Patient lives at home and is troubled by seeing her parents so sick. The thought of being left alone scares her intensely. Would really like to have a counselor she could talk to to help work through her stress. She denies any hallucinations or delusions or suicidal ideation.  At that time, my plan was: Continue Abilify at its present dose.  However I do believe the patient would benefit greatly from working with a psychologist/counselor/therapist for possible cognitive behavioral therapy to help address her depression and her fear in her anxiety. I will schedule this as an outpatient.  04/05/16 2 weeks ago, the patient's mother died due to her stage IV cancer. Since that time the patient feels extremely sad. She reports that she is having a difficult time controlling her emotions.. She is interested in increasing her Abilify. I spent 15 minutes discussing the situation with [name]. His been only 2 weeks since her mother died. This is a very emotional time and she is always a grieving. She denies any suicidal ideation. She denies any hallucinations or delusions. She denies any manic symptoms. Certainly she is sad and very emotional but I think this is more of a grief reaction rather than due to her  schizoaffective disorder. Past Medical History:  Diagnosis Date  . Anemia    h/o iron deficiency  . CP (cerebral palsy) (HCC)    with mild contracture  . IUD (intrauterine device) in place 10/2012  . Menorrhagia with regular cycle 11/11/2012  . Mobility impaired 11/11/2012  . Mood disorder (HCC)   . MR (mental retardation)   . Peripheral vascular disease (HCC) 10/02/2006   bilateral DVT lower legs  . Seizures (HCC)    childhood  . Wheelchair bound    r/t CP   Past Surgical History:  Procedure Laterality Date  . EYE SURGERY     as a child  . HIP SURGERY Right 2003  . INTRAUTERINE DEVICE (IUD) INSERTION N/A 11/12/2012   Procedure: INTRAUTERINE DEVICE (IUD) INSERTION;  Surgeon: Sherron Monday, MD;  Location: [facility];  Service: Gynecology;  Laterality: N/A;  .  LEG SURGERY     as a child  . MULTIPLE TOOTH EXTRACTIONS  10/18/10   Current Outpatient Prescriptions on File Prior to Visit  Medication Sig Dispense Refill  . ARIPiprazole (ABILIFY) 2 MG tablet TAKE ONE TABLET BY MOUTH ONCE DAILY 30 tablet 3  . clonazePAM (KLONOPIN) 1 MG tablet Take 1 tablet (1 mg total) by mouth every morning. 30 tablet 2  . glycopyrrolate (ROBINUL) 1 MG tablet TAKE ONE TABLET THREE TIMES DAILY 90 tablet 6  . levonorgestrel (MIRENA) 20 MCG/24HR IUD 1 each by Intrauterine route once.     No current facility-administered medications on file prior to visit.    No Known Allergies Social History   Social History  . Marital status: Single    Spouse name: N/A  . Number of children: N/A  . Years of education: N/A   Occupational History  . Not on file.   Social History Main Topics  . Smoking status: Never Smoker  . Smokeless tobacco: Never Used  . Alcohol use No     Comment: Pt denies  . Drug use: No     Comment: Pt denies  . Sexual activity: No     Comment: lives in a group home.  single.   Other Topics Concern  . Not on file   Social History Narrative  . No narrative on file     Review of  Systems  All other systems reviewed and are negative.      Objective:   Physical Exam  Constitutional: She is oriented to person, place, and time. She appears well-developed and well-nourished.  Cardiovascular: Normal rate, regular rhythm and normal heart sounds.  Exam reveals no gallop and no friction rub.   No murmur heard. Pulmonary/Chest: Effort normal and breath sounds normal. No respiratory distress. She has no wheezes. She has no rales.  Abdominal: Soft. Bowel sounds are normal.  Neurological: She is alert and oriented to person, place, and time. She exhibits abnormal muscle tone. Coordination abnormal.  Reflex Scores:      Tricep reflexes are 3+ on the right side and 3+ on the left side.      Bicep reflexes are 3+ on the right side and 3+ on the left side.      Brachioradialis reflexes are 3+ on the right side and 3+ on the left side. Vitals reviewed.      Assessment & Plan:  Grief reaction  Schizoaffective disorder, unspecified type (HCC) Continue Abilify at its present dose. Temporarily discontinue Klonopin. Temporarily replaced with Xanax 0.5 mg 1 tablet by mouth every 8 hours when necessary anxiety. I recommended the patient allowed for 6 weeks to try to recover. Reassess at that time. Hopefully she will be able to resume her previous dose of Klonopin at that point we will discontinue Xanax.

## 2016-04-06 ENCOUNTER — Ambulatory Visit: Payer: Medicare Other | Admitting: Family Medicine

## 2016-04-16 ENCOUNTER — Telehealth: Payer: Self-pay | Admitting: *Deleted

## 2016-04-16 NOTE — Telephone Encounter (Signed)
Ok, please resume klonopin at previous dose.

## 2016-04-16 NOTE — Telephone Encounter (Signed)
Received call from patient father.   Reports that patient states that Klonopin is more effective than Xanax and is requesting to continue Klonopin.   States that he has not been giving her the Xanax, but instead has resumed Klonopin and wanted to make MD aware.

## 2016-04-18 NOTE — Telephone Encounter (Signed)
Call placed to patient and patient father made aware.    

## 2016-05-07 ENCOUNTER — Ambulatory Visit: Payer: Medicare Other | Admitting: Family Medicine

## 2016-05-17 ENCOUNTER — Other Ambulatory Visit: Payer: Self-pay | Admitting: Family Medicine

## 2016-06-08 ENCOUNTER — Ambulatory Visit: Payer: Medicare Other | Admitting: Family Medicine

## 2016-06-13 ENCOUNTER — Telehealth: Payer: Self-pay | Admitting: Family Medicine

## 2016-06-13 NOTE — Telephone Encounter (Signed)
Requesting a refill on Klonopin - Ok to refill??       

## 2016-06-14 MED ORDER — CLONAZEPAM 1 MG PO TABS: 1.0000 mg | ORAL_TABLET | Freq: Every morning | ORAL | 2 refills | Status: DC

## 2016-06-14 NOTE — Telephone Encounter (Signed)
ok 

## 2016-06-29 ENCOUNTER — Ambulatory Visit: Payer: Medicare Other | Admitting: Family Medicine

## 2016-07-06 ENCOUNTER — Ambulatory Visit (INDEPENDENT_AMBULATORY_CARE_PROVIDER_SITE_OTHER): Payer: Medicare Other | Admitting: Family Medicine

## 2016-07-06 VITALS — BP 118/64 | HR 84 | Temp 99.1°F

## 2016-07-06 DIAGNOSIS — Z23 Encounter for immunization: Secondary | ICD-10-CM | POA: Diagnosis not present

## 2016-07-06 DIAGNOSIS — F259 Schizoaffective disorder, unspecified: Secondary | ICD-10-CM
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
## 2016-07-09 NOTE — Addendum Note (Signed)
Addended by: Legrand RamsWILLIS, Mccrae Speciale B on: 07/09/2016 08:41 AM   Modules accepted: Orders

## 2016-07-31 ENCOUNTER — Encounter: Payer: Self-pay | Admitting: Family Medicine

## 2016-07-31 ENCOUNTER — Ambulatory Visit (INDEPENDENT_AMBULATORY_CARE_PROVIDER_SITE_OTHER): Payer: Medicare Other | Admitting: Family Medicine

## 2016-07-31 VITALS — BP 118/78 | HR 82 | Temp 98.9°F | Resp 18

## 2016-07-31 DIAGNOSIS — N390 Urinary tract infection, site not specified: Secondary | ICD-10-CM

## 2016-07-31 DIAGNOSIS — N760 Acute vaginitis: Secondary | ICD-10-CM | POA: Diagnosis not present

## 2016-07-31 LAB — WET PREP FOR TRICH, YEAST, CLUE
Trich, Wet Prep: NONE SEEN
WBC WET PREP: NONE SEEN
YEAST WET PREP: NONE SEEN

## 2016-07-31 MED ORDER — CLOTRIMAZOLE 1 % EX CREA: 1.0000 "application " | TOPICAL_CREAM | Freq: Two times a day (BID) | CUTANEOUS | 1 refills | Status: DC

## 2016-07-31 MED ORDER — CEPHALEXIN 500 MG PO CAPS: 500.0000 mg | ORAL_CAPSULE | Freq: Three times a day (TID) | ORAL | 0 refills | Status: DC

## 2016-07-31 NOTE — Patient Instructions (Signed)
Use the cream  Take the antibiotics as prescribed F/U as needed
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
## 2016-08-01 ENCOUNTER — Ambulatory Visit: Payer: Medicare Other | Admitting: Physician Assistant

## 2016-08-02 ENCOUNTER — Ambulatory Visit: Payer: Medicare Other | Admitting: Family Medicine

## 2016-08-06 ENCOUNTER — Ambulatory Visit: Payer: Medicare Other | Admitting: Physician Assistant

## 2016-08-06 ENCOUNTER — Telehealth: Payer: Self-pay | Admitting: Family Medicine

## 2016-08-06 NOTE — Telephone Encounter (Signed)
Call placed to patient.   Spoke with patient father.   States that patient had x1 dose on 12/5, TID dosing on 12/6/-12/10 and x1 dose on 08/06/2016.  Advised that UTI Sx have resolved.  Advised to stop taking ABTx at this time since patient has completed 5 full days.   Advised if S/Sx worsen or continue >1 week to contact office for OV.

## 2016-08-06 NOTE — Telephone Encounter (Signed)
Patient called in states she thinks the medication you prescribed last visit is too strong. She sttaes it makes her feel weak.  CB# [phone]

## 2016-08-06 NOTE — Telephone Encounter (Signed)
Call placed to patient to inquire.   Patient reports that she is feeling very weak and nauseated. Denies any other Sx.   MD please advise.

## 2016-08-06 NOTE — Telephone Encounter (Signed)
Patient is calling to say that the medication that was prescribed last week for her is making her weak, I could barely understand her  [phone]

## 2016-08-06 NOTE — Telephone Encounter (Signed)
See how many days she has taken or how many pills,   She can decresae to 500mg  twice a day for 5 days, then stop   Also the number she gave me for Crystal did not Work

## 2016-08-08 ENCOUNTER — Inpatient Hospital Stay (HOSPITAL_COMMUNITY)
Admission: EM | Admit: 2016-08-08 | Discharge: 2016-08-14 | DRG: 392 | Disposition: A | Discharge status: Home Health | Payer: Medicare Other | Attending: Family Medicine | Admitting: Family Medicine

## 2016-08-08 ENCOUNTER — Encounter (HOSPITAL_COMMUNITY): Payer: Self-pay | Admitting: Emergency Medicine

## 2016-08-08 DIAGNOSIS — E876 Hypokalemia: Secondary | ICD-10-CM | POA: Diagnosis present

## 2016-08-08 DIAGNOSIS — F79 Unspecified intellectual disabilities: Secondary | ICD-10-CM | POA: Diagnosis present

## 2016-08-08 DIAGNOSIS — Z8249 Family history of ischemic heart disease and other diseases of the circulatory system: Secondary | ICD-10-CM

## 2016-08-08 DIAGNOSIS — N200 Calculus of kidney: Secondary | ICD-10-CM | POA: Diagnosis not present

## 2016-08-08 DIAGNOSIS — K56609 Unspecified intestinal obstruction, unspecified as to partial versus complete obstruction: Secondary | ICD-10-CM | POA: Diagnosis not present

## 2016-08-08 DIAGNOSIS — R140 Abdominal distension (gaseous): Secondary | ICD-10-CM

## 2016-08-08 DIAGNOSIS — K5900 Constipation, unspecified: Secondary | ICD-10-CM

## 2016-08-08 DIAGNOSIS — R197 Diarrhea, unspecified: Secondary | ICD-10-CM

## 2016-08-08 DIAGNOSIS — A0811 Acute gastroenteropathy due to Norwalk agent: Secondary | ICD-10-CM | POA: Diagnosis not present

## 2016-08-08 DIAGNOSIS — F22 Delusional disorders: Secondary | ICD-10-CM | POA: Diagnosis present

## 2016-08-08 DIAGNOSIS — Z975 Presence of (intrauterine) contraceptive device: Secondary | ICD-10-CM

## 2016-08-08 DIAGNOSIS — Z993 Dependence on wheelchair: Secondary | ICD-10-CM

## 2016-08-08 DIAGNOSIS — D72829 Elevated white blood cell count, unspecified: Secondary | ICD-10-CM

## 2016-08-08 DIAGNOSIS — G808 Other cerebral palsy: Secondary | ICD-10-CM | POA: Diagnosis present

## 2016-08-08 DIAGNOSIS — Z86718 Personal history of other venous thrombosis and embolism: Secondary | ICD-10-CM

## 2016-08-08 DIAGNOSIS — R112 Nausea with vomiting, unspecified: Secondary | ICD-10-CM | POA: Diagnosis present

## 2016-08-08 DIAGNOSIS — R6510 Systemic inflammatory response syndrome (SIRS) of non-infectious origin without acute organ dysfunction: Secondary | ICD-10-CM

## 2016-08-08 DIAGNOSIS — I739 Peripheral vascular disease, unspecified: Secondary | ICD-10-CM | POA: Diagnosis present

## 2016-08-08 DIAGNOSIS — R569 Unspecified convulsions: Secondary | ICD-10-CM | POA: Diagnosis not present

## 2016-08-08 DIAGNOSIS — K2970 Gastritis, unspecified, without bleeding: Secondary | ICD-10-CM | POA: Diagnosis not present

## 2016-08-08 DIAGNOSIS — Z79899 Other long term (current) drug therapy: Secondary | ICD-10-CM

## 2016-08-08 DIAGNOSIS — N920 Excessive and frequent menstruation with regular cycle: Secondary | ICD-10-CM | POA: Diagnosis present

## 2016-08-08 DIAGNOSIS — R000 Tachycardia, unspecified: Secondary | ICD-10-CM

## 2016-08-08 DIAGNOSIS — G800 Spastic quadriplegic cerebral palsy: Secondary | ICD-10-CM | POA: Diagnosis present

## 2016-08-08 DIAGNOSIS — K5641 Fecal impaction: Secondary | ICD-10-CM | POA: Diagnosis present

## 2016-08-08 DIAGNOSIS — F419 Anxiety disorder, unspecified: Secondary | ICD-10-CM | POA: Diagnosis present

## 2016-08-08 LAB — COMPREHENSIVE METABOLIC PANEL
ALK PHOS: 27 U/L — AB (ref 38–126)
ALT: 29 U/L (ref 14–54)
AST: 24 U/L (ref 15–41)
Albumin: 2.8 g/dL — ABNORMAL LOW (ref 3.5–5.0)
Anion gap: 6 (ref 5–15)
BUN: 11 mg/dL (ref 6–20)
CALCIUM: 6.8 mg/dL — AB (ref 8.9–10.3)
CHLORIDE: 114 mmol/L — AB (ref 101–111)
CO2: 20 mmol/L — AB (ref 22–32)
Glucose, Bld: 112 mg/dL — ABNORMAL HIGH (ref 65–99)
Potassium: 3.5 mmol/L (ref 3.5–5.1)
SODIUM: 140 mmol/L (ref 135–145)
Total Bilirubin: 0.9 mg/dL (ref 0.3–1.2)
Total Protein: 5.2 g/dL — ABNORMAL LOW (ref 6.5–8.1)

## 2016-08-08 LAB — URINALYSIS, ROUTINE W REFLEX MICROSCOPIC
GLUCOSE, UA: NEGATIVE mg/dL
HGB URINE DIPSTICK: NEGATIVE
Ketones, ur: 20 mg/dL — AB
LEUKOCYTES UA: NEGATIVE
Nitrite: NEGATIVE
PROTEIN: NEGATIVE mg/dL
SPECIFIC GRAVITY, URINE: 1.029 (ref 1.005–1.030)
pH: 5 (ref 5.0–8.0)

## 2016-08-08 LAB — CBC
HCT: 49 % — ABNORMAL HIGH (ref 36.0–46.0)
HEMOGLOBIN: 16.8 g/dL — AB (ref 12.0–15.0)
MCH: 32.4 pg (ref 26.0–34.0)
MCHC: 34.3 g/dL (ref 30.0–36.0)
MCV: 94.4 fL (ref 78.0–100.0)
PLATELETS: 386 10*3/uL (ref 150–400)
RBC: 5.19 MIL/uL — AB (ref 3.87–5.11)
RDW: 13.1 % (ref 11.5–15.5)
WBC: 23.9 10*3/uL — AB (ref 4.0–10.5)

## 2016-08-08 LAB — LIPASE, BLOOD: Lipase: 29 U/L (ref 11–51)

## 2016-08-08 MED ORDER — SODIUM CHLORIDE 0.9 % IJ SOLN
INTRAMUSCULAR | Status: AC
  Administered 2016-08-09: 1 mL
  Filled 2016-08-08: qty 50

## 2016-08-08 MED ORDER — KETOROLAC TROMETHAMINE 30 MG/ML IJ SOLN
30.0000 mg | Freq: Once | INTRAMUSCULAR | Status: AC
  Administered 2016-08-08: 30 mg via INTRAVENOUS
  Filled 2016-08-08: qty 1

## 2016-08-08 MED ORDER — SODIUM CHLORIDE 0.9 % IV BOLUS (SEPSIS)
1000.0000 mL | Freq: Once | INTRAVENOUS | Status: AC
  Administered 2016-08-08: 1000 mL via INTRAVENOUS

## 2016-08-08 MED ORDER — MORPHINE SULFATE (PF) 4 MG/ML IV SOLN
4.0000 mg | Freq: Once | INTRAVENOUS | Status: AC
  Administered 2016-08-08: 4 mg via INTRAVENOUS
  Filled 2016-08-08: qty 1

## 2016-08-08 MED ORDER — IOPAMIDOL (ISOVUE-300) INJECTION 61%
INTRAVENOUS | Status: AC
  Filled 2016-08-08: qty 100

## 2016-08-08 MED ORDER — ONDANSETRON HCL 4 MG/2ML IJ SOLN
4.0000 mg | Freq: Once | INTRAMUSCULAR | Status: AC
  Administered 2016-08-08: 4 mg via INTRAVENOUS
  Filled 2016-08-08: qty 2

## 2016-08-08 NOTE — Discharge Instructions (Signed)
Read the information below.  Use the prescribed medication as directed.  Please discuss all new medications with your pharmacist.  You may return to the Emergency Department at any time for worsening condition or any new symptoms that concern you.   If you develop high fevers, worsening abdominal pain, uncontrolled vomiting, or are unable to tolerate fluids by mouth, return to the ER for a recheck.  ° °

## 2016-08-08 NOTE — ED Triage Notes (Signed)
Pt is from home.  Hx of CP and scoliosis.  Began N/V/D this am.  Pt is A&Ox4, but does speak slowly.  130/96BP 130P 98RA [id number] 182

## 2016-08-08 NOTE — ED Notes (Signed)
[facility] this RN with [place], NA bedside.  Unsuccessful.

## 2016-08-08 NOTE — ED Provider Notes (Signed)
[facility]-EMERGENCY DEPT Provider Note   CSN: [id number] Arrival date & time: 08/08/16  1740     History   Chief Complaint Chief Complaint  Patient presents with  . Nausea  . Emesis  . Diarrhea  . Abdominal Pain    HPI Nancy [name] is a 25 y.o. female.  HPI  Pt with hx cerebral palsy, seizures, mental retardation, wheelchair bound, presents with abdominal pain, N/V/D that began today.   Abdominal pain since this morning.  Diarrhea and vomiting since this afternoon.  Diarrhea is frequent, nonbloody.  Vomiting x 3, nonbloody.  Abdominal pain is diffuse, stabbing pain.  Hasn't urinated.  No fevers.  Pt denies hx abdominal surgeries.    Past Medical History:  Diagnosis Date  . Anemia    h/o iron deficiency  . CP (cerebral palsy) (HCC)    with mild contracture  . IUD (intrauterine device) in place 10/2012  . Menorrhagia with regular cycle 11/11/2012  . Mobility impaired 11/11/2012  . Mood disorder (HCC)   . MR (mental retardation)   . Peripheral vascular disease (HCC) 10/02/2006   bilateral DVT lower legs  . Seizures (HCC)    childhood  . Wheelchair bound    r/t CP    Patient Active Problem List   Diagnosis Date Noted  . Psychosis 11/23/2013  . Paranoia (HCC) 11/22/2013  . Fall from wheelchair 05/29/2013  . Anemia   . 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 Myoclonus 12/23/2012  . 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 Insomnia, unspecified 12/23/2012  . Menorrhagia with regular cycle 11/11/2012  . Mobility impaired 11/11/2012  . Wheelchair bound 11/11/2012    Past Surgical History:  Procedure Laterality Date  . EYE SURGERY     as a child  . HIP SURGERY Right 2003  . INTRAUTERINE DEVICE (IUD) INSERTION N/A 11/12/2012   Procedure: INTRAUTERINE DEVICE (IUD) INSERTION;  Surgeon: Sherron MondayJody Bovard, MD;  Location: [facility]  ORS;  Service: Gynecology;  Laterality: N/A;  . LEG SURGERY     as a child  . MULTIPLE TOOTH EXTRACTIONS  10/18/10    OB History    No data available       Home Medications    Prior to Admission medications   Medication Sig Start Date End Date Taking? Authorizing Provider  ARIPiprazole (ABILIFY) 2 MG tablet TAKE ONE TABLET BY MOUTH ONCE DAILY 05/17/16   Donita BrooksWarren T Pickard, MD  cephALEXin (KEFLEX) 500 MG capsule Take 1 capsule (500 mg total) by mouth 3 (three) times daily. 07/31/16   Salley ScarletKawanta F Ceiba, MD  clonazePAM (KLONOPIN) 1 MG tablet Take 1 tablet (1 mg total) by mouth every morning. 06/14/16   Donita BrooksWarren T Pickard, MD  clotrimazole (LOTRIMIN) 1 % cream Apply 1 application topically 2 (two) times daily. For 1 week 07/31/16   Salley ScarletKawanta F Rosemont, MD  glycopyrrolate (ROBINUL) 1 MG tablet TAKE ONE TABLET THREE TIMES DAILY 03/22/16   Donita BrooksWarren T Pickard, MD  levonorgestrel [facility](MIRENA) 20 MCG/24HR IUD 1 each by Intrauterine route once.    Historical Provider, MD    Family History Family History  Problem Relation Age of Onset  . Cancer Mother   . Diabetes Father   . Alzheimer's disease Maternal Grandfather   . Heart attack Paternal Grandmother     Died between 4060 or 25 years old    Social History Social History  Substance Use Topics  .  Smoking status: Never Smoker  . Smokeless tobacco: Never Used  . Alcohol use No     Comment: Pt denies     Allergies   Patient has no known allergies.   Review of Systems Review of Systems  All other systems reviewed and are negative.    Physical Exam Updated Vital Signs BP 107/75 (BP Location: Right Arm)   Pulse (!) 123   Temp 99.6 F (37.6 C) (Oral)   Resp 19   Ht 4\' 6"  (1.372 m)   Wt 52.2 kg   SpO2 98%   BMI 27.73 kg/m   Physical Exam  Constitutional: She appears well-developed and well-nourished. No distress.  HENT:  Head: Normocephalic and atraumatic.  Neck: Neck supple.  Cardiovascular: Normal rate and regular rhythm.     Pulmonary/Chest: Effort normal and breath sounds normal. No respiratory distress. She has no wheezes. She has no rales.  Abdominal: Soft. She exhibits no distension. There is tenderness (diffuse). There is no rebound and no guarding.  Neurological: She is alert.  Skin: She is not diaphoretic.  Nursing note and vitals reviewed.    ED Treatments / Results  Labs (all labs ordered are listed, but only abnormal results are displayed) Labs Reviewed  CBC - Abnormal; Notable for the following:       Result Value   WBC 23.9 (*)    RBC 5.19 (*)    Hemoglobin 16.8 (*)    HCT 49.0 (*)    All other components within normal limits  COMPREHENSIVE METABOLIC PANEL - Abnormal; Notable for the following:    Chloride 114 (*)    CO2 20 (*)    Glucose, Bld 112 (*)    Creatinine, Ser <0.30 (*)    Calcium 6.8 (*)    Total Protein 5.2 (*)    Albumin 2.8 (*)    Alkaline Phosphatase 27 (*)    All other components within normal limits  LIPASE, BLOOD  URINALYSIS, ROUTINE W REFLEX MICROSCOPIC    EKG  EKG Interpretation None       Radiology No results found.  Procedures Procedures (including critical care time)  Medications Ordered in ED Medications  morphine 4 MG/ML injection 4 mg (not administered)  sodium chloride 0.9 % bolus 1,000 mL (not administered)  sodium chloride 0.9 % bolus 1,000 mL (1,000 mLs Intravenous New Bag/Given 08/08/16 1919)  ondansetron (ZOFRAN) injection 4 mg (4 mg Intravenous Given 08/08/16 1919)  ketorolac (TORADOL) 30 MG/ML injection 30 mg (30 mg Intravenous Given 08/08/16 1919)     Initial Impression / Assessment and Plan / ED Course  I have reviewed the triage vital signs and the nursing notes.  Pertinent labs & imaging results that were available during my care of the patient were reviewed by me and considered in my medical decision making (see chart for details).  Clinical Course     Afebrile patient with abdominal pain, N/V/D that began today.  Pain is  diffuse.  Labs significant for leukocytosis.  IVF, zofran, toradol, morphine given with some improvement.  CT abd/pelvis pending at change of shift.  Signed out to TRW AutomotiveKelly Humes, PA-C, pending continued IVF and symptomatic treatment, CT.    Final Clinical Impressions(s) / ED Diagnoses   Final diagnoses:  Nausea vomiting and diarrhea    New Prescriptions New Prescriptions   No medications on file     Trixie Dredgemily Keiri Solano, PA-C 08/08/16 2101    Doug SouSam Jacubowitz, MD 08/08/16 2341

## 2016-08-09 ENCOUNTER — Emergency Department (HOSPITAL_COMMUNITY): Payer: Medicare Other

## 2016-08-09 ENCOUNTER — Encounter (HOSPITAL_COMMUNITY): Payer: Self-pay

## 2016-08-09 DIAGNOSIS — K5900 Constipation, unspecified: Secondary | ICD-10-CM | POA: Diagnosis not present

## 2016-08-09 DIAGNOSIS — G808 Other cerebral palsy: Secondary | ICD-10-CM

## 2016-08-09 DIAGNOSIS — R1084 Generalized abdominal pain: Secondary | ICD-10-CM | POA: Diagnosis not present

## 2016-08-09 DIAGNOSIS — F22 Delusional disorders: Secondary | ICD-10-CM | POA: Diagnosis not present

## 2016-08-09 DIAGNOSIS — Z8249 Family history of ischemic heart disease and other diseases of the circulatory system: Secondary | ICD-10-CM | POA: Diagnosis not present

## 2016-08-09 DIAGNOSIS — Z975 Presence of (intrauterine) contraceptive device: Secondary | ICD-10-CM | POA: Diagnosis not present

## 2016-08-09 DIAGNOSIS — R197 Diarrhea, unspecified: Secondary | ICD-10-CM

## 2016-08-09 DIAGNOSIS — K5641 Fecal impaction: Secondary | ICD-10-CM | POA: Diagnosis not present

## 2016-08-09 DIAGNOSIS — R933 Abnormal findings on diagnostic imaging of other parts of digestive tract: Secondary | ICD-10-CM | POA: Diagnosis not present

## 2016-08-09 DIAGNOSIS — F419 Anxiety disorder, unspecified: Secondary | ICD-10-CM | POA: Diagnosis present

## 2016-08-09 DIAGNOSIS — R6510 Systemic inflammatory response syndrome (SIRS) of non-infectious origin without acute organ dysfunction: Secondary | ICD-10-CM | POA: Diagnosis present

## 2016-08-09 DIAGNOSIS — I739 Peripheral vascular disease, unspecified: Secondary | ICD-10-CM | POA: Diagnosis present

## 2016-08-09 DIAGNOSIS — N200 Calculus of kidney: Secondary | ICD-10-CM | POA: Diagnosis present

## 2016-08-09 DIAGNOSIS — R112 Nausea with vomiting, unspecified: Secondary | ICD-10-CM | POA: Diagnosis present

## 2016-08-09 DIAGNOSIS — F79 Unspecified intellectual disabilities: Secondary | ICD-10-CM | POA: Diagnosis present

## 2016-08-09 DIAGNOSIS — R569 Unspecified convulsions: Secondary | ICD-10-CM | POA: Diagnosis present

## 2016-08-09 DIAGNOSIS — Z79899 Other long term (current) drug therapy: Secondary | ICD-10-CM | POA: Diagnosis not present

## 2016-08-09 DIAGNOSIS — K56609 Unspecified intestinal obstruction, unspecified as to partial versus complete obstruction: Secondary | ICD-10-CM | POA: Diagnosis not present

## 2016-08-09 DIAGNOSIS — N920 Excessive and frequent menstruation with regular cycle: Secondary | ICD-10-CM | POA: Diagnosis present

## 2016-08-09 DIAGNOSIS — E876 Hypokalemia: Secondary | ICD-10-CM | POA: Diagnosis present

## 2016-08-09 DIAGNOSIS — Z86718 Personal history of other venous thrombosis and embolism: Secondary | ICD-10-CM | POA: Diagnosis not present

## 2016-08-09 DIAGNOSIS — Z993 Dependence on wheelchair: Secondary | ICD-10-CM | POA: Diagnosis not present

## 2016-08-09 DIAGNOSIS — A0811 Acute gastroenteropathy due to Norwalk agent: Secondary | ICD-10-CM | POA: Diagnosis present

## 2016-08-09 LAB — LACTIC ACID, PLASMA
LACTIC ACID, VENOUS: 2.1 mmol/L — AB (ref 0.5–1.9)
LACTIC ACID, VENOUS: 0.8 mmol/L (ref 0.5–1.9)

## 2016-08-09 LAB — C DIFFICILE QUICK SCREEN W PCR REFLEX
C DIFFICLE (CDIFF) ANTIGEN: POSITIVE — AB
C Diff toxin: NEGATIVE

## 2016-08-09 LAB — PROTIME-INR
INR: 1.3
PROTHROMBIN TIME: 16.3 s — AB (ref 11.4–15.2)

## 2016-08-09 LAB — PREGNANCY, URINE: Preg Test, Ur: NEGATIVE

## 2016-08-09 LAB — PROCALCITONIN: Procalcitonin: 0.1 ng/mL

## 2016-08-09 LAB — APTT: aPTT: 28 seconds (ref 24–36)

## 2016-08-09 MED ORDER — POTASSIUM CHLORIDE CRYS ER 20 MEQ PO TBCR
40.0000 meq | EXTENDED_RELEASE_TABLET | Freq: Once | ORAL | Status: AC
  Administered 2016-08-09: 40 meq via ORAL
  Filled 2016-08-09: qty 2

## 2016-08-09 MED ORDER — CLONAZEPAM 1 MG PO TABS
1.0000 mg | ORAL_TABLET | Freq: Every morning | ORAL | Status: DC
  Administered 2016-08-09 – 2016-08-14 (×6): 1 mg via ORAL
  Filled 2016-08-09 (×6): qty 1

## 2016-08-09 MED ORDER — SODIUM CHLORIDE 0.9 % IV SOLN
INTRAVENOUS | Status: DC
  Administered 2016-08-09 – 2016-08-14 (×12): via INTRAVENOUS

## 2016-08-09 MED ORDER — IOPAMIDOL (ISOVUE-300) INJECTION 61%
100.0000 mL | Freq: Once | INTRAVENOUS | Status: AC | PRN
  Administered 2016-08-09: 80 mL via INTRAVENOUS

## 2016-08-09 MED ORDER — SODIUM CHLORIDE 0.9 % IV BOLUS (SEPSIS)
500.0000 mL | Freq: Once | INTRAVENOUS | Status: AC
  Administered 2016-08-09: 500 mL via INTRAVENOUS

## 2016-08-09 MED ORDER — SODIUM CHLORIDE 0.9 % IV SOLN
INTRAVENOUS | Status: DC
  Administered 2016-08-09: 05:00:00 via INTRAVENOUS

## 2016-08-09 MED ORDER — SODIUM CHLORIDE 0.9% FLUSH
3.0000 mL | Freq: Two times a day (BID) | INTRAVENOUS | Status: DC
  Administered 2016-08-09: 3 mL via INTRAVENOUS

## 2016-08-09 MED ORDER — CALCIUM GLUCONATE 10 % IV SOLN
2.0000 g | Freq: Once | INTRAVENOUS | Status: AC
  Administered 2016-08-09: 2 g via INTRAVENOUS
  Filled 2016-08-09: qty 20

## 2016-08-09 MED ORDER — HEPARIN SODIUM (PORCINE) 5000 UNIT/ML IJ SOLN
5000.0000 [IU] | Freq: Three times a day (TID) | INTRAMUSCULAR | Status: DC
  Administered 2016-08-09 – 2016-08-14 (×15): 5000 [IU] via SUBCUTANEOUS
  Filled 2016-08-09 (×15): qty 1

## 2016-08-09 MED ORDER — ONDANSETRON HCL 4 MG/2ML IJ SOLN: 4.0000 mg | Freq: Three times a day (TID) | INTRAMUSCULAR | Status: DC | PRN

## 2016-08-09 MED ORDER — SODIUM CHLORIDE 0.9 % IV BOLUS (SEPSIS)
1000.0000 mL | Freq: Once | INTRAVENOUS | Status: AC
  Administered 2016-08-09: 1000 mL via INTRAVENOUS

## 2016-08-09 MED ORDER — POLYETHYLENE GLYCOL 3350 17 G PO PACK
17.0000 g | PACK | ORAL | Status: DC
  Administered 2016-08-09 – 2016-08-10 (×8): 17 g via ORAL
  Filled 2016-08-09 (×5): qty 1

## 2016-08-09 MED ORDER — METRONIDAZOLE 500 MG PO TABS
500.0000 mg | ORAL_TABLET | Freq: Once | ORAL | Status: AC
  Administered 2016-08-09: 500 mg via ORAL
  Filled 2016-08-09: qty 1

## 2016-08-09 MED ORDER — ARIPIPRAZOLE 2 MG PO TABS
2.0000 mg | ORAL_TABLET | Freq: Every day | ORAL | Status: DC
  Administered 2016-08-09 – 2016-08-13 (×5): 2 mg via ORAL
  Filled 2016-08-09 (×6): qty 1

## 2016-08-09 MED ORDER — GLYCOPYRROLATE 1 MG PO TABS
1.0000 mg | ORAL_TABLET | Freq: Three times a day (TID) | ORAL | Status: DC
  Administered 2016-08-09: 1 mg via ORAL
  Filled 2016-08-09 (×2): qty 1

## 2016-08-09 MED ORDER — MINERAL OIL RE ENEM
1.0000 | ENEMA | Freq: Once | RECTAL | Status: AC
  Administered 2016-08-09: 1 via RECTAL
  Filled 2016-08-09 (×2): qty 1

## 2016-08-09 MED ORDER — MORPHINE SULFATE (PF) 2 MG/ML IV SOLN
1.0000 mg | INTRAVENOUS | Status: DC | PRN
  Administered 2016-08-09 – 2016-08-13 (×15): 1 mg via INTRAVENOUS
  Filled 2016-08-09 (×15): qty 1

## 2016-08-09 NOTE — Progress Notes (Signed)
This is a no charge note  Pending admission per PA, Nancy [name]  25 year old lady with past medical history of seizure, cerebral palsy, mental retardation, wheelchair bound, PVD, who presents with abdominal pain, N/V/D. Abdominal pain is diffuse, stabbing pain. No fevers. WBC 23.9, electrolytes and renal function okay, calcium 6.8 which is corrected to 7.76, negative urinalysis, negative pregnancy test, temperature normal, tachycardia, blood pressure 109/80. CT abdomen/pelvis showed fecal impaction causing bowel obstruction. Pt is accepted to tele bed for obs. Will give one dose of Flagyl empirically. Pending C. difficile PCR and GI pathogen panel. Give 2 g of calcium gluconate. Check lactic acid and pro-calcitonin.  Lorretta HarpXilin Jaculin Rasmus, MD  [facility] Pager [phone]  If 7PM-7AM, please contact night-coverage [email or web address] Password [email or web address] 08/09/2016, 4:01 AM

## 2016-08-09 NOTE — Progress Notes (Signed)
tap water and mineral oil enema given, pt has had a very small amount of stool output thus far.  MD made aware, will continue to monitor closely and folow out any new orders.

## 2016-08-09 NOTE — Progress Notes (Signed)
Tap water enema given per MD orders. Pt held enema for period of time and placed on bedpan. A large amount of brown, liquid stool collected in bedpan. Stool sample collected and sent to lab.

## 2016-08-09 NOTE — ED Provider Notes (Signed)
3:35 AM Patient care assumed from [facility], [place]PA-C. The patient is a 25 y/o female who is largely immobile 2/2 CP. Otherwise high functioning; communicative. Patient presenting for vomiting and diarrhea. She is persistently tachycardic with a leukocytosis of ~25k. BP soft, but stable. CT obtained to evaluate for cause of symptoms. CT shows a colonic obstruction secondary to constipation. Bladder is distended which may also be secondary to fecal impaction as UA does not suggest UTI. Patient with diffuse abdominal pain and TTP. Tachycardia has not responded to IVF. Unable to manually disimpact at bedside.   Plan for observation admission for additional IVF. Patient also likely to require enema and Miralax to try and promote movement of impaction. [facility].   Medications  metroNIDAZOLE (FLAGYL) tablet 500 mg (not administered)  0.9 %  sodium chloride infusion (not administered)  calcium gluconate 2 g in sodium chloride 0.9 % 100 mL IVPB (not administered)  ondansetron (ZOFRAN) injection 4 mg (not administered)  morphine 2 MG/ML injection 1 mg (not administered)  sodium chloride 0.9 % bolus 1,000 mL (0 mLs Intravenous Stopped 08/08/16 2136)  ondansetron (ZOFRAN) injection 4 mg (4 mg Intravenous Given 08/08/16 1919)  ketorolac (TORADOL) 30 MG/ML injection 30 mg (30 mg Intravenous Given 08/08/16 1919)  morphine 4 MG/ML injection 4 mg (4 mg Intravenous Given 08/08/16 2133)  sodium chloride 0.9 % bolus 1,000 mL (0 mLs Intravenous Stopped 08/09/16 0202)  sodium chloride 0.9 % injection (1 mL  Given by Other 08/09/16 0323)  iopamidol (ISOVUE-300) 61 % injection 100 mL (80 mLs Intravenous Contrast Given 08/09/16 0044)  sodium chloride 0.9 % bolus 1,000 mL (1,000 mLs Intravenous New Bag/Given 08/09/16 0429)      Antony MaduraKelly Docia Klar, PA-C 08/09/16 [id number]    Arby BarretteMarcy Pfeiffer, MD 08/09/16 [phone]

## 2016-08-09 NOTE — Progress Notes (Signed)
Disimpaction attempted by 2 RN's with no success.  MD made aware, will continue to monitor closely.

## 2016-08-09 NOTE — H&P (Signed)
History and Physical    Nancy [name]:[id number] DOB: 06-06-1991 DOA: 08/08/2016  PCP: Leo Grosser, MD Patient coming from: Home  Chief Complaint: Nausea/vomiting/diarrhea  HPI: Nancy [name] is a 25 y.o. female with medical history significant of congenital dysplasia, and Nancy [name],. Patient reports symptoms starting yesterday afternoon. She had 3 episodes of emesis and about 6 episodes of loose and watery stools without hematochezia or melena. She reports associated abdominal pain that's diffuse and has remain unchanged. She did not take anything to help with her symptoms and there is nothing specific that aggravated her symptoms. She called EMS for transport to the emergency department for evaluation.  ED Course: Vitals: tachycardia to 120s. Normotensive. On room air. Labs: corrected calcium of 7.8 Imaging: CT abdomen significant for rectal fecal impaction with distension and left 3mm non obstructing nephrolithiasis Medications/Course: Toradol and morphine given  Review of Systems: Review of Systems  Constitutional: Negative for chills, diaphoresis and fever.  Cardiovascular: Negative for chest pain, palpitations and leg swelling.  Gastrointestinal: Positive for abdominal pain, diarrhea, nausea and vomiting. Negative for constipation.  All other systems reviewed and are negative.   Past Medical History:  Diagnosis Date  . Anemia    h/o iron deficiency  . CP (cerebral palsy) (HCC)    with mild contracture  . IUD (intrauterine device) in place 10/2012  . Menorrhagia with regular cycle 11/11/2012  . Mobility impaired 11/11/2012  . Mood disorder (HCC)   . MR (mental retardation)   . Peripheral vascular disease (HCC) 10/02/2006   bilateral DVT lower legs  . Seizures (HCC)    childhood  . Wheelchair bound    r/t CP    Past Surgical History:  Procedure Laterality Date  . EYE SURGERY     as a child  . HIP SURGERY Right 2003  . INTRAUTERINE DEVICE (IUD)  INSERTION N/A 11/12/2012   Procedure: INTRAUTERINE DEVICE (IUD) INSERTION;  Surgeon: Sherron Monday, MD;  Location: [facility]S;  Service: Gynecology;  Laterality: N/A;  . LEG SURGERY     as a child  . MULTIPLE TOOTH EXTRACTIONS  10/18/10     reports that she has never smoked. She has never used smokeless tobacco. She reports that she does not drink alcohol or use drugs.  No Known Allergies  Family History  Problem Relation Age of Onset  . Cancer Mother   . Diabetes Father   . Alzheimer's disease Maternal Grandfather   . Heart attack Paternal Grandmother     Died between 24 or 67 years old    Prior to Admission medications   Medication Sig Start Date End Date Taking? Authorizing Provider  ARIPiprazole (ABILIFY) 2 MG tablet TAKE ONE TABLET BY MOUTH ONCE DAILY 05/17/16  Yes Donita Brooks, MD  clonazePAM (KLONOPIN) 1 MG tablet Take 1 tablet (1 mg total) by mouth every morning. 06/14/16  Yes Donita Brooks, MD  clotrimazole (LOTRIMIN) 1 % cream Apply 1 application topically 2 (two) times daily. For 1 week 07/31/16  Yes Salley Scarlet, MD  glycopyrrolate (ROBINUL) 1 MG tablet TAKE ONE TABLET THREE TIMES DAILY 03/22/16  Yes Donita Brooks, MD  levonorgestrel (MIRENA) 20 MCG/24HR IUD 1 each by Intrauterine route once.   Yes Historical Provider, MD  cephALEXin (KEFLEX) 500 MG capsule Take 1 capsule (500 mg total) by mouth 3 (three) times daily. Patient not taking: Reported on 08/09/2016 07/31/16   Salley Scarlet, MD    Physical Exam: Vitals:   08/09/16 0204 08/09/16  0352 08/09/16 0445 08/09/16 0730  BP: 109/80 109/82 109/68   Pulse: 120  (!) 130 (!) 116  Resp: 20 18 18    Temp: 98.6 F (37 C)  97.9 F (36.6 C)   TempSrc: Oral  Oral   SpO2: 96%  98%   Weight:   45.6 kg (100 lb 8 oz)   Height:   5\' 1"  (1.549 m)      Constitutional: NAD, calm, comfortable Eyes: PERRL, lids and conjunctivae normal ENMT: Mucous membranes are dry. Posterior pharynx clear of any exudate or  lesions. Neck: normal, supple, no masses, no thyromegaly Respiratory: clear to auscultation bilaterally, no wheezing, no crackles. Normal respiratory effort. No accessory muscle use.  Cardiovascular: Regular rate and rhythm, no murmurs / rubs / gallops. No extremity edema. 2+ pedal pulses. No carotid bruits.  Abdomen: diffuse tenderness with mild guarding, no masses palpated. Bowel sounds significantly hypoactive. Musculoskeletal: upper and lower extremity contractors. Decreased muscle mass Skin: no rashes, lesions, ulcers. No induration Neurologic: CN 2-12 grossly intact. Sensation intact, DTR normal. Strength 4/5 in upper extremity and 3/5 in lower extremity. Psychiatric: Normal judgment and insight. Alert and oriented x 3. Normal mood.    Labs on Admission: I have personally reviewed following labs and imaging studies  CBC:  Recent Labs Lab 08/08/16 1844  WBC 23.9*  HGB 16.8*  HCT 49.0*  MCV 94.4  PLT 386   Basic Metabolic Panel:  Recent Labs Lab 08/08/16 1920  NA 140  K 3.5  CL 114*  CO2 20*  GLUCOSE 112*  BUN 11  CREATININE <0.30*  CALCIUM 6.8*   GFR: CrCl cannot be calculated (This lab value cannot be used to calculate CrCl because it is not a number: <0.30). Liver Function Tests:  Recent Labs Lab 08/08/16 1920  AST 24  ALT 29  ALKPHOS 27*  BILITOT 0.9  PROT 5.2*  ALBUMIN 2.8*    Recent Labs Lab 08/08/16 1920  LIPASE 29   No results for input(s): AMMONIA in the last 168 hours. Coagulation Profile:  Recent Labs Lab 08/09/16 0425  INR 1.30   Cardiac Enzymes: No results for input(s): CKTOTAL, CKMB, CKMBINDEX, TROPONINI in the last 168 hours. BNP (last 3 results) No results for input(s): PROBNP in the last 8760 hours. HbA1C: No results for input(s): HGBA1C in the last 72 hours. CBG: No results for input(s): GLUCAP in the last 168 hours. Lipid Profile: No results for input(s): CHOL, HDL, LDLCALC, TRIG, CHOLHDL, LDLDIRECT in the last 72  hours. Thyroid Function Tests: No results for input(s): TSH, T4TOTAL, FREET4, T3FREE, THYROIDAB in the last 72 hours. Anemia Panel: No results for input(s): VITAMINB12, FOLATE, FERRITIN, TIBC, IRON, RETICCTPCT in the last 72 hours. Urine analysis:    Component Value Date/Time   COLORURINE AMBER (A) 08/08/2016 2304   APPEARANCEUR CLEAR 08/08/2016 2304   LABSPEC 1.029 08/08/2016 2304   PHURINE 5.0 08/08/2016 2304   GLUCOSEU NEGATIVE 08/08/2016 2304   HGBUR NEGATIVE 08/08/2016 2304   BILIRUBINUR SMALL (A) 08/08/2016 2304   KETONESUR 20 (A) 08/08/2016 2304   PROTEINUR NEGATIVE 08/08/2016 2304   UROBILINOGEN 0.2 12/03/2013 2242   NITRITE NEGATIVE 08/08/2016 2304   LEUKOCYTESUR NEGATIVE 08/08/2016 2304   Sepsis Labs: !!!!!!!!!!!!!!!!!!!!!!!!!!!!!!!!!!!!!!!!!!!! @LABRCNTIP (procalcitonin:4,lacticidven:4) ) Recent Results (from the past 240 hour(s))  WET PREP FOR TRICH, YEAST, CLUE     Status: Abnormal   Collection Time: 07/31/16  3:00 PM  Result Value Ref Range Status   Yeast Wet Prep HPF POC NONE SEEN NONE SEEN Final  Trich, Wet Prep NONE SEEN NONE SEEN Final   Clue Cells Wet Prep HPF POC FEW (A) NONE SEEN Final   WBC, Wet Prep HPF POC NONE SEEN NONE SEEN Final     Radiological Exams on Admission: Ct Abdomen Pelvis W Contrast  Result Date: 08/09/2016 CLINICAL DATA:  Nausea, vomiting diarrhea, generalized abdominal pain for 1 day. History of cerebral palsy and contracture. EXAM: CT ABDOMEN AND PELVIS WITH CONTRAST TECHNIQUE: Multidetector CT imaging of the abdomen and pelvis was performed using the standard protocol following bolus administration of intravenous contrast. CONTRAST:  80mL ISOVUE-300 IOPAMIDOL (ISOVUE-300) INJECTION 61% COMPARISON:  None. FINDINGS: LOWER CHEST: Lung bases are clear. Included heart size is normal. No pericardial effusion. HEPATOBILIARY: LEFT lobe of the liver is partially herniated through the crus of the diaphragm. Low-density liver compatible with  steatosis. Normal gallbladder. PANCREAS: Normal. SPLEEN: Normal. ADRENALS/URINARY TRACT: Kidneys are orthotopic, demonstrating symmetric enhancement. No hydronephrosis or solid renal masses. 9 mm cyst RIGHT interpolar kidney. Too small to characterize hypodensities in the kidneys bilaterally. 3 mm LEFT upper pole nephrolithiasis. The unopacified ureters are normal in course and caliber. Distended urinary bladder to the level of the umbilicus. Normal adrenal glands. STOMACH/BOWEL: Small hiatal hernia. Rectum distended with feces, 8.5 cm in transaxial dimension. VASCULAR/LYMPHATIC: Aortoiliac vessels are normal in course and caliber. Diminutive intrahepatic inferior vena cava. No lymphadenopathy by CT size criteria. REPRODUCTIVE: IUD in central uterus. Stool and fluid distended large bowel with air-fluid levels, 6 cm in transaxial dimension. Small bowel air-fluid levels. OTHER: No intraperitoneal free fluid or free air. MUSCULOSKELETAL: Nonacute. RIGHT lateral abdominal wall varicosities. Broad thoracolumbar dextroscoliosis. Shallow LEFT acetabulum without dislocation. 7 mm probable Bartholin cyst. IMPRESSION: CT findings of fecal impaction and resulting bowel obstruction. Distended urinary bladder, recommend correlation with urinary output. RIGHT abdominal wall varicosities. Diminutive intrahepatic inferior vena cava, not tailored for evaluation. 3 mm nonobstructing LEFT nephrolithiasis. Electronically Signed   By: Awilda Metroourtnay  Bloomer M.D.   On: 08/09/2016 01:42    EKG: None obtained.  Assessment/Plan Principal Problem:   Nausea vomiting and diarrhea Active Problems:   Congenital quadriplegia (HCC)   Paranoia (HCC)   SIRS (systemic inflammatory response syndrome) (HCC)   Fecal impaction (HCC)   Fecal impaction Seen on CT scan with concern for colonic obstruction. Discussed with Surgery who recommended medical management with enemas and GI consult if no improvement. Possibly secondary to Rubinol. -water  enema -mineral oil enema -sips with meds -MIVF  Diarrhea Likely encopresis from impaction. -C. diff and GI panel pending  Nausea/vomiting Secondary to fecal impaction with obstruction. Not distended and appears to have resolved -Place NG if returns or if distended  Anxiety -Continue Klonopin  Paranoia/psychosis -Continue Abilify   DVT prophylaxis: Heparin Code Status: Full code Family Communication: None at bedside Disposition Plan: Anticipate discharge in 1-2 days Consults called: [facility] Admission status: Inpatient, medical floor   Jacquelin Hawkingalph Zoa Dowty, MD [facility] Pager [phone]  If 7PM-7AM, please contact night-coverage [email or web address] Password [email or web address]  08/09/2016, 9:36 AM

## 2016-08-09 NOTE — Progress Notes (Signed)
Per MD verbal order, bladder scan performed. 20 mL of volume in bladder.

## 2016-08-09 NOTE — Progress Notes (Signed)
Urine and liquid stool combined, unable to collect stool for sample.

## 2016-08-10 ENCOUNTER — Inpatient Hospital Stay (HOSPITAL_COMMUNITY): Payer: Medicare Other

## 2016-08-10 LAB — GASTROINTESTINAL PANEL BY PCR, STOOL (REPLACES STOOL CULTURE)
ASTROVIRUS: NOT DETECTED
Adenovirus F40/41: NOT DETECTED
CYCLOSPORA CAYETANENSIS: NOT DETECTED
Campylobacter species: NOT DETECTED
Cryptosporidium: NOT DETECTED
E. COLI O157: NOT DETECTED
ENTEROTOXIGENIC E COLI (ETEC): NOT DETECTED
Entamoeba histolytica: NOT DETECTED
Enteroaggregative E coli (EAEC): NOT DETECTED
Enteropathogenic E coli (EPEC): NOT DETECTED
Giardia lamblia: NOT DETECTED
NOROVIRUS GI/GII: DETECTED — AB
Plesimonas shigelloides: NOT DETECTED
ROTAVIRUS A: NOT DETECTED
SAPOVIRUS (I, II, IV, AND V): NOT DETECTED
SHIGA LIKE TOXIN PRODUCING E COLI (STEC): NOT DETECTED
Salmonella species: NOT DETECTED
Shigella/Enteroinvasive E coli (EIEC): NOT DETECTED
VIBRIO SPECIES: NOT DETECTED
Vibrio cholerae: NOT DETECTED
Yersinia enterocolitica: NOT DETECTED

## 2016-08-10 LAB — CBC
HCT: 35.7 % — ABNORMAL LOW (ref 36.0–46.0)
HEMOGLOBIN: 12.3 g/dL (ref 12.0–15.0)
MCH: 31.9 pg (ref 26.0–34.0)
MCHC: 34.5 g/dL (ref 30.0–36.0)
MCV: 92.5 fL (ref 78.0–100.0)
Platelets: 248 10*3/uL (ref 150–400)
RBC: 3.86 MIL/uL — AB (ref 3.87–5.11)
RDW: 13.5 % (ref 11.5–15.5)
WBC: 8.9 10*3/uL (ref 4.0–10.5)

## 2016-08-10 LAB — BASIC METABOLIC PANEL
ANION GAP: 6 (ref 5–15)
BUN: <5 mg/dL — ABNORMAL LOW (ref 6–20)
CO2: 20 mmol/L — ABNORMAL LOW (ref 22–32)
Calcium: 7.8 mg/dL — ABNORMAL LOW (ref 8.9–10.3)
Chloride: 110 mmol/L (ref 101–111)
Creatinine, Ser: 0.32 mg/dL — ABNORMAL LOW (ref 0.44–1.00)
Glucose, Bld: 78 mg/dL (ref 65–99)
POTASSIUM: 3.7 mmol/L (ref 3.5–5.1)
SODIUM: 136 mmol/L (ref 135–145)

## 2016-08-10 LAB — CLOSTRIDIUM DIFFICILE BY PCR

## 2016-08-10 MED ORDER — POLYETHYLENE GLYCOL 3350 17 GM/SCOOP PO POWD
1.0000 | Freq: Once | ORAL | Status: AC
  Administered 2016-08-10: 255 g via ORAL
  Filled 2016-08-10: qty 255

## 2016-08-10 NOTE — Progress Notes (Signed)
CRITICAL VALUE ALERT  Critical value received:  GI Panel- Positive Norovirus GI/GII  Date of notification:  08/10/16  Time of notification:  0430  Critical value read back:Yes.    Nurse who received alert:  Elisabeth PigeonMaria De Leon   MD notified (1st page):  Schorr  Time of first page:  0630  MD notified (2nd page):  Time of second page:  Responding MD: Schorr  Time MD responded:  [phone]

## 2016-08-10 NOTE — Progress Notes (Signed)
Pt incontinent of moderate amount of loose brown stool

## 2016-08-10 NOTE — Progress Notes (Signed)
PROGRESS NOTE  Nancy [name]  ZOX:[id number] DOB: 09-27-1990 DOA: 08/08/2016 PCP: Leo Grosser, MD  Outpatient Specialists: Dr. Thereasa Solo - [facility]  Brief Narrative: Nancy [name] is a 25 y.o. female with a history of CP, mild MR, mood disorder, and seizures who presented 12/14 for emesis, loose stools and diffuse abdominal pain. without hematochezia or melena. On arrival she was tachycardic and normotensive. CT abdomen findings of fecal impaction and resulting bowel obstruction.     Assessment & Plan: Principal Problem:   Nausea vomiting and diarrhea Active Problems:   Congenital quadriplegia (HCC)   Paranoia (HCC)   SIRS (systemic inflammatory response syndrome) (HCC)   Fecal impaction (HCC)  Fecal impaction: Continues despite overflow encopresis with multiple stools/enemas as evidenced on abd XR this AM. Not able to palpate impaction on exam.  - Appreciate GI assistance. ?May need flex sig. Will keep NPO.  Noroviral gastroenteritis: CDiff panel showed CDiff without toxin and no PCR was able to be performed. Now menstruating and incontinent with multiple laxatives/enemas limiting yield of recollection.  - Because norovirus alone would explain these findings, will hold empiric CDiff treatment. Were a leukocytosis, fever, or worsening of abdominal pain to occur, would start empiric vancomycin po.  - Continue IVF's, advance diet as tolerated with antiemetics  Distended urinary bladder seen on CT. Resolved. Bladder scan showed 20cc indicating no retention.  - Monitor UOP.  Anxiety/history of paranoia: Chronic and stable.  - Continue home klonopin and abilify  Menorrhagia with normal cycle: Chronic, currently menstruating. IUD seen on CT in correct position.  - Drop in hgb noted (16.8 > 12.3), suspect significant dilution as this includes all cell lines. - Recheck CBC in AM  DVT prophylaxis: Heparin Code Status: Full code Family  Communication: Father at bedside Disposition Plan: Inpatient management. Anticipate eventual home disposition.  Consultants:   [facility], Dr. Bosie Clos  Procedures:   None  Antimicrobials:  Flagyl x1 12/14   Subjective: Pt still with nausea, diarrhea, and abdominal pain.   Objective: Vitals:   08/09/16 0730 08/09/16 1400 08/09/16 2114 08/10/16 0450  BP:  105/70 (!) 114/47 (!) 106/42  Pulse: (!) 116 (!) 105 (!) 107 85  Resp:  18 18 18   Temp:  98.1 F (36.7 C) 98 F (36.7 C) 98.7 F (37.1 C)  TempSrc:  Oral Oral Oral  SpO2:  100% 97% 90%  Weight:      Height:        Intake/Output Summary (Last 24 hours) at 08/10/16 1154 Last data filed at 08/10/16 0600  Gross per 24 hour  Intake          1791.33 ml  Output                0 ml  Net          1791.33 ml   Filed Weights   08/08/16 1802 08/09/16 0445  Weight: 52.2 kg (115 lb) 45.6 kg (100 lb 8 oz)    Examination: General exam: 25 y.o. female in no distress Respiratory system: Non-labored breathing room air. Clear to auscultation bilaterally.  Cardiovascular system: Regular rate and rhythm. No murmur, rub, or gallop. No JVD, and no pedal edema. Gastrointestinal system: Abdomen soft, diffusely tender, non-distended, with hypoactive bowel sounds. No palpable impaction on DRE.  GU: Limited red blood from vagina.  Central nervous system: Alert and oriented. Spastic global weakness. UE's 4/5, LE's 3/5 strength.  MSK: Warm extremities, severe scoliosis. Skin: No rashes, lesions No ulcers  Psychiatry: Judgement and insight appear normal. Mood & affect appropriate. Cognition below average but able to comprehend information.  Data Reviewed: I have personally reviewed following labs and imaging studies  CBC:  Recent Labs Lab 08/08/16 1844 08/10/16 0428  WBC 23.9* 8.9  HGB 16.8* 12.3  HCT 49.0* 35.7*  MCV 94.4 92.5  PLT 386 248   Basic Metabolic Panel:  Recent Labs Lab 08/08/16 1920 08/10/16 0428  NA 140 136  K  3.5 3.7  CL 114* 110  CO2 20* 20*  GLUCOSE 112* 78  BUN 11 <5*  CREATININE <0.30* 0.32*  CALCIUM 6.8* 7.8*   GFR: Estimated Creatinine Clearance: 76.7 mL/min (by C-G formula based on SCr of 0.32 mg/dL (L)). Liver Function Tests:  Recent Labs Lab 08/08/16 1920  AST 24  ALT 29  ALKPHOS 27*  BILITOT 0.9  PROT 5.2*  ALBUMIN 2.8*    Recent Labs Lab 08/08/16 1920  LIPASE 29   No results for input(s): AMMONIA in the last 168 hours. Coagulation Profile:  Recent Labs Lab 08/09/16 0425  INR 1.30   Cardiac Enzymes: No results for input(s): CKTOTAL, CKMB, CKMBINDEX, TROPONINI in the last 168 hours. BNP (last 3 results) No results for input(s): PROBNP in the last 8760 hours. HbA1C: No results for input(s): HGBA1C in the last 72 hours. CBG: No results for input(s): GLUCAP in the last 168 hours. Lipid Profile: No results for input(s): CHOL, HDL, LDLCALC, TRIG, CHOLHDL, LDLDIRECT in the last 72 hours. Thyroid Function Tests: No results for input(s): TSH, T4TOTAL, FREET4, T3FREE, THYROIDAB in the last 72 hours. Anemia Panel: No results for input(s): VITAMINB12, FOLATE, FERRITIN, TIBC, IRON, RETICCTPCT in the last 72 hours. Urine analysis:    Component Value Date/Time   COLORURINE AMBER (A) 08/08/2016 2304   APPEARANCEUR CLEAR 08/08/2016 2304   LABSPEC 1.029 08/08/2016 2304   PHURINE 5.0 08/08/2016 2304   GLUCOSEU NEGATIVE 08/08/2016 2304   HGBUR NEGATIVE 08/08/2016 2304   BILIRUBINUR SMALL (A) 08/08/2016 2304   KETONESUR 20 (A) 08/08/2016 2304   PROTEINUR NEGATIVE 08/08/2016 2304   UROBILINOGEN 0.2 12/03/2013 2242   NITRITE NEGATIVE 08/08/2016 2304   LEUKOCYTESUR NEGATIVE 08/08/2016 2304   Sepsis Labs: @LABRCNTIP (procalcitonin:4,lacticidven:4)  ) Recent Results (from the past 240 hour(s))  WET PREP FOR TRICH, YEAST, CLUE     Status: Abnormal   Collection Time: 07/31/16  3:00 PM  Result Value Ref Range Status   Yeast Wet Prep HPF POC NONE SEEN NONE SEEN Final     Trich, Wet Prep NONE SEEN NONE SEEN Final   Clue Cells Wet Prep HPF POC FEW (A) NONE SEEN Final   WBC, Wet Prep HPF POC NONE SEEN NONE SEEN Final  C difficile quick screen w PCR reflex     Status: Abnormal   Collection Time: 08/09/16  1:41 PM  Result Value Ref Range Status   C Diff antigen POSITIVE (A) NEGATIVE Final   C Diff toxin NEGATIVE NEGATIVE Final   C Diff interpretation Results are indeterminate. See PCR results.  Final  Gastrointestinal Panel by PCR , Stool     Status: Abnormal   Collection Time: 08/09/16  1:41 PM  Result Value Ref Range Status   Campylobacter species NOT DETECTED NOT DETECTED Final   Plesimonas shigelloides NOT DETECTED NOT DETECTED Final   Salmonella species NOT DETECTED NOT DETECTED Final   Yersinia enterocolitica NOT DETECTED NOT DETECTED Final   Vibrio species NOT DETECTED NOT DETECTED Final   Vibrio cholerae NOT DETECTED NOT DETECTED  Final   Enteroaggregative E coli (EAEC) NOT DETECTED NOT DETECTED Final   Enteropathogenic E coli (EPEC) NOT DETECTED NOT DETECTED Final   Enterotoxigenic E coli (ETEC) NOT DETECTED NOT DETECTED Final   Shiga like toxin producing E coli (STEC) NOT DETECTED NOT DETECTED Final   E. coli O157 NOT DETECTED NOT DETECTED Final   Shigella/Enteroinvasive E coli (EIEC) NOT DETECTED NOT DETECTED Final   Cryptosporidium NOT DETECTED NOT DETECTED Final   Cyclospora cayetanensis NOT DETECTED NOT DETECTED Final   Entamoeba histolytica NOT DETECTED NOT DETECTED Final   Giardia lamblia NOT DETECTED NOT DETECTED Final   Adenovirus F40/41 NOT DETECTED NOT DETECTED Final   Astrovirus NOT DETECTED NOT DETECTED Final   Norovirus GI/GII DETECTED (A) NOT DETECTED Final    Comment: SPOKE WITH MARIA DELEON RN AT 0411 08/10/16 SDR   Rotavirus A NOT DETECTED NOT DETECTED Final   Sapovirus (I, II, IV, and V) NOT DETECTED NOT DETECTED Final  Clostridium Difficile by PCR     Status: Abnormal   Collection Time: 08/09/16  1:41 PM  Result Value Ref  Range Status   Toxigenic C Difficile by pcr QUANTITY NOT SUFFICIENT TO REPEAT TEST (A) NEGATIVE Final    CommentByrd Hesselbach: MARIA, RN AWARE AT 2200 08/09/16 SCOTTEN,M SEE RECOLLECT [id number]      Radiology Studies: Dg Abd 1 View  Result Date: 08/10/2016 CLINICAL DATA:  Fecal impaction EXAM: ABDOMEN - 1 VIEW COMPARISON:  Abdominal CT scan of August 09, 2016 FINDINGS: There is a large amount of stool in the rectum. Proximal to this the colon and portions of the small bowel exhibit moderate. No free extraluminal gas is observed. There is severe lumbar scoliosis convex toward the right with a rotatory component. Gaseous distention. IMPRESSION: Fecal impaction. Moderate gaseous distention of bowel proximal to the rectum. Electronically Signed   By: David  SwazilandJordan M.D.   On: 08/10/2016 07:06   Ct Abdomen Pelvis W Contrast  Result Date: 08/09/2016 CLINICAL DATA:  Nausea, vomiting diarrhea, generalized abdominal pain for 1 day. History of cerebral palsy and contracture. EXAM: CT ABDOMEN AND PELVIS WITH CONTRAST TECHNIQUE: Multidetector CT imaging of the abdomen and pelvis was performed using the standard protocol following bolus administration of intravenous contrast. CONTRAST:  80mL ISOVUE-300 IOPAMIDOL (ISOVUE-300) INJECTION 61% COMPARISON:  None. FINDINGS: LOWER CHEST: Lung bases are clear. Included heart size is normal. No pericardial effusion. HEPATOBILIARY: LEFT lobe of the liver is partially herniated through the crus of the diaphragm. Low-density liver compatible with steatosis. Normal gallbladder. PANCREAS: Normal. SPLEEN: Normal. ADRENALS/URINARY TRACT: Kidneys are orthotopic, demonstrating symmetric enhancement. No hydronephrosis or solid renal masses. 9 mm cyst RIGHT interpolar kidney. Too small to characterize hypodensities in the kidneys bilaterally. 3 mm LEFT upper pole nephrolithiasis. The unopacified ureters are normal in course and caliber. Distended urinary bladder to the level of the umbilicus.  Normal adrenal glands. STOMACH/BOWEL: Small hiatal hernia. Rectum distended with feces, 8.5 cm in transaxial dimension. VASCULAR/LYMPHATIC: Aortoiliac vessels are normal in course and caliber. Diminutive intrahepatic inferior vena cava. No lymphadenopathy by CT size criteria. REPRODUCTIVE: IUD in central uterus. Stool and fluid distended large bowel with air-fluid levels, 6 cm in transaxial dimension. Small bowel air-fluid levels. OTHER: No intraperitoneal free fluid or free air. MUSCULOSKELETAL: Nonacute. RIGHT lateral abdominal wall varicosities. Broad thoracolumbar dextroscoliosis. Shallow LEFT acetabulum without dislocation. 7 mm probable Bartholin cyst. IMPRESSION: CT findings of fecal impaction and resulting bowel obstruction. Distended urinary bladder, recommend correlation with urinary output. RIGHT abdominal wall varicosities. Diminutive intrahepatic  inferior vena cava, not tailored for evaluation. 3 mm nonobstructing LEFT nephrolithiasis. Electronically Signed   By: Awilda Metro M.D.   On: 08/09/2016 01:42    Scheduled Meds: . ARIPiprazole  2 mg Oral Daily  . clonazePAM  1 mg Oral q morning - 10a  . heparin  5,000 Units Subcutaneous Q8H  . sodium chloride flush  3 mL Intravenous Q12H   Continuous Infusions: . sodium chloride 100 mL/hr at 08/10/16 0804     LOS: 1 day   Time spent: 25 minutes.  Hazeline Junker, MD [facility] Pager [phone]  If 7PM-7AM, please contact night-coverage [email or web address] Password [email or web address] 08/10/2016, 11:54 AM

## 2016-08-10 NOTE — Consult Note (Signed)
Referring Provider: Dr. Caleb PoppNettey Primary Care Physician:  Leo GrosserPICKARD,WARREN TOM, MD Primary Gastroenterologist:  Gentry FitzUnassigned  Reason for Consultation:  Fecal Impaction  HPI: Nancy [name] General is a 25 y.o. female with cerebral palsy and congenital quadriplegia seen for a consult due to fecal impaction. She has had constipation in the past per her father but he feels that her constipation worsened when she was given an antibiotic recently. CT yesterday showed "fecal impaction and resulting bowel obstruction." Xray today again showed fecal impaction. Has had some loose stools since admit but unsuccessful disimpaction by nursing on admit. Minimal stool output following a tap water enema and mineral oil enema yesterday. She has had diffuse abdominal pain. Limited history from patient. C. Diff antigen positive with negative toxin. Father at bedside.   Past Medical History:  Diagnosis Date  . Anemia    h/o iron deficiency  . CP (cerebral palsy) (HCC)    with mild contracture  . IUD (intrauterine device) in place 10/2012  . Menorrhagia with regular cycle 11/11/2012  . Mobility impaired 11/11/2012  . Mood disorder (HCC)   . MR (mental retardation)   . Peripheral vascular disease (HCC) 10/02/2006   bilateral DVT lower legs  . Seizures (HCC)    childhood  . Wheelchair bound    r/t CP    Past Surgical History:  Procedure Laterality Date  . EYE SURGERY     as a child  . HIP SURGERY Right 2003  . INTRAUTERINE DEVICE (IUD) INSERTION [name]/A 11/12/2012   Procedure: INTRAUTERINE DEVICE (IUD) INSERTION;  Surgeon: Sherron MondayJody Bovard, MD;  Location: [facility];  Service: Gynecology;  Laterality: [name]/A;  . LEG SURGERY     as a child  . MULTIPLE TOOTH EXTRACTIONS  10/18/10    Prior to Admission medications   Medication Sig Start Date End Date Taking? Authorizing Provider  ARIPiprazole (ABILIFY) 2 MG tablet TAKE ONE TABLET BY MOUTH ONCE DAILY 05/17/16  Yes Donita BrooksWarren T Pickard, MD  clonazePAM (KLONOPIN) 1 MG tablet Take 1 tablet  (1 mg total) by mouth every morning. 06/14/16  Yes Donita BrooksWarren T Pickard, MD  clotrimazole (LOTRIMIN) 1 % cream Apply 1 application topically 2 (two) times daily. For 1 week 07/31/16  Yes Salley ScarletKawanta F [place], MD  glycopyrrolate (ROBINUL) 1 MG tablet TAKE ONE TABLET THREE TIMES DAILY 03/22/16  Yes Donita BrooksWarren T Pickard, MD  levonorgestrel (MIRENA) 20 MCG/24HR IUD 1 each by Intrauterine route once.   Yes Historical Provider, MD  cephALEXin (KEFLEX) 500 MG capsule Take 1 capsule (500 mg total) by mouth 3 (three) times daily. Patient not taking: Reported on 08/09/2016 07/31/16   Salley ScarletKawanta F [place], MD    Scheduled Meds: . ARIPiprazole  2 mg Oral Daily  . clonazePAM  1 mg Oral q morning - 10a  . heparin  5,000 Units Subcutaneous Q8H  . sodium chloride flush  3 mL Intravenous Q12H   Continuous Infusions: . sodium chloride 100 mL/hr at 08/10/16 0804   PRN Meds:.morphine injection, ondansetron  Allergies as of 08/08/2016  . (No Known Allergies)    Family History  Problem Relation Age of Onset  . Cancer Mother   . Diabetes Father   . Alzheimer's disease Maternal Grandfather   . Heart attack Paternal Grandmother     Died between 6560 or 25 years old    Social History   Social History  . Marital status: Single    Spouse name: [name]/A  . Number of children: [name]/A  . Years of education: [name]/A  Occupational History  . Not on file.   Social History Main Topics  . Smoking status: Never Smoker  . Smokeless tobacco: Never Used  . Alcohol use No     Comment: Pt denies  . Drug use: No     Comment: Pt denies  . Sexual activity: No     Comment: lives in a group home.  single.   Other Topics Concern  . Not on file   Social History Narrative  . No narrative on file    Review of Systems: All negative except as stated above in HPI.  Physical Exam: Vital signs: Vitals:   08/09/16 2114 08/10/16 0450  BP: (!) 114/47 (!) 106/42  Pulse: (!) 107 85  Resp: 18 18  Temp: 98 F (36.7 C) 98.7 F (37.1 C)    Last BM Date: 08/09/16 General:   Lethargic, contractures, thin, no acute distress HEENT: anicteric sclera Lungs:  Clear throughout to auscultation.   No wheezes, crackles, or rhonchi. No acute distress. Heart:  Regular rate and rhythm; no murmurs, clicks, rubs,  or gallops. Abdomen: diffuse tenderness with guarding, soft, nondistended, +BS  Rectal:  Deferred Ext: no edema  GI:  Lab Results:  Recent Labs  08/08/16 1844 08/10/16 0428  WBC 23.9* 8.9  HGB 16.8* 12.3  HCT 49.0* 35.7*  PLT 386 248   BMET  Recent Labs  08/08/16 1920 08/10/16 0428  NA 140 136  K 3.5 3.7  CL 114* 110  CO2 20* 20*  GLUCOSE 112* 78  BUN 11 <5*  CREATININE <0.30* 0.32*  CALCIUM 6.8* 7.8*   LFT  Recent Labs  08/08/16 1920  PROT 5.2*  ALBUMIN 2.8*  AST 24  ALT 29  ALKPHOS 27*  BILITOT 0.9   PT/INR  Recent Labs  08/09/16 0425  LABPROT 16.3*  INR 1.30     Studies/Results: Dg Abd 1 View  Result Date: 08/10/2016 CLINICAL DATA:  Fecal impaction EXAM: ABDOMEN - 1 VIEW COMPARISON:  Abdominal CT scan of August 09, 2016 FINDINGS: There is a large amount of stool in the rectum. Proximal to this the colon and portions of the small bowel exhibit moderate. No free extraluminal gas is observed. There is severe lumbar scoliosis convex toward the right with a rotatory component. Gaseous distention. IMPRESSION: Fecal impaction. Moderate gaseous distention of bowel proximal to the rectum. Electronically Signed   By: David  [place] M.D.   On: 08/10/2016 07:06   Ct Abdomen Pelvis W Contrast  Result Date: 08/09/2016 CLINICAL DATA:  Nausea, vomiting diarrhea, generalized abdominal pain for 1 day. History of cerebral palsy and contracture. EXAM: CT ABDOMEN AND PELVIS WITH CONTRAST TECHNIQUE: Multidetector CT imaging of the abdomen and pelvis was performed using the standard protocol following bolus administration of intravenous contrast. CONTRAST:  80mL ISOVUE-300 IOPAMIDOL (ISOVUE-300) INJECTION  61% COMPARISON:  None. FINDINGS: LOWER CHEST: Lung bases are clear. Included heart size is normal. No pericardial effusion. HEPATOBILIARY: LEFT lobe of the liver is partially herniated through the crus of the diaphragm. Low-density liver compatible with steatosis. Normal gallbladder. PANCREAS: Normal. SPLEEN: Normal. ADRENALS/URINARY TRACT: Kidneys are orthotopic, demonstrating symmetric enhancement. No hydronephrosis or solid renal masses. 9 mm cyst RIGHT interpolar kidney. Too small to characterize hypodensities in the kidneys bilaterally. 3 mm LEFT upper pole nephrolithiasis. The unopacified ureters are normal in course and caliber. Distended urinary bladder to the level of the umbilicus. Normal adrenal glands. STOMACH/BOWEL: Small hiatal hernia. Rectum distended with feces, 8.5 cm in transaxial dimension. VASCULAR/LYMPHATIC: Aortoiliac vessels  are normal in course and caliber. Diminutive intrahepatic inferior vena cava. No lymphadenopathy by CT size criteria. REPRODUCTIVE: IUD in central uterus. Stool and fluid distended large bowel with air-fluid levels, 6 cm in transaxial dimension. Small bowel air-fluid levels. OTHER: No intraperitoneal free fluid or free air. MUSCULOSKELETAL: Nonacute. RIGHT lateral abdominal wall varicosities. Broad thoracolumbar dextroscoliosis. Shallow LEFT acetabulum without dislocation. 7 mm probable Bartholin cyst. IMPRESSION: CT findings of fecal impaction and resulting bowel obstruction. Distended urinary bladder, recommend correlation with urinary output. RIGHT abdominal wall varicosities. Diminutive intrahepatic inferior vena cava, not tailored for evaluation. 3 mm nonobstructing LEFT nephrolithiasis. Electronically Signed   By: Awilda Metroourtnay  Bloomer M.D.   On: 08/09/2016 01:42    Impression/Plan: Fecal impaction in a patient with quadriplegia and cerebral palsy. C. Diff antigen positive but toxin negative and not having signs of an active C. Diff infection so I do not think  treatment is necessary for C. Diff at this time. Will do Miralax bowel prep and hope that aggressive bowel prep orally will dislodge this fecal impaction. May need repeat tap water enemas this weekend after completing the Miralax prep and if no success with Miralax will need GoLytely bowel prep. If still no success over the next 2-3 days, then may need a flexible sigmoidoscopy for dislodging the stool but that would be a last resort. Will need to take Miralax as needed as an outpt as well. Dr. Dulce Sellarutlaw available to see this weekend if needed.    LOS: 1 day   Abbegale Stehle C.  08/10/2016, 1:58 PM  Pager [phone]  If no answer or after 5 PM call [phone]

## 2016-08-11 ENCOUNTER — Inpatient Hospital Stay (HOSPITAL_COMMUNITY): Payer: Medicare Other

## 2016-08-11 LAB — CBC
HEMATOCRIT: 36.9 % (ref 36.0–46.0)
HEMOGLOBIN: 12.7 g/dL (ref 12.0–15.0)
MCH: 31.7 pg (ref 26.0–34.0)
MCHC: 34.4 g/dL (ref 30.0–36.0)
MCV: 92 fL (ref 78.0–100.0)
Platelets: 304 10*3/uL (ref 150–400)
RBC: 4.01 MIL/uL (ref 3.87–5.11)
RDW: 13.1 % (ref 11.5–15.5)
WBC: 6.5 10*3/uL (ref 4.0–10.5)

## 2016-08-11 MED ORDER — BISACODYL 10 MG RE SUPP
10.0000 mg | Freq: Three times a day (TID) | RECTAL | Status: DC
  Administered 2016-08-11 – 2016-08-13 (×7): 10 mg via RECTAL
  Filled 2016-08-11 (×7): qty 1

## 2016-08-11 NOTE — Progress Notes (Signed)
Subjective: Minimal output after start of Miralax prep. Denies abdominal pain. No vomiting with clear liquids.  Objective: Vital signs in last 24 hours: Temp:  [98 F (36.7 C)-98.5 F (36.9 C)] 98 F (36.7 C) (12/16 0552) Pulse Rate:  [91-92] 91 (12/16 0552) Resp:  [16-49] 49 (12/16 0552) BP: (107)/(59-68) 107/68 (12/16 0552) SpO2:  [99 %-100 %] 100 % (12/16 0552) Weight change:  Last BM Date: 08/10/16  PE: GEN:  Contractured, quadriplegic, slow and deliberate diction ABD:  Soft, mild generalized tenderness, mild distention with tympany, present but hypoactive bowel sounds  Lab Results: CBC    Component Value Date/Time   WBC 6.5 08/11/2016 0502   RBC 4.01 08/11/2016 0502   HGB 12.7 08/11/2016 0502   HCT 36.9 08/11/2016 0502   PLT 304 08/11/2016 0502   MCV 92.0 08/11/2016 0502   MCH 31.7 08/11/2016 0502   MCHC 34.4 08/11/2016 0502   RDW 13.1 08/11/2016 0502   LYMPHSABS 2.5 09/06/2015 1635   MONOABS 0.4 09/06/2015 1635   EOSABS 0.1 09/06/2015 1635   BASOSABS 0.0 09/06/2015 1635   CMP     Component Value Date/Time   NA 136 08/10/2016 0428   K 3.7 08/10/2016 0428   CL 110 08/10/2016 0428   CO2 20 (L) 08/10/2016 0428   GLUCOSE 78 08/10/2016 0428   BUN <5 (L) 08/10/2016 0428   CREATININE 0.32 (L) 08/10/2016 0428   CREATININE 0.41 (L) 09/06/2015 1635   CALCIUM 7.8 (L) 08/10/2016 0428   PROT 5.2 (L) 08/08/2016 1920   ALBUMIN 2.8 (L) 08/08/2016 1920   AST 24 08/08/2016 1920   ALT 29 08/08/2016 1920   ALKPHOS 27 (L) 08/08/2016 1920   BILITOT 0.9 08/08/2016 1920   GFRNONAA >60 08/10/2016 0428   GFRNONAA >89 09/06/2015 1635   GFRAA >60 08/10/2016 0428   GFRAA >89 09/06/2015 1635   Assessment:  1.  Fecal impaction.  No significant improvement with Miralax prep.  No response from tap water enema and mineral oil enema yesterday. 2.  Quadriplegia. 3.  Nausea and vomiting. 4.  Abnormal CT scan abdomen, diffuse fecal impaction with subtotal colonic obstruction.    Afebrile.  No leukocytosis.  Plan:  1.  Clear liquid diet only. 2.  Trial of dulcolax suppositories, 10 mg PR Q 8 hours.  Patient reluctant to do more enemas (SMOG enemas are a consideration). 3.  If no better within next 24-48 hours, might have to consider sigmoidoscopy with fecal disruption, but in my experience this procedure rarely helps and frequently requires surgical disimpaction if medically refractory. 4.  [facility] will follow.   Freddy JakschOUTLAW,Deserae Jennings M 08/11/2016, 3:39 PM   Pager [phone] If no answer or after 5 PM call [phone]

## 2016-08-11 NOTE — Progress Notes (Signed)
PROGRESS NOTE  Nancy [name]  ZOX:[id number] DOB: 1991-01-09 DOA: 08/08/2016 PCP: Leo Grosser, MD  Outpatient Specialists: Dr. Thereasa Solo - [facility]  Brief Narrative: Nancy [name] is a 25 y.o. female with a history of CP, mild MR, mood disorder, and seizures who presented 12/14 for emesis, loose stools and diffuse abdominal pain. without hematochezia or melena. On arrival she was tachycardic and normotensive. CT abdomen findings of fecal impaction and resulting bowel obstruction. She has had some loose stools since admission but unsuccessful disimpaction and minimal stool output with tap water and then mineral oil enemas. GI was consulted, recommending miralax cleanout which has been unsuccessful, repeat abd XR 12/16 showing persistent impaction. Plan to repeat enema today x2 if needed and proceed to GoLytely bowel prep if no resolution of impaction.   Assessment & Plan: Principal Problem:   Nausea vomiting and diarrhea Active Problems:   Congenital quadriplegia (HCC)   Paranoia (HCC)   SIRS (systemic inflammatory response syndrome) (HCC)   Fecal impaction (HCC)  Fecal impaction: Continues despite overflow encopresis with multiple stools/enemas as evidenced on follow up abdominal radiography. Attempted disimpaction at admission. Not able to palpate impaction on rectal exam now.  - Attempted miralax bowel prep 12/15 with continued impaction on exam and XR. - Will continue enemas and proceed to GoLytely bowel prep tomorrow if unsuccessful. - Appreciate GI assistance. Flex sig to be considered if enemas/GoLytely unsuccessful.   Noroviral gastroenteritis: CDiff panel showed CDiff without toxin and no PCR was able to be performed. Now menstruating and incontinent with multiple laxatives/enemas limiting yield of recollection.  - Because norovirus alone would explain these findings, will hold empiric CDiff treatment. Were a leukocytosis, fever, or worsening of  abdominal pain to occur, would start empiric vancomycin po.  - Continue IVF's, continue full liquid diet with antiemetics - BMP in AM to monitor electrolytes.   Menorrhagia with normal cycle: Chronic, currently menstruating. IUD seen on CT in correct position.  - Drop in hgb noted (16.8 > 12.3), stable at 12.7 on recheck. suspect significant hemoconcentration at admission.  Distended urinary bladder seen on CT. Resolved. Bladder scan showed 20cc indicating no retention.  - Monitor UOP.  Anxiety/history of paranoia: Chronic and stable.  - Continue home klonopin and abilify  DVT prophylaxis: Heparin Code Status: Full code Family Communication: Father at bedside Disposition Plan: Inpatient management. Anticipate eventual home disposition.  Consultants:   [facility], Dr. Bosie Clos  Procedures:   None  Antimicrobials:  Flagyl x1 12/14   Subjective: Pt still with nausea and abdominal pain. No significant improvement. Had single small volume liquid stool this morning since miralax prep.   Objective: Vitals:   08/10/16 0450 08/10/16 1452 08/10/16 2128 08/11/16 0552  BP: (!) 106/42 114/62 (!) 107/59 107/68  Pulse: 85 92 92 91  Resp: 18 18 16  (!) 49  Temp: 98.7 F (37.1 C) 98.4 F (36.9 C) 98.5 F (36.9 C) 98 F (36.7 C)  TempSrc: Oral Oral Oral Oral  SpO2: 90% 94% 99% 100%  Weight:      Height:        Intake/Output Summary (Last 24 hours) at 08/11/16 1400 Last data filed at 08/11/16 0200  Gross per 24 hour  Intake             2120 ml  Output                0 ml  Net  2120 ml   Filed Weights   08/08/16 1802 08/09/16 0445  Weight: 52.2 kg (115 lb) 45.6 kg (100 lb 8 oz)    Examination: General exam: [age] y.o. female in no distress Respiratory system: Non-labored breathing room air. Clear to auscultation bilaterally.  Cardiovascular system: Regular rate and rhythm. No murmur, rub, or gallop. No JVD, and no pedal edema. Gastrointestinal system: Abdomen soft,  diffusely tender with guarding but no rebound, non-distended, with hypoactive bowel sounds. No palpable impaction on DRE on exam 12/15.  GU: Limited red blood from vagina.  Central nervous system: Alert and oriented. Spastic global weakness. UE's 4/5, LE's 3/5 strength.  MSK: Warm extremities, severe scoliosis. Skin: No rashes, lesions No ulcers Psychiatry: Judgement and insight appear normal. Mood & affect appropriate. Cognition below average but able to comprehend information.  Data Reviewed: I have personally reviewed following labs and imaging studies  CBC:  Recent Labs Lab 08/08/16 1844 08/10/16 0428 08/11/16 0502  WBC 23.9* 8.9 6.5  HGB 16.8* 12.3 12.7  HCT 49.0* 35.7* 36.9  MCV 94.4 92.5 92.0  PLT 386 248 304   Basic Metabolic Panel:  Recent Labs Lab 08/08/16 1920 08/10/16 0428  NA 140 136  K 3.5 3.7  CL 114* 110  CO2 20* 20*  GLUCOSE 112* 78  BUN 11 <5*  CREATININE <0.30* 0.32*  CALCIUM 6.8* 7.8*   GFR: Estimated Creatinine Clearance: 76.7 mL/min (by C-G formula based on SCr of 0.32 mg/dL (L)). Liver Function Tests:  Recent Labs Lab 08/08/16 1920  AST 24  ALT 29  ALKPHOS 27*  BILITOT 0.9  PROT 5.2*  ALBUMIN 2.8*    Recent Labs Lab 08/08/16 1920  LIPASE 29   No results for input(s): AMMONIA in the last 168 hours. Coagulation Profile:  Recent Labs Lab 08/09/16 0425  INR 1.30   Cardiac Enzymes: No results for input(s): CKTOTAL, CKMB, CKMBINDEX, TROPONINI in the last 168 hours. BNP (last 3 results) No results for input(s): PROBNP in the last 8760 hours. HbA1C: No results for input(s): HGBA1C in the last 72 hours. CBG: No results for input(s): GLUCAP in the last 168 hours. Lipid Profile: No results for input(s): CHOL, HDL, LDLCALC, TRIG, CHOLHDL, LDLDIRECT in the last 72 hours. Thyroid Function Tests: No results for input(s): TSH, T4TOTAL, FREET4, T3FREE, THYROIDAB in the last 72 hours. Anemia Panel: No results for input(s): VITAMINB12,  FOLATE, FERRITIN, TIBC, IRON, RETICCTPCT in the last 72 hours. Urine analysis:    Component Value Date/Time   COLORURINE AMBER (A) 08/08/2016 2304   APPEARANCEUR CLEAR 08/08/2016 2304   LABSPEC 1.029 08/08/2016 2304   PHURINE 5.0 08/08/2016 2304   GLUCOSEU NEGATIVE 08/08/2016 2304   HGBUR NEGATIVE 08/08/2016 2304   BILIRUBINUR SMALL (A) 08/08/2016 2304   KETONESUR 20 (A) 08/08/2016 2304   PROTEINUR NEGATIVE 08/08/2016 2304   UROBILINOGEN 0.2 12/03/2013 2242   NITRITE NEGATIVE 08/08/2016 2304   LEUKOCYTESUR NEGATIVE 08/08/2016 2304   Sepsis Labs: @LABRCNTIP (procalcitonin:4,lacticidven:4)  ) Recent Results (from the past 240 hour(s))  Culture, blood (x 2)     Status: None (Preliminary result)   Collection Time: 08/08/16  6:45 PM  Result Value Ref Range Status   Specimen Description BLOOD LEFT ANTECUBITAL  Final   Special Requests BOTTLES DRAWN AEROBIC ONLY  6CC  Final   Culture   Final    NO GROWTH 1 DAY [facility]    Report Status PENDING  Incomplete  Culture, blood (x 2)     Status:  None (Preliminary result)   Collection Time: 08/09/16  4:24 AM  Result Value Ref Range Status   Specimen Description BLOOD RIGHT WRIST  Final   Special Requests BOTTLES DRAWN AEROBIC AND ANAEROBIC 5CC  Final   Culture   Final    NO GROWTH 1 DAY Performed at [facility]    Report Status PENDING  Incomplete  C difficile quick screen w PCR reflex     Status: Abnormal   Collection Time: 08/09/16  1:41 PM  Result Value Ref Range Status   C Diff antigen POSITIVE (A) NEGATIVE Final   C Diff toxin NEGATIVE NEGATIVE Final   C Diff interpretation Results are indeterminate. See PCR results.  Final  Gastrointestinal Panel by PCR , Stool     Status: Abnormal   Collection Time: 08/09/16  1:41 PM  Result Value Ref Range Status   Campylobacter species NOT DETECTED NOT DETECTED Final   Plesimonas shigelloides NOT DETECTED NOT DETECTED Final   Salmonella species NOT  DETECTED NOT DETECTED Final   Yersinia enterocolitica NOT DETECTED NOT DETECTED Final   Vibrio species NOT DETECTED NOT DETECTED Final   Vibrio cholerae NOT DETECTED NOT DETECTED Final   Enteroaggregative E coli (EAEC) NOT DETECTED NOT DETECTED Final   Enteropathogenic E coli (EPEC) NOT DETECTED NOT DETECTED Final   Enterotoxigenic E coli (ETEC) NOT DETECTED NOT DETECTED Final   Shiga like toxin producing E coli (STEC) NOT DETECTED NOT DETECTED Final   E. coli O157 NOT DETECTED NOT DETECTED Final   Shigella/Enteroinvasive E coli (EIEC) NOT DETECTED NOT DETECTED Final   Cryptosporidium NOT DETECTED NOT DETECTED Final   Cyclospora cayetanensis NOT DETECTED NOT DETECTED Final   Entamoeba histolytica NOT DETECTED NOT DETECTED Final   Giardia lamblia NOT DETECTED NOT DETECTED Final   Adenovirus F40/41 NOT DETECTED NOT DETECTED Final   Astrovirus NOT DETECTED NOT DETECTED Final   Norovirus GI/GII DETECTED (A) NOT DETECTED Final    Comment: SPOKE WITH MARIA DELEON RN AT 0411 08/10/16 SDR   Rotavirus A NOT DETECTED NOT DETECTED Final   Sapovirus (I, II, IV, and V) NOT DETECTED NOT DETECTED Final  Clostridium Difficile by PCR     Status: Abnormal   Collection Time: 08/09/16  1:41 PM  Result Value Ref Range Status   Toxigenic C Difficile by pcr QUANTITY NOT SUFFICIENT TO REPEAT TEST (A) NEGATIVE Final    CommentByrd Hesselbach, RN AWARE AT 2200 08/09/16 SCOTTEN,M SEE RECOLLECT [id number]      Radiology Studies: Dg Abd 1 View  Result Date: 08/11/2016 CLINICAL DATA:  Abdominal pain.  Fecal impaction.  Cerebral palsy. EXAM: ABDOMEN - 1 VIEW COMPARISON:  08/10/2016. FINDINGS: Normal caliber gas distended loops of bowel throughout the abdomen. Large amount of stool in the rectum. Marked dextroconvex thoracolumbar rotary scoliosis without significant change. IMPRESSION: Large amount of stool in the rectum, compatible with fecal impaction. Electronically Signed   By: Beckie Salts M.D.   On: 08/11/2016 12:42    Dg Abd 1 View  Result Date: 08/10/2016 CLINICAL DATA:  Fecal impaction EXAM: ABDOMEN - 1 VIEW COMPARISON:  Abdominal CT scan of August 09, 2016 FINDINGS: There is a large amount of stool in the rectum. Proximal to this the colon and portions of the small bowel exhibit moderate. No free extraluminal gas is observed. There is severe lumbar scoliosis convex toward the right with a rotatory component. Gaseous distention. IMPRESSION: Fecal impaction. Moderate gaseous distention of bowel proximal to the rectum. Electronically Signed  By: David  Swazil[place]Jordan M.D.   On: 08/10/2016 07:06    Scheduled Meds: . ARIPiprazole  2 mg Oral Daily  . clonazePAM  1 mg Oral q morning - 10a  . heparin  5,000 Units Subcutaneous Q8H  . sodium chloride flush  3 mL Intravenous Q12H   Continuous Infusions: . sodium chloride 100 mL/hr at 08/11/16 0234     LOS: 2 days   Time spent: 25 minutes.  Hazeline Junkeryan Teresita Fanton, MD [facility] Pager [phone]  If 7PM-7AM, please contact night-coverage [email or web address] Password [email or web address] 08/11/2016, 2:00 PM

## 2016-08-12 LAB — BASIC METABOLIC PANEL
ANION GAP: 7 (ref 5–15)
BUN: <5 mg/dL — ABNORMAL LOW (ref 6–20)
CALCIUM: 8.4 mg/dL — AB (ref 8.9–10.3)
CO2: 22 mmol/L (ref 22–32)
Chloride: 108 mmol/L (ref 101–111)
Creatinine, Ser: <0.3 mg/dL — ABNORMAL LOW (ref 0.44–1.00)
Glucose, Bld: 99 mg/dL (ref 65–99)
Potassium: 3.2 mmol/L — ABNORMAL LOW (ref 3.5–5.1)
Sodium: 137 mmol/L (ref 135–145)

## 2016-08-12 MED ORDER — POLYETHYLENE GLYCOL 3350 17 G PO PACK
17.0000 g | PACK | Freq: Three times a day (TID) | ORAL | Status: DC
  Administered 2016-08-12 – 2016-08-13 (×5): 17 g via ORAL
  Filled 2016-08-12 (×5): qty 1

## 2016-08-12 MED ORDER — POTASSIUM CHLORIDE CRYS ER 20 MEQ PO TBCR
40.0000 meq | EXTENDED_RELEASE_TABLET | Freq: Once | ORAL | Status: AC
  Administered 2016-08-12: 40 meq via ORAL
  Filled 2016-08-12: qty 2

## 2016-08-12 NOTE — Progress Notes (Addendum)
PROGRESS NOTE  Nancy PoeBrittany N Urwin  WUJ:[id number] DOB: [birth year] DOA: 08/08/2016 PCP: Leo GrosserPICKARD,WARREN TOM, MD  Outpatient Specialists: Dr. Thereasa SoloWilliam Richardson - [facility]  Brief Narrative: Nancy [name] is a 25 y.o. female with a history of CP, mild MR, mood disorder, and seizures who presented 12/14 for emesis, loose stools and diffuse abdominal pain. without hematochezia or melena. On arrival she was tachycardic and normotensive. CT abdomen findings of fecal impaction and resulting bowel obstruction. She has had some loose stools since admission but unsuccessful disimpaction and minimal stool output with tap water and then mineral oil enemas. GI was consulted, recommending miralax cleanout which has been unsuccessful, repeat abd XR 12/16 showing persistent impaction. Has had some progress with large bowel movements following suppositories. Plan to continue suppositories and miralax.  Assessment & Plan: Principal Problem:   Nausea vomiting and diarrhea Active Problems:   Congenital quadriplegia (HCC)   Paranoia (HCC)   SIRS (systemic inflammatory response syndrome) (HCC)   Fecal impaction (HCC)  Fecal impaction: Continues despite overflow encopresis with multiple stools/enemas as evidenced on follow up abdominal radiography. Attempted disimpaction at admission. Not able to palpate impaction on rectal exam now.  - Attempted miralax bowel prep 12/15 with continued impaction on exam and XR. Some stool return 12/17 with use of suppositories, will continue these + miralax TID - Appreciate GI assistance. Flex sig to be considered if enemas/GoLytely unsuccessful.  - Full liquid diet  Noroviral gastroenteritis: CDiff panel showed CDiff without toxin and no PCR was able to be performed. Now menstruating and incontinent with multiple laxatives/enemas limiting yield of recollection. Because norovirus alone would explain these findings, will hold empiric CDiff treatment. Were leukocytosis,  fever, or worsening of abdominal pain to occur, would start empiric vancomycin po.  - Continue IVF's, continue full liquid diet with antiemetics - BMP in AM to monitor electrolytes.  - Enteric precautions  Hypokalemia: Due to limited po/diarrhea.  - Replete and recheck.   Menorrhagia with normal cycle: Chronic, currently menstruating. IUD seen on CT in correct position.  - Drop in hgb noted (16.8 > 12.3), stable at 12.7 on recheck. suspect significant hemoconcentration at admission.  Distended urinary bladder seen on CT. Resolved. Bladder scan showed 20cc indicating no retention.  - Monitor UOP.  Anxiety/history of paranoia: Chronic and stable.  - Continue home klonopin and abilify  DVT prophylaxis: Heparin Code Status: Full code Family Communication: Father at bedside Disposition Plan: Inpatient management. Anticipate eventual home disposition.  [facility]ants[facility]   [facility], Dr. Bosie ClosSchooler, Dr. Dulce Sellarutlaw  Procedures:   None  Antimicrobials:  Flagyl x1 12/14   Subjective: Pt still with nausea and abdominal pain. No significant improvement. Had a large, solid BM overnight and some liquid stool this morning.   Objective: Vitals:   08/11/16 0622 08/11/16 2044 08/12/16 0351 08/12/16 1347  BP:  (!) 134/52 105/71 97/64  Pulse:  (!) 113 82 79  Resp: 20 20 20 18   Temp:   98.2 F (36.8 C) 98.3 F (36.8 C)  TempSrc:   Oral Oral  SpO2:  98% 100% 100%  Weight:      Height:        Intake/Output Summary (Last 24 hours) at 08/12/16 1511 Last data filed at 08/12/16 0200  Gross per 24 hour  Intake             2620 ml  Output                0 ml  Net  2620 ml   Filed Weights   08/08/16 1802 08/09/16 0445  Weight: 52.2 kg (115 lb) 45.6 kg (100 lb 8 oz)    Examination: General exam: 25 y.o. female in no distress Respiratory system: Non-labored breathing room air. Clear to auscultation bilaterally.  Cardiovascular system: Regular rate and rhythm. No murmur, rub, or  gallop. No JVD, and no pedal edema. Gastrointestinal system: Abdomen soft, diffusely tender with guarding but no rebound, non-distended, with hypoactive bowel sounds. No palpable impaction on DRE on exam 12/15.  Central nervous system: Alert and oriented. Spastic global weakness. UE's 4/5, LE's 3/5 strength.  MSK: Warm extremities, severe scoliosis. Skin: No rashes, lesions No ulcers Psychiatry: Judgement and insight appear normal. Mood & affect appropriate. Cognition below average but able to comprehend information.  Data Reviewed: I have personally reviewed following labs and imaging studies  CBC:  Recent Labs Lab 08/08/16 1844 08/10/16 0428 08/11/16 0502  WBC 23.9* 8.9 6.5  HGB 16.8* 12.3 12.7  HCT 49.0* 35.7* 36.9  MCV 94.4 92.5 92.0  PLT 386 248 304   Basic Metabolic Panel:  Recent Labs Lab 08/08/16 1920 08/10/16 0428 08/12/16 0705  NA 140 136 137  K 3.5 3.7 3.2*  CL 114* 110 108  CO2 20* 20* 22  GLUCOSE 112* 78 99  BUN 11 <5* <5*  CREATININE <0.30* 0.32* <0.30*  CALCIUM 6.8* 7.8* 8.4*   GFR: CrCl cannot be calculated (This lab value cannot be used to calculate CrCl because it is not a number: <0.30). Liver Function Tests:  Recent Labs Lab 08/08/16 1920  AST 24  ALT 29  ALKPHOS 27*  BILITOT 0.9  PROT 5.2*  ALBUMIN 2.8*    Recent Labs Lab 08/08/16 1920  LIPASE 29   No results for input(s): AMMONIA in the last 168 hours. Coagulation Profile:  Recent Labs Lab 08/09/16 0425  INR 1.30   Cardiac Enzymes: No results for input(s): CKTOTAL, CKMB, CKMBINDEX, TROPONINI in the last 168 hours. BNP (last 3 results) No results for input(s): PROBNP in the last 8760 hours. HbA1C: No results for input(s): HGBA1C in the last 72 hours. CBG: No results for input(s): GLUCAP in the last 168 hours. Lipid Profile: No results for input(s): CHOL, HDL, LDLCALC, TRIG, CHOLHDL, LDLDIRECT in the last 72 hours. Thyroid Function Tests: No results for input(s): TSH,  T4TOTAL, FREET4, T3FREE, THYROIDAB in the last 72 hours. Anemia Panel: No results for input(s): VITAMINB12, FOLATE, FERRITIN, TIBC, IRON, RETICCTPCT in the last 72 hours. Urine analysis:    Component Value Date/Time   COLORURINE AMBER (A) 08/08/2016 2304   APPEARANCEUR CLEAR 08/08/2016 2304   LABSPEC 1.029 08/08/2016 2304   PHURINE 5.0 08/08/2016 2304   GLUCOSEU NEGATIVE 08/08/2016 2304   HGBUR NEGATIVE 08/08/2016 2304   BILIRUBINUR SMALL (A) 08/08/2016 2304   KETONESUR 20 (A) 08/08/2016 2304   PROTEINUR NEGATIVE 08/08/2016 2304   UROBILINOGEN 0.2 12/03/2013 2242   NITRITE NEGATIVE 08/08/2016 2304   LEUKOCYTESUR NEGATIVE 08/08/2016 2304   Sepsis Labs: @LABRCNTIP (procalcitonin:4,lacticidven:4)  ) Recent Results (from the past 240 hour(s))  Culture, blood (x 2)     Status: None (Preliminary result)   Collection Time: 08/08/16  6:45 PM  Result Value Ref Range Status   Specimen Description BLOOD LEFT ANTECUBITAL  Final   Special Requests BOTTLES DRAWN AEROBIC ONLY  6CC  Final   Culture   Final    NO GROWTH 3 DAYS [facility]    Report Status PENDING  Incomplete  Culture,  blood (x 2)     Status: None (Preliminary result)   Collection Time: 08/09/16  4:24 AM  Result Value Ref Range Status   Specimen Description BLOOD RIGHT WRIST  Final   Special Requests BOTTLES DRAWN AEROBIC AND ANAEROBIC 5CC  Final   Culture   Final    NO GROWTH 3 DAYS Performed at [facility]    Report Status PENDING  Incomplete  C difficile quick screen w PCR reflex     Status: Abnormal   Collection Time: 08/09/16  1:41 PM  Result Value Ref Range Status   C Diff antigen POSITIVE (A) NEGATIVE Final   C Diff toxin NEGATIVE NEGATIVE Final   C Diff interpretation Results are indeterminate. See PCR results.  Final  Gastrointestinal Panel by PCR , Stool     Status: Abnormal   Collection Time: 08/09/16  1:41 PM  Result Value Ref Range Status   Campylobacter species NOT DETECTED  NOT DETECTED Final   Plesimonas shigelloides NOT DETECTED NOT DETECTED Final   Salmonella species NOT DETECTED NOT DETECTED Final   Yersinia enterocolitica NOT DETECTED NOT DETECTED Final   Vibrio species NOT DETECTED NOT DETECTED Final   Vibrio cholerae NOT DETECTED NOT DETECTED Final   Enteroaggregative E coli (EAEC) NOT DETECTED NOT DETECTED Final   Enteropathogenic E coli (EPEC) NOT DETECTED NOT DETECTED Final   Enterotoxigenic E coli (ETEC) NOT DETECTED NOT DETECTED Final   Shiga like toxin producing E coli (STEC) NOT DETECTED NOT DETECTED Final   E. coli O157 NOT DETECTED NOT DETECTED Final   Shigella/Enteroinvasive E coli (EIEC) NOT DETECTED NOT DETECTED Final   Cryptosporidium NOT DETECTED NOT DETECTED Final   Cyclospora cayetanensis NOT DETECTED NOT DETECTED Final   Entamoeba histolytica NOT DETECTED NOT DETECTED Final   Giardia lamblia NOT DETECTED NOT DETECTED Final   Adenovirus F40/41 NOT DETECTED NOT DETECTED Final   Astrovirus NOT DETECTED NOT DETECTED Final   Norovirus GI/GII DETECTED (A) NOT DETECTED Final    Comment: SPOKE WITH MARIA DELEON RN AT 0411 08/10/16 SDR   Rotavirus A NOT DETECTED NOT DETECTED Final   Sapovirus (I, II, IV, and V) NOT DETECTED NOT DETECTED Final  Clostridium Difficile by PCR     Status: Abnormal   Collection Time: 08/09/16  1:41 PM  Result Value Ref Range Status   Toxigenic C Difficile by pcr QUANTITY NOT SUFFICIENT TO REPEAT TEST (A) NEGATIVE Final    CommentByrd Hesselbach, RN AWARE AT 2200 08/09/16 SCOTTEN,M SEE RECOLLECT [id number]      Radiology Studies: Dg Abd 1 View  Result Date: 08/11/2016 CLINICAL DATA:  Abdominal pain.  Fecal impaction.  Cerebral palsy. EXAM: ABDOMEN - 1 VIEW COMPARISON:  08/10/2016. FINDINGS: Normal caliber gas distended loops of bowel throughout the abdomen. Large amount of stool in the rectum. Marked dextroconvex thoracolumbar rotary scoliosis without significant change. IMPRESSION: Large amount of stool in the rectum,  compatible with fecal impaction. Electronically Signed   By: Beckie Salts M.D.   On: 08/11/2016 12:42    Scheduled Meds: . ARIPiprazole  2 mg Oral Daily  . bisacodyl  10 mg Rectal TID  . clonazePAM  1 mg Oral q morning - 10a  . heparin  5,000 Units Subcutaneous Q8H  . polyethylene glycol  17 g Oral TID  . sodium chloride flush  3 mL Intravenous Q12H   Continuous Infusions: . sodium chloride 100 mL/hr at 08/12/16 1258     LOS: 3 days   Time spent: 25  minutes.  Hazeline Junker, MD [facility] Pager [phone]  If 7PM-7AM, please contact night-coverage [email or web address] Password [email or web address] 08/12/2016, 3:11 PM

## 2016-08-12 NOTE — Progress Notes (Signed)
Subjective: Two large bowel movements, and one smaller bowel movement, over the past 24 hours.  Objective: Vital signs in last 24 hours: Temp:  [98.2 F (36.8 C)-98.3 F (36.8 C)] 98.3 F (36.8 C) (12/17 1347) Pulse Rate:  [79-113] 79 (12/17 1347) Resp:  [18-20] 18 (12/17 1347) BP: (97-134)/(52-71) 97/64 (12/17 1347) SpO2:  [98 %-100 %] 100 % (12/17 1347) Weight change:  Last BM Date: 08/11/16  PE: GEN:  Contractured, pressured speech, quadriplegic ABD:  Mild-to-moderate distention (no significant change since yesterday), mild generalized tenderness without peritonitis  Lab Results: CBC    Component Value Date/Time   WBC 6.5 08/11/2016 0502   RBC 4.01 08/11/2016 0502   HGB 12.7 08/11/2016 0502   HCT 36.9 08/11/2016 0502   PLT 304 08/11/2016 0502   MCV 92.0 08/11/2016 0502   MCH 31.7 08/11/2016 0502   MCHC 34.4 08/11/2016 0502   RDW 13.1 08/11/2016 0502   LYMPHSABS 2.5 09/06/2015 1635   MONOABS 0.4 09/06/2015 1635   EOSABS 0.1 09/06/2015 1635   BASOSABS 0.0 09/06/2015 1635   CMP     Component Value Date/Time   NA 137 08/12/2016 0705   K 3.2 (L) 08/12/2016 0705   CL 108 08/12/2016 0705   CO2 22 08/12/2016 0705   GLUCOSE 99 08/12/2016 0705   BUN <5 (L) 08/12/2016 0705   CREATININE <0.30 (L) 08/12/2016 0705   CREATININE 0.41 (L) 09/06/2015 1635   CALCIUM 8.4 (L) 08/12/2016 0705   PROT 5.2 (L) 08/08/2016 1920   ALBUMIN 2.8 (L) 08/08/2016 1920   AST 24 08/08/2016 1920   ALT 29 08/08/2016 1920   ALKPHOS 27 (L) 08/08/2016 1920   BILITOT 0.9 08/08/2016 1920   GFRNONAA NOT CALCULATED 08/12/2016 0705   GFRNONAA >89 09/06/2015 1635   GFRAA NOT CALCULATED 08/12/2016 0705   GFRAA >89 09/06/2015 1635   Assessment:  1.  Fecal impaction. Two large bowel movements, and one smaller bowel movement, over the past 24 hours. 2.  Quadriplegia. 3.  Nausea and vomiting. 4.  Abnormal CT scan abdomen, diffuse fecal impaction with subtotal colonic obstruction.   Afebrile.  No  leukocytosis.  Plan:  1.  Full liquid diet today (not ready for more solid food until she has several more large stools). 2.  Miralax TID and bisacodyl suppositories TID. 3.  If more stool output over the next 24 hours, might pursue more peroral bowel purgatives (Moviprep). 4.  Since she's having stool output, will hold off on sigmoidoscopy for the time-being. 5.  [facility] will follow.   Nancy [name],Nancy [name] 08/12/2016, 2:52 PM   Pager [phone] If no answer or after 5 PM call [phone]

## 2016-08-13 ENCOUNTER — Inpatient Hospital Stay (HOSPITAL_COMMUNITY): Payer: Medicare Other

## 2016-08-13 DIAGNOSIS — R112 Nausea with vomiting, unspecified: Secondary | ICD-10-CM

## 2016-08-13 DIAGNOSIS — R197 Diarrhea, unspecified: Secondary | ICD-10-CM

## 2016-08-13 LAB — BASIC METABOLIC PANEL
ANION GAP: 5 (ref 5–15)
CHLORIDE: 108 mmol/L (ref 101–111)
CO2: 24 mmol/L (ref 22–32)
Calcium: 8.7 mg/dL — ABNORMAL LOW (ref 8.9–10.3)
Creatinine, Ser: 0.35 mg/dL — ABNORMAL LOW (ref 0.44–1.00)
GFR calc Af Amer: >60 mL/min (ref >60)
GFR calc non Af Amer: >60 mL/min (ref >60)
GLUCOSE: 112 mg/dL — AB (ref 65–99)
POTASSIUM: 3.5 mmol/L (ref 3.5–5.1)
Sodium: 137 mmol/L (ref 135–145)

## 2016-08-13 NOTE — Care Management Important Message (Signed)
Important Message  Patient Details  Name: Ewell PoeBrittany N Pendergrass MRN: [id number] Date of Birth: [birth year]   Medicare Important Message Given:  Yes    Caren MacadamFuller, Merrit Friesen 08/13/2016, 1:57 PMImportant Message  Patient Details  Name: Ewell PoeBrittany N Kempker MRN: [id number] Date of Birth: [birth year]   Medicare Important Message Given:  Yes    Caren MacadamFuller, Arretta Toenjes 08/13/2016, 1:57 PM

## 2016-08-13 NOTE — Progress Notes (Signed)
PROGRESS NOTE  Nancy PoeBrittany N Schweiss  ZOX:[id number] DOB: 1991-05-29 DOA: 08/08/2016 PCP: Leo GrosserPICKARD,WARREN TOM, MD  Outpatient Specialists: Dr. Thereasa SoloWilliam Richardson - [facility]  Brief Narrative: Nancy [name] is a 25 y.o. female with a history of CP, mild MR, mood disorder, and seizures who presented 12/14 for emesis, loose stools and diffuse abdominal pain. without hematochezia or melena. On arrival she was tachycardic and normotensive. CT abdomen findings of fecal impaction and resulting bowel obstruction. She has had some loose stools since admission but unsuccessful disimpaction and minimal stool output with tap water and then mineral oil enemas. GI was consulted, recommending miralax cleanout which has been unsuccessful, repeat abd XR 12/16 showing persistent impaction. Has had some progress with large bowel movements following suppositories. Plan to continue suppositories and miralax.  Assessment & Plan: Principal Problem:   Nausea vomiting and diarrhea Active Problems:   Congenital quadriplegia (HCC)   Paranoia (HCC)   SIRS (systemic inflammatory response syndrome) (HCC)   Fecal impaction (HCC)  Fecal impaction:  - GI on board and assisting. - f/u abdominal x ray reports no bowel obstruction  Noroviral gastroenteritis: CDiff panel showed CDiff without toxin and no PCR was able to be performed. Now menstruating and incontinent with multiple laxatives/enemas limiting yield of recollection. Because norovirus alone would explain these findings, will hold empiric CDiff treatment. Were leukocytosis, fever, or worsening of abdominal pain to occur, would start empiric vancomycin po.  - Enteric precautions - continue supportive therapy  Hypokalemia: Due to limited po/diarrhea.  - Replete and recheck.   Menorrhagia with normal cycle: Chronic, currently menstruating. IUD seen on CT in correct position.  - Drop in hgb noted (16.8 > 12.3), stable at 12.7 on recheck. suspect  significant hemoconcentration at admission.  Distended urinary bladder seen on CT. Resolved. Bladder scan showed 20cc indicating no retention.  - Monitor UOP.  Anxiety/history of paranoia: Chronic and stable.  - Continue home klonopin and abilify  DVT prophylaxis: Heparin Code Status: Full code Family Communication: Father at bedside Disposition Plan: Inpatient management. Anticipate eventual home disposition.  [facility]ants:   [facility], Dr. Bosie ClosSchooler, Dr. Dulce Sellarutlaw  Procedures:   None  Antimicrobials:  Flagyl x1 12/14   Subjective: Pt has no new complaints for me.  Objective: Vitals:   08/12/16 1347 08/12/16 2134 08/13/16 0629 08/13/16 1500  BP: 97/64 114/63 110/76 105/65  Pulse: 79 76 97 96  Resp: 18 16 12 16   Temp: 98.3 F (36.8 C) 97.7 F (36.5 C) 98.1 F (36.7 C) 98.5 F (36.9 C)  TempSrc: Oral Oral Oral Oral  SpO2: 100% 97% 99% 100%  Weight:      Height:        Intake/Output Summary (Last 24 hours) at 08/13/16 1752 Last data filed at 08/13/16 0200  Gross per 24 hour  Intake              700 ml  Output                0 ml  Net              700 ml   Filed Weights   08/08/16 1802 08/09/16 0445  Weight: 52.2 kg (115 lb) 45.6 kg (100 lb 8 oz)    Examination: General exam: 25 y.o. female , in nad. Respiratory system: Non-labored breathing room air. Clear to auscultation bilaterally.  Cardiovascular system: Regular rate and rhythm. No murmur, rub, or gallop. No JVD, and no pedal edema. Gastrointestinal system: Abdomen soft, diffusely  tender with guarding but no rebound, non-distended, with hypoactive bowel sounds. No palpable impaction on DRE on exam 12/15.  Central nervous system: Alert and oriented. Spastic global weakness. UE's 4/5, LE's 3/5 strength.  MSK: Warm extremities, severe scoliosis. Skin: No rashes, lesions No ulcers Psychiatry: Judgement and insight appear normal. Mood & affect appropriate. Cognition below average but able to comprehend  information.  Data Reviewed: I have personally reviewed following labs and imaging studies  CBC:  Recent Labs Lab 08/08/16 1844 08/10/16 0428 08/11/16 0502  WBC 23.9* 8.9 6.5  HGB 16.8* 12.3 12.7  HCT 49.0* 35.7* 36.9  MCV 94.4 92.5 92.0  PLT 386 248 304   Basic Metabolic Panel:  Recent Labs Lab 08/08/16 1920 08/10/16 0428 08/12/16 0705 08/13/16 0427  NA 140 136 137 137  K 3.5 3.7 3.2* 3.5  CL 114* 110 108 108  CO2 20* 20* 22 24  GLUCOSE 112* 78 99 112*  BUN 11 <5* <5* <5*  CREATININE <0.30* 0.32* <0.30* 0.35*  CALCIUM 6.8* 7.8* 8.4* 8.7*   GFR: Estimated Creatinine Clearance: 76.7 mL/min (by C-G formula based on SCr of 0.35 mg/dL (L)). Liver Function Tests:  Recent Labs Lab 08/08/16 1920  AST 24  ALT 29  ALKPHOS 27*  BILITOT 0.9  PROT 5.2*  ALBUMIN 2.8*    Recent Labs Lab 08/08/16 1920  LIPASE 29   No results for input(s): AMMONIA in the last 168 hours. Coagulation Profile:  Recent Labs Lab 08/09/16 0425  INR 1.30   Cardiac Enzymes: No results for input(s): CKTOTAL, CKMB, CKMBINDEX, TROPONINI in the last 168 hours. BNP (last 3 results) No results for input(s): PROBNP in the last 8760 hours. HbA1C: No results for input(s): HGBA1C in the last 72 hours. CBG: No results for input(s): GLUCAP in the last 168 hours. Lipid Profile: No results for input(s): CHOL, HDL, LDLCALC, TRIG, CHOLHDL, LDLDIRECT in the last 72 hours. Thyroid Function Tests: No results for input(s): TSH, T4TOTAL, FREET4, T3FREE, THYROIDAB in the last 72 hours. Anemia Panel: No results for input(s): VITAMINB12, FOLATE, FERRITIN, TIBC, IRON, RETICCTPCT in the last 72 hours. Urine analysis:    Component Value Date/Time   COLORURINE AMBER (A) 08/08/2016 2304   APPEARANCEUR CLEAR 08/08/2016 2304   LABSPEC 1.029 08/08/2016 2304   PHURINE 5.0 08/08/2016 2304   GLUCOSEU NEGATIVE 08/08/2016 2304   HGBUR NEGATIVE 08/08/2016 2304   BILIRUBINUR SMALL (A) 08/08/2016 2304    KETONESUR 20 (A) 08/08/2016 2304   PROTEINUR NEGATIVE 08/08/2016 2304   UROBILINOGEN 0.2 12/03/2013 2242   NITRITE NEGATIVE 08/08/2016 2304   LEUKOCYTESUR NEGATIVE 08/08/2016 2304   Sepsis Labs: @LABRCNTIP (procalcitonin:4,lacticidven:4)  ) Recent Results (from the past 240 hour(s))  Culture, blood (x 2)     Status: None (Preliminary result)   Collection Time: 08/08/16  6:45 PM  Result Value Ref Range Status   Specimen Description BLOOD LEFT ANTECUBITAL  Final   Special Requests BOTTLES DRAWN AEROBIC ONLY  6CC  Final   Culture   Final    NO GROWTH 4 DAYS [facility]    Report Status PENDING  Incomplete  Culture, blood (x 2)     Status: None (Preliminary result)   Collection Time: 08/09/16  4:24 AM  Result Value Ref Range Status   Specimen Description BLOOD RIGHT WRIST  Final   Special Requests BOTTLES DRAWN AEROBIC AND ANAEROBIC 5CC  Final   Culture   Final    NO GROWTH 4 DAYS [facility]  Report Status PENDING  Incomplete  C difficile quick screen w PCR reflex     Status: Abnormal   Collection Time: 08/09/16  1:41 PM  Result Value Ref Range Status   C Diff antigen POSITIVE (A) NEGATIVE Final   C Diff toxin NEGATIVE NEGATIVE Final   C Diff interpretation Results are indeterminate. See PCR results.  Final  Gastrointestinal Panel by PCR , Stool     Status: Abnormal   Collection Time: 08/09/16  1:41 PM  Result Value Ref Range Status   Campylobacter species NOT DETECTED NOT DETECTED Final   Plesimonas shigelloides NOT DETECTED NOT DETECTED Final   Salmonella species NOT DETECTED NOT DETECTED Final   Yersinia enterocolitica NOT DETECTED NOT DETECTED Final   Vibrio species NOT DETECTED NOT DETECTED Final   Vibrio cholerae NOT DETECTED NOT DETECTED Final   Enteroaggregative E coli (EAEC) NOT DETECTED NOT DETECTED Final   Enteropathogenic E coli (EPEC) NOT DETECTED NOT DETECTED Final   Enterotoxigenic E coli (ETEC) NOT DETECTED NOT  DETECTED Final   Shiga like toxin producing E coli (STEC) NOT DETECTED NOT DETECTED Final   E. coli O157 NOT DETECTED NOT DETECTED Final   Shigella/Enteroinvasive E coli (EIEC) NOT DETECTED NOT DETECTED Final   Cryptosporidium NOT DETECTED NOT DETECTED Final   Cyclospora cayetanensis NOT DETECTED NOT DETECTED Final   Entamoeba histolytica NOT DETECTED NOT DETECTED Final   Giardia lamblia NOT DETECTED NOT DETECTED Final   Adenovirus F40/41 NOT DETECTED NOT DETECTED Final   Astrovirus NOT DETECTED NOT DETECTED Final   Norovirus GI/GII DETECTED (A) NOT DETECTED Final    Comment: SPOKE WITH MARIA DELEON RN AT 0411 08/10/16 SDR   Rotavirus A NOT DETECTED NOT DETECTED Final   Sapovirus (I, II, IV, and V) NOT DETECTED NOT DETECTED Final  Clostridium Difficile by PCR     Status: Abnormal   Collection Time: 08/09/16  1:41 PM  Result Value Ref Range Status   Toxigenic C Difficile by pcr QUANTITY NOT SUFFICIENT TO REPEAT TEST (A) NEGATIVE Final    CommentByrd Hesselbach, RN AWARE AT 2200 08/09/16 SCOTTEN,M SEE RECOLLECT [id number]      Radiology Studies: Dg Abd 2 Views  Result Date: 08/13/2016 CLINICAL DATA:  Constipation abdominal distention EXAM: ABDOMEN - 2 VIEW COMPARISON:  August 11, 2016 FINDINGS: Supine and decubitus images obtained. There is a fairly mild volume stool present. There is no bowel dilatation or air-fluid levels suggesting bowel obstruction. No free air. Visualized lung bases are clear. No abnormal calcifications. Intrauterine device is noted in the mid-pelvis. There is marked scoliosis. IMPRESSION: Fairly mild stool volume present. No bowel obstruction or free air. Intrauterine device in mid pelvis. Visualized lung bases clear. Electronically Signed   By: Bretta Bang III M.D.   On: 08/13/2016 12:59    Scheduled Meds: . ARIPiprazole  2 mg Oral Daily  . bisacodyl  10 mg Rectal TID  . clonazePAM  1 mg Oral q morning - 10a  . heparin  5,000 Units Subcutaneous Q8H  . polyethylene  glycol  17 g Oral TID  . sodium chloride flush  3 mL Intravenous Q12H   Continuous Infusions: . sodium chloride 100 mL/hr at 08/13/16 1355     LOS: 4 days   Time spent: 25 minutes.  Penny Pia, MD [facility] Pager [phone]  If 7PM-7AM, please contact night-coverage [email or web address] Password [email or web address] 08/13/2016, 5:52 PM

## 2016-08-13 NOTE — Progress Notes (Signed)
Subjective: Several bowel movements over past 24 hours.  Objective: Vital signs in last 24 hours: Temp:  [97.7 F (36.5 C)-98.3 F (36.8 C)] 98.1 F (36.7 C) (12/18 0629) Pulse Rate:  [76-97] 97 (12/18 0629) Resp:  [12-18] 12 (12/18 0629) BP: (97-114)/(63-76) 110/76 (12/18 0629) SpO2:  [97 %-100 %] 99 % (12/18 0629) Weight change:  Last BM Date: 08/12/16  PE: GEN:  Pleasant, quadriplegic, contractured ABD:  Moderate distention, mild diffuse tenderness (about same as past few days), with tympany, no peritonitis  Lab Results: CBC    Component Value Date/Time   WBC 6.5 08/11/2016 0502   RBC 4.01 08/11/2016 0502   HGB 12.7 08/11/2016 0502   HCT 36.9 08/11/2016 0502   PLT 304 08/11/2016 0502   MCV 92.0 08/11/2016 0502   MCH 31.7 08/11/2016 0502   MCHC 34.4 08/11/2016 0502   RDW 13.1 08/11/2016 0502   LYMPHSABS 2.5 09/06/2015 1635   MONOABS 0.4 09/06/2015 1635   EOSABS 0.1 09/06/2015 1635   BASOSABS 0.0 09/06/2015 1635   CMP     Component Value Date/Time   NA 137 08/13/2016 0427   K 3.5 08/13/2016 0427   CL 108 08/13/2016 0427   CO2 24 08/13/2016 0427   GLUCOSE 112 (H) 08/13/2016 0427   BUN <5 (L) 08/13/2016 0427   CREATININE 0.35 (L) 08/13/2016 0427   CREATININE 0.41 (L) 09/06/2015 1635   CALCIUM 8.7 (L) 08/13/2016 0427   PROT 5.2 (L) 08/08/2016 1920   ALBUMIN 2.8 (L) 08/08/2016 1920   AST 24 08/08/2016 1920   ALT 29 08/08/2016 1920   ALKPHOS 27 (L) 08/08/2016 1920   BILITOT 0.9 08/08/2016 1920   GFRNONAA >60 08/13/2016 0427   GFRNONAA >89 09/06/2015 1635   GFRAA >60 08/13/2016 0427   GFRAA >89 09/06/2015 1635   Assessment:  1. Fecal impaction. Several bowel movements over the past couple days. 2. Quadriplegia. 3. Nausea and vomiting.  Improving. 4. Abnormal CT scan abdomen, diffuse fecal impaction with subtotal colonic obstruction. Afebrile. No leukocytosis.  Plan:  1.  Continue full liquid diet. 2.  Continue Miralax TID and bisacodyl  suppositories TID. 3.  Repeat abdominal xray today. 4.  Ask nursing to retry manual disimpaction. 5.  [facility] will follow.    Freddy JakschOUTLAW,Jaydah Stahle M 08/13/2016, 11:13 AM   Pager [phone] If no answer or after 5 PM call [phone]

## 2016-08-14 LAB — CULTURE, BLOOD (ROUTINE X 2)
CULTURE: NO GROWTH
CULTURE: NO GROWTH

## 2016-08-14 MED ORDER — POLYETHYLENE GLYCOL 3350 17 G PO PACK: 17.0000 g | PACK | Freq: Two times a day (BID) | ORAL | Status: DC

## 2016-08-14 MED ORDER — POLYETHYLENE GLYCOL 3350 17 G PO PACK: 17.0000 g | PACK | Freq: Two times a day (BID) | ORAL | 0 refills | Status: DC

## 2016-08-14 NOTE — Progress Notes (Signed)
A call to patients father Kemper Durieheldelron Somera [phone] to require about transportation home for pt.  Mr. Roxan HockeyRobinson states, he should have been told yesterday about [name] (pt) being discharged.  He continued with he will not have any help, Adelin's caregiver are most likely re-assigned to someone else.  Asked Mr.Giroux if he could call [facility] to inform them of pt's discharge and to check on caregivers. Mr. Roxan HockeyRobinson states that he do not have their number. Mr. Roxan HockeyRobinson was not sure about picking pt up.  This CM made a call to [facility] [phone], Crystal was in a meeting, spoke with Ms Belva Ageeeggy Baker and Steward DroneBrenda concerning pt's discharge. Steward DroneBrenda states that she will arrange for Zarah's caregivers to come pick her up after 3 PM and Mr. Roxan HockeyRobinson will be called.

## 2016-08-14 NOTE — Progress Notes (Signed)
Gave patient and patient caregiver discharge instructions. Answered questions. Discussed medications. Patient will be D/C with caregiver in stable condition. Father aware.

## 2016-08-14 NOTE — Progress Notes (Addendum)
CSW received consult that patient does not want to return home with her father. CSW & RNCM, Cookie spoke with patient re: discharge planning. Patient called Crystal with [facility] who states that she has been working with her and her father to get her into an AFL/Alternative Family Living. Patient already has services through [facility] - daily visits from 5:45-7 & assist her with getting ready and then she goes to a day program. CSW & RNCM confirmed with patient's care coordinator, Octavio GravesBonita [phone] that they are in the process to get her into an AFL - the process usually takes 14 days. Patient's father is agreeable with having patient go after the holidays.    Lincoln MaxinKelly Harlea Goetzinger, LCSW [facility] Clinical Social Worker cell #: [phone]

## 2016-08-14 NOTE — Discharge Summary (Signed)
Physician Discharge Summary  Nancy [name] ZOX:[id number] DOB: [birth year] DOA: 08/08/2016  PCP: Leo Grosser, MD  Admit date: 08/08/2016 Discharge date: 08/14/2016  Time spent: > 35 minutes  Recommendations for Outpatient Follow-up:  1. Please ensure patient follows up with her GI specialist   Discharge Diagnoses:  Principal Problem:   Nausea vomiting and diarrhea Active Problems:   Congenital quadriplegia (HCC)   Paranoia (HCC)   SIRS (systemic inflammatory response syndrome) (HCC)   Fecal impaction (HCC)   Discharge Condition: stable  Diet recommendation: full liquid diet  Filed Weights   08/08/16 1802 08/09/16 0445  Weight: 52.2 kg (115 lb) 45.6 kg (100 lb 8 oz)    History of present illness:  Nancy N Robinsonis a 25 y.o.femalewith a history of CP, mild MR, mood disorder, and seizures who presented 12/14 for emesis, loose stools and diffuse abdominal pain. without hematochezia or melena. On arrival she was tachycardic and normotensive. CT abdomen findings of fecal impaction and resulting bowel obstruction. She has had some loose stools since admission but unsuccessful disimpaction and minimal stool output with tap water and then mineral oil enemas. GI was consulted, recommending miralax cleanout which has been unsuccessful, repeat abd XR 12/16 showing persistent impaction. Has had some progress with large bowel movements following suppositories. Plan to continue suppositories and miralax.  Hospital Course:   Fecal impaction:  - GI on board and recommended the following:  1.  Advance diet as tolerated. 2.  Stop bisacodyl suppositories. 3.  Decrease Miralax to 17 g po bid and continue this dose upon discharge. 4.  Patient seems ok to be discharged home from GI perspective. 5.  [facility] will sign-off; patient can follow-up with [place] on as-needed basis; please call with questions; thank you for the consultation.  Noroviral gastroenteritis: CDiff panel  showed CDiff without toxin and no PCR was able to be performed. Now menstruating and incontinent with multiple laxatives/enemas limiting yield of recollection. Because norovirus alone would explain these findings, will hold empiric C Diff treatment.  - good hand hygeine recommended  Hypokalemia:  - resolved with improvement in oral intake.   Menorrhagia with normal cycle: Chronic, currently menstruating. IUD seen on CT in correct position.  - Drop in hgb noted (16.8 > 12.3), stable at 12.7 on recheck. suspect significant hemoconcentration at admission.  Distended urinary bladder  - no complaints on day of d/c  Anxiety/history of paranoia: Chronic and stable.  - Continue home medication regimen  Procedures:  None  Consultations:  GI  Discharge Exam: Vitals:   08/13/16 2035 08/14/16 0637  BP: (!) 100/51 105/60  Pulse: 95 91  Resp: 16 16  Temp: 98.6 F (37 C) 98.1 F (36.7 C)    General: Pt in nad, alert and awake Cardiovascular: rrr, no rubs Respiratory: no increased wob, no wheezes  Discharge Instructions   Discharge Instructions    Call MD for:  redness, tenderness, or signs of infection (pain, swelling, redness, odor or green/yellow discharge around incision site)    Complete by:  As directed    Call MD for:  temperature >100.4    Complete by:  As directed    Diet - low sodium heart healthy    Complete by:  As directed    Discharge instructions    Complete by:  As directed    Please be sure to follow up with your GI specialist in 1-2 weeks or sooner   Increase activity slowly    Complete by:  As directed  Current Discharge Medication List    START taking these medications   Details  polyethylene glycol (MIRALAX / GLYCOLAX) packet Take 17 g by mouth 2 (two) times daily. Qty: 14 each, Refills: 0      CONTINUE these medications which have NOT CHANGED   Details  ARIPiprazole (ABILIFY) 2 MG tablet TAKE ONE TABLET BY MOUTH ONCE DAILY Qty: 30  tablet, Refills: 6    clonazePAM (KLONOPIN) 1 MG tablet Take 1 tablet (1 mg total) by mouth every morning. Qty: 30 tablet, Refills: 2    clotrimazole (LOTRIMIN) 1 % cream Apply 1 application topically 2 (two) times daily. For 1 week Qty: 30 g, Refills: 1    levonorgestrel (MIRENA) 20 MCG/24HR IUD 1 each by Intrauterine route once.      STOP taking these medications     glycopyrrolate (ROBINUL) 1 MG tablet      cephALEXin (KEFLEX) 500 MG capsule        No Known Allergies Follow-up Information    Leo GrosserPICKARD,WARREN TOM, MD.   Specialty:  Family Medicine Why:  As needed Contact information: [address] [place] [address] [phone]        [facility]-EMERGENCY DEPT.   Specialty:  Emergency Medicine Why:  If symptoms worsen Contact information: [address] [id number] [place] [id number] [phone]           The results of significant diagnostics from this hospitalization (including imaging, microbiology, ancillary and laboratory) are listed below for reference.    Significant Diagnostic Studies: Dg Abd 1 View  Result Date: 08/11/2016 CLINICAL DATA:  Abdominal pain.  Fecal impaction.  Cerebral palsy. EXAM: ABDOMEN - 1 VIEW COMPARISON:  08/10/2016. FINDINGS: Normal caliber gas distended loops of bowel throughout the abdomen. Large amount of stool in the rectum. Marked dextroconvex thoracolumbar rotary scoliosis without significant change. IMPRESSION: Large amount of stool in the rectum, compatible with fecal impaction. Electronically Signed   By: Beckie SaltsSteven  Reid M.D.   On: 08/11/2016 12:42   Dg Abd 1 View  Result Date: 08/10/2016 CLINICAL DATA:  Fecal impaction EXAM: ABDOMEN - 1 VIEW COMPARISON:  Abdominal CT scan of August 09, 2016 FINDINGS: There is a large amount of stool in the rectum. Proximal to this the colon and portions of the small bowel exhibit moderate. No free extraluminal gas is observed. There  is severe lumbar scoliosis convex toward the right with a rotatory component. Gaseous distention. IMPRESSION: Fecal impaction. Moderate gaseous distention of bowel proximal to the rectum. Electronically Signed   By: David  SwazilandJordan M.D.   On: 08/10/2016 07:06   Ct Abdomen Pelvis W Contrast  Result Date: 08/09/2016 CLINICAL DATA:  Nausea, vomiting diarrhea, generalized abdominal pain for 1 day. History of cerebral palsy and contracture. EXAM: CT ABDOMEN AND PELVIS WITH CONTRAST TECHNIQUE: Multidetector CT imaging of the abdomen and pelvis was performed using the standard protocol following bolus administration of intravenous contrast. CONTRAST:  80mL ISOVUE-300 IOPAMIDOL (ISOVUE-300) INJECTION 61% COMPARISON:  None. FINDINGS: LOWER CHEST: Lung bases are clear. Included heart size is normal. No pericardial effusion. HEPATOBILIARY: LEFT lobe of the liver is partially herniated through the crus of the diaphragm. Low-density liver compatible with steatosis. Normal gallbladder. PANCREAS: Normal. SPLEEN: Normal. ADRENALS/URINARY TRACT: Kidneys are orthotopic, demonstrating symmetric enhancement. No hydronephrosis or solid renal masses. 9 mm cyst RIGHT interpolar kidney. Too small to characterize hypodensities in the kidneys bilaterally. 3 mm LEFT upper pole nephrolithiasis. The unopacified ureters are normal in course and  caliber. Distended urinary bladder to the level of the umbilicus. Normal adrenal glands. STOMACH/BOWEL: Small hiatal hernia. Rectum distended with feces, 8.5 cm in transaxial dimension. VASCULAR/LYMPHATIC: Aortoiliac vessels are normal in course and caliber. Diminutive intrahepatic inferior vena cava. No lymphadenopathy by CT size criteria. REPRODUCTIVE: IUD in central uterus. Stool and fluid distended large bowel with air-fluid levels, 6 cm in transaxial dimension. Small bowel air-fluid levels. OTHER: No intraperitoneal free fluid or free air. MUSCULOSKELETAL: Nonacute. RIGHT lateral abdominal wall  varicosities. Broad thoracolumbar dextroscoliosis. Shallow LEFT acetabulum without dislocation. 7 mm probable Bartholin cyst. IMPRESSION: CT findings of fecal impaction and resulting bowel obstruction. Distended urinary bladder, recommend correlation with urinary output. RIGHT abdominal wall varicosities. Diminutive intrahepatic inferior vena cava, not tailored for evaluation. 3 mm nonobstructing LEFT nephrolithiasis. Electronically Signed   By: Awilda Metroourtnay  Bloomer M.D.   On: 08/09/2016 01:42   Dg Abd 2 Views  Result Date: 08/13/2016 CLINICAL DATA:  Constipation abdominal distention EXAM: ABDOMEN - 2 VIEW COMPARISON:  August 11, 2016 FINDINGS: Supine and decubitus images obtained. There is a fairly mild volume stool present. There is no bowel dilatation or air-fluid levels suggesting bowel obstruction. No free air. Visualized lung bases are clear. No abnormal calcifications. Intrauterine device is noted in the mid-pelvis. There is marked scoliosis. IMPRESSION: Fairly mild stool volume present. No bowel obstruction or free air. Intrauterine device in mid pelvis. Visualized lung bases clear. Electronically Signed   By: Bretta BangWilliam  Woodruff III M.D.   On: 08/13/2016 12:59    Microbiology: Recent Results (from the past 240 hour(s))  Culture, blood (x 2)     Status: None (Preliminary result)   Collection Time: 08/08/16  6:45 PM  Result Value Ref Range Status   Specimen Description BLOOD LEFT ANTECUBITAL  Final   Special Requests BOTTLES DRAWN AEROBIC ONLY  6CC  Final   Culture   Final    NO GROWTH 4 DAYS Performed at [facility]    Report Status PENDING  Incomplete  Culture, blood (x 2)     Status: None (Preliminary result)   Collection Time: 08/09/16  4:24 AM  Result Value Ref Range Status   Specimen Description BLOOD RIGHT WRIST  Final   Special Requests BOTTLES DRAWN AEROBIC AND ANAEROBIC 5CC  Final   Culture   Final    NO GROWTH 4 DAYS Performed at [facility]    Report  Status PENDING  Incomplete  C difficile quick screen w PCR reflex     Status: Abnormal   Collection Time: 08/09/16  1:41 PM  Result Value Ref Range Status   C Diff antigen POSITIVE (A) NEGATIVE Final   C Diff toxin NEGATIVE NEGATIVE Final   C Diff interpretation Results are indeterminate. See PCR results.  Final  Gastrointestinal Panel by PCR , Stool     Status: Abnormal   Collection Time: 08/09/16  1:41 PM  Result Value Ref Range Status   Campylobacter species NOT DETECTED NOT DETECTED Final   Plesimonas shigelloides NOT DETECTED NOT DETECTED Final   Salmonella species NOT DETECTED NOT DETECTED Final   Yersinia enterocolitica NOT DETECTED NOT DETECTED Final   Vibrio species NOT DETECTED NOT DETECTED Final   Vibrio cholerae NOT DETECTED NOT DETECTED Final   Enteroaggregative E coli (EAEC) NOT DETECTED NOT DETECTED Final   Enteropathogenic E coli (EPEC) NOT DETECTED NOT DETECTED Final   Enterotoxigenic E coli (ETEC) NOT DETECTED NOT DETECTED Final   Shiga like toxin producing E coli (STEC) NOT DETECTED  NOT DETECTED Final   E. coli O157 NOT DETECTED NOT DETECTED Final   Shigella/Enteroinvasive E coli (EIEC) NOT DETECTED NOT DETECTED Final   Cryptosporidium NOT DETECTED NOT DETECTED Final   Cyclospora cayetanensis NOT DETECTED NOT DETECTED Final   Entamoeba histolytica NOT DETECTED NOT DETECTED Final   Giardia lamblia NOT DETECTED NOT DETECTED Final   Adenovirus F40/41 NOT DETECTED NOT DETECTED Final   Astrovirus NOT DETECTED NOT DETECTED Final   Norovirus GI/GII DETECTED (A) NOT DETECTED Final    Comment: SPOKE WITH MARIA DELEON RN AT 0411 08/10/16 SDR   Rotavirus A NOT DETECTED NOT DETECTED Final   Sapovirus (I, II, IV, and V) NOT DETECTED NOT DETECTED Final  Clostridium Difficile by PCR     Status: Abnormal   Collection Time: 08/09/16  1:41 PM  Result Value Ref Range Status   Toxigenic C Difficile by pcr QUANTITY NOT SUFFICIENT TO REPEAT TEST (A) NEGATIVE Final    Comment: MARIA,  RN AWARE AT 2200 08/09/16 SCOTTEN,M SEE RECOLLECT [id number]      Labs: Basic Metabolic Panel:  Recent Labs Lab 08/08/16 1920 08/10/16 0428 08/12/16 0705 08/13/16 0427  NA 140 136 137 137  K 3.5 3.7 3.2* 3.5  CL 114* 110 108 108  CO2 20* 20* 22 24  GLUCOSE 112* 78 99 112*  BUN 11 <5* <5* <5*  CREATININE <0.30* 0.32* <0.30* 0.35*  CALCIUM 6.8* 7.8* 8.4* 8.7*   Liver Function Tests:  Recent Labs Lab 08/08/16 1920  AST 24  ALT 29  ALKPHOS 27*  BILITOT 0.9  PROT 5.2*  ALBUMIN 2.8*    Recent Labs Lab 08/08/16 1920  LIPASE 29   No results for input(s): AMMONIA in the last 168 hours. CBC:  Recent Labs Lab 08/08/16 1844 08/10/16 0428 08/11/16 0502  WBC 23.9* 8.9 6.5  HGB 16.8* 12.3 12.7  HCT 49.0* 35.7* 36.9  MCV 94.4 92.5 92.0  PLT 386 248 304   Cardiac Enzymes: No results for input(s): CKTOTAL, CKMB, CKMBINDEX, TROPONINI in the last 168 hours. BNP: BNP (last 3 results) No results for input(s): BNP in the last 8760 hours.  ProBNP (last 3 results) No results for input(s): PROBNP in the last 8760 hours.  CBG: No results for input(s): GLUCAP in the last 168 hours.  Signed:  Penny Pia MD.  [facility] 08/14/2016, 12:09 PM

## 2016-08-14 NOTE — Progress Notes (Signed)
Subjective: Several bowel movements yesterday.  Objective: Vital signs in last 24 hours: Temp:  [98.1 F (36.7 C)-98.6 F (37 C)] 98.1 F (36.7 C) (12/19 0637) Pulse Rate:  [91-96] 91 (12/19 0637) Resp:  [16] 16 (12/19 0637) BP: (100-105)/(51-65) 105/60 (12/19 0637) SpO2:  [100 %] 100 % (12/19 0637) Weight change:  Last BM Date: 08/12/16  PE: GEN:  NAD ABD:  Soft, mild distention, mild tenderness (improving)  Lab Results: CBC    Component Value Date/Time   WBC 6.5 08/11/2016 0502   RBC 4.01 08/11/2016 0502   HGB 12.7 08/11/2016 0502   HCT 36.9 08/11/2016 0502   PLT 304 08/11/2016 0502   MCV 92.0 08/11/2016 0502   MCH 31.7 08/11/2016 0502   MCHC 34.4 08/11/2016 0502   RDW 13.1 08/11/2016 0502   LYMPHSABS 2.5 09/06/2015 1635   MONOABS 0.4 09/06/2015 1635   EOSABS 0.1 09/06/2015 1635   BASOSABS 0.0 09/06/2015 1635   CMP     Component Value Date/Time   NA 137 08/13/2016 0427   K 3.5 08/13/2016 0427   CL 108 08/13/2016 0427   CO2 24 08/13/2016 0427   GLUCOSE 112 (H) 08/13/2016 0427   BUN <5 (L) 08/13/2016 0427   CREATININE 0.35 (L) 08/13/2016 0427   CREATININE 0.41 (L) 09/06/2015 1635   CALCIUM 8.7 (L) 08/13/2016 0427   PROT 5.2 (L) 08/08/2016 1920   ALBUMIN 2.8 (L) 08/08/2016 1920   AST 24 08/08/2016 1920   ALT 29 08/08/2016 1920   ALKPHOS 27 (L) 08/08/2016 1920   BILITOT 0.9 08/08/2016 1920   GFRNONAA >60 08/13/2016 0427   GFRNONAA >89 09/06/2015 1635   GFRAA >60 08/13/2016 0427   GFRAA >89 09/06/2015 1635   ABD XRAY:  Personally reviewed, interval decrease in stool volume; no obstruction  Assessment:  1. Fecal impaction. Continues to have large amounts of stool output. 2. Quadriplegia. 3. Nausea and vomiting.  Improving. 4. Abnormal CT scan abdomen, diffuse fecal impaction with subtotal colonic obstruction. Afebrile. No leukocytosis. Follow-up xray yesterday shows interval decrease in stool volume.  Plan:  1.  Advance diet as tolerated. 2.   Stop bisacodyl suppositories. 3.  Decrease Miralax to 17 g po bid and continue this dose upon discharge. 4.  Patient seems ok to be discharged home from GI perspective. 5.  [facility] will sign-off; patient can follow-up with us on as-needed basis; please call with questions; thank you for the consultation.   Nancy JakschOUTLAW,Odie Edmonds M 08/14/2016, 8:38 AM   Pager [phone] If no answer or after 5 PM call [phone]

## 2016-08-30 ENCOUNTER — Encounter: Payer: Self-pay | Admitting: Family Medicine

## 2016-08-30 ENCOUNTER — Ambulatory Visit (INDEPENDENT_AMBULATORY_CARE_PROVIDER_SITE_OTHER): Payer: Medicare Other | Admitting: Family Medicine

## 2016-08-30 VITALS — BP 108/64 | HR 80 | Temp 98.1°F

## 2016-08-30 DIAGNOSIS — K5909 Other constipation: Secondary | ICD-10-CM | POA: Diagnosis not present

## 2016-08-30 MED ORDER — POLYETHYLENE GLYCOL 3350 17 G PO PACK: 17.0000 g | PACK | Freq: Every day | ORAL | 11 refills | Status: DC

## 2016-08-30 NOTE — Progress Notes (Signed)
Subjective:    Patient ID: Nancy PoeBrittany N [name], female    DOB: 1991/08/05, 26 y.o.   MRN: [id number]  HPI Patient was admitted to the hospital recently with abdominal pain. She was found to have fecal impaction and bowel obstruction secondary to this. She also had normal virus-induced gastroenteritis which was causing SIRS. Patient was treated with Dulcolax suppositories, enemas, MiraLAX twice daily and her fecal impaction gradually improved and resolved. She was discharged home on MiraLAX twice daily although she's only been taking it once a day. She was also instructed to discontinue glycopyrrolate in case this was contributing to her constipation.  She has not done this.  She states that her bowel movements are now regular. She is having normal bowel movements with no diarrhea twice a day. They are mushy at times. She denies any abdominal pain nausea or vomiting or fever Past Medical History:  Diagnosis Date  . Anemia    h/o iron deficiency  . CP (cerebral palsy) (HCC)    with mild contracture  . IUD (intrauterine device) in place 10/2012  . Menorrhagia with regular cycle 11/11/2012  . Mobility impaired 11/11/2012  . Mood disorder (HCC)   . MR (mental retardation)   . Peripheral vascular disease (HCC) 10/02/2006   bilateral DVT lower legs  . Seizures (HCC)    childhood  . Wheelchair bound    r/t CP   Past Surgical History:  Procedure Laterality Date  . EYE SURGERY     as a  child  . HIP SURGERY Right 2003  . INTRAUTERINE DEVICE (IUD) INSERTION N/A 11/12/2012   Procedure: INTRAUTERINE DEVICE (IUD) INSERTION;  Surgeon: Sherron MondayJody Bovard, MD;  Location: [facility];  Service: Gynecology;  Laterality: N/A;  . LEG SURGERY     as a child  . MULTIPLE TOOTH EXTRACTIONS  10/18/10   Current Outpatient Prescriptions on File Prior to Visit  Medication Sig Dispense Refill  . ARIPiprazole (ABILIFY) 2 MG tablet TAKE ONE  TABLET BY MOUTH ONCE DAILY 30 tablet 6  . clonazePAM (KLONOPIN) 1 MG tablet Take 1 tablet (1 mg total) by mouth every morning. 30 tablet 2  . clotrimazole (LOTRIMIN) 1 % cream Apply 1 application topically 2 (two) times daily. For 1 week 30 g 1  . levonorgestrel (MIRENA) 20 MCG/24HR IUD 1 each by Intrauterine route once.     No current facility-administered medications on file prior to visit.    No Known Allergies Social History   Social History  . Marital status: Single    Spouse name: N/A  . Number of children: N/A  . Years of education: N/A   Occupational History  . Not on file.   Social History Main Topics  . Smoking status: Never Smoker  . Smokeless tobacco: Never Used  . Alcohol use No     Comment: Pt denies  . Drug use: No     Comment: Pt denies  . Sexual activity: No     Comment: lives in a group home.  single.   Other Topics Concern  . Not on file   Social History Narrative  . No narrative on file     Review of Systems  All other systems reviewed and are negative.      Objective:   Physical Exam  Constitutional: She is oriented to person, place, and time. She appears well-developed and well-nourished.  Cardiovascular: Normal rate, regular rhythm and normal heart sounds.  Exam reveals no gallop and no friction rub.   No murmur heard. Pulmonary/Chest: Effort normal and breath sounds normal. No respiratory distress. She has no wheezes. She has no rales.  Abdominal: Soft. Bowel sounds are normal.  Neurological: She is alert and  oriented to person, place, and time. She exhibits abnormal muscle tone. Coordination abnormal.  Reflex Scores:      Tricep reflexes are 3+ on the right side and 3+ on the left side.      Bicep reflexes are 3+ on the right side and 3+ on the left side.      Brachioradialis reflexes are 3+ on the right side and 3+ on the left side. Vitals reviewed.      Assessment & Plan:  Chronic constipation  Continue MiraLAX once a day chronically as this seems to be managing her chronic constipation. Discontinue glycopyrrolate as this may contribute to her constipation. I explained this to the patient. Her drooling is a nuisance but is not dangerous and therefore I believe the risk of glycopyrrolate outweighs its benefit

## 2016-09-17 ENCOUNTER — Other Ambulatory Visit: Payer: Self-pay | Admitting: Family Medicine

## 2016-09-17 NOTE — Telephone Encounter (Signed)
Ok to refill 

## 2016-09-17 NOTE — Telephone Encounter (Signed)
ok 

## 2016-09-18 NOTE — Telephone Encounter (Signed)
Medication called to pharmacy. 

## 2016-11-06 ENCOUNTER — Ambulatory Visit: Payer: Medicare Other | Admitting: Family Medicine

## 2016-11-15 ENCOUNTER — Other Ambulatory Visit: Payer: Self-pay | Admitting: Family Medicine

## 2016-11-15 NOTE — Telephone Encounter (Signed)
Ok to refill 

## 2016-11-15 NOTE — Telephone Encounter (Signed)
ok 

## 2016-11-15 NOTE — Telephone Encounter (Signed)
RX called in .

## 2016-11-28 ENCOUNTER — Ambulatory Visit (INDEPENDENT_AMBULATORY_CARE_PROVIDER_SITE_OTHER): Payer: Medicare Other | Admitting: Physician Assistant

## 2016-11-28 ENCOUNTER — Encounter: Payer: Self-pay | Admitting: Physician Assistant

## 2016-11-28 VITALS — BP 110/68 | HR 119 | Temp 97.4°F

## 2016-11-28 DIAGNOSIS — L0231 Cutaneous abscess of buttock: Secondary | ICD-10-CM | POA: Diagnosis not present

## 2016-11-28 MED ORDER — DOXYCYCLINE HYCLATE 100 MG PO TABS: 100.0000 mg | ORAL_TABLET | Freq: Two times a day (BID) | ORAL | 0 refills | Status: DC

## 2016-11-28 NOTE — Progress Notes (Signed)
    Patient ID: Nancy [name] MRN: [id number], DOB: July 31, 1991, 26 y.o. Date of Encounter: 11/28/2016, 1:06 PM    Chief Complaint:  Chief Complaint  Patient presents with  . pain in gluteal    x3days     HPI: 26 y.o. year old female  Presents with above.     Home Meds:   Outpatient Medications Prior to Visit  Medication Sig Dispense Refill  . ARIPiprazole (ABILIFY) 2 MG tablet TAKE ONE TABLET BY MOUTH ONCE DAILY 30 tablet 6  . clonazePAM (KLONOPIN) 1 MG tablet TAKE ONE TABLET EVERY MORNING 30 tablet 1  . clotrimazole (LOTRIMIN) 1 % cream Apply 1 application topically 2 (two) times daily. For 1 week 30 g 1  . levonorgestrel (MIRENA) 20 MCG/24HR IUD 1 each by Intrauterine route once.    . polyethylene glycol (MIRALAX / GLYCOLAX) packet Take 17 g by mouth daily. 30 each 11   No facility-administered medications prior to visit.     Allergies: No Known Allergies    Review of Systems: See HPI for pertinent ROS. All other ROS negative.    Physical Exam: Blood pressure 110/68, pulse (!) 119, temperature 97.4 F (36.3 C), temperature source Oral, SpO2 98 %., There is no height or weight on file to calculate BMI. General:  AAF in wheelchair. Appears in no acute distress. Neck: Supple. No thyromegaly. No lymphadenopathy. Lungs: Clear bilaterally to auscultation without wheezes, rales, or rhonchi. Breathing is unlabored. Heart: Regular rhythm. No murmurs, rubs, or gallops. Skin: At Right gluteal crease--there is ~ 0.5 cm area that is minimally raised--color same as surrounding skin---this area is firm with palpation. This area is very tender with palpation. It is very localized/focal. No firmness beyond the 0.5 cm diameter area. No deeper firmness. No fluctuance. She is wearing a diaper. There is stool in diaper. Psych: Has significant cerebral palsy     ASSESSMENT AND PLAN:  26 y.o. year old female with  1. Abscess of buttock, right This is a very early abscess that does  not require I & D. She is to start the antibiotic immediately and take as directed. Father and a staff member are with her and I have told them to make sure that someone inspects this area at least once daily and if site worsens at all follow-up with [place] immediately or site does not resolve upon completion of antibiotic follow-up as well. They voice understanding and agree. - doxycycline (VIBRA-TABS) 100 MG tablet; Take 1 tablet (100 mg total) by mouth 2 (two) times daily.  Dispense: 20 tablet; Refill: 0   Signed, [address] [place], [place], [facility] 11/28/2016 1:06 PM

## 2016-12-13 ENCOUNTER — Other Ambulatory Visit: Payer: Self-pay | Admitting: Family Medicine

## 2017-01-04 ENCOUNTER — Ambulatory Visit: Payer: Medicare Other | Admitting: Family Medicine

## 2017-01-07 ENCOUNTER — Ambulatory Visit (INDEPENDENT_AMBULATORY_CARE_PROVIDER_SITE_OTHER): Payer: Medicare Other | Admitting: Family Medicine

## 2017-01-07 ENCOUNTER — Encounter: Payer: Self-pay | Admitting: Family Medicine

## 2017-01-07 VITALS — BP 118/70 | HR 80 | Temp 98.3°F | Resp 16

## 2017-01-07 DIAGNOSIS — K219 Gastro-esophageal reflux disease without esophagitis: Secondary | ICD-10-CM

## 2017-01-07 MED ORDER — RANITIDINE HCL 150 MG PO TABS: 150.0000 mg | ORAL_TABLET | Freq: Every day | ORAL | 0 refills | Status: DC

## 2017-01-07 NOTE — Progress Notes (Signed)
Subjective:    Patient ID: Nancy [name], female    DOB: 10-08-1990, 26 y.o.   MRN: [id number]  HPI She presents with a one-week history of burning in her throat. She also reports a sour stomach. She reports a "wet burps". She thinks it may be heartburn. Recently she's been eating fatty foods, pork, and spicy foods. She denies any hematemesis. She denies any melena. She denies any hematochezia. She denies any abdominal pain. She did report some burning discomfort in her chest all of which improved after she was given some over-the-counter antacid over the weekend. Past Medical History:  Diagnosis Date  . Anemia    h/o iron deficiency  . CP (cerebral palsy) (HCC)    with mild contracture  . IUD (intrauterine device) in place 10/2012  . Menorrhagia with regular cycle 11/11/2012  . Mobility impaired 11/11/2012  . Mood disorder (HCC)   . MR (mental retardation)   . Peripheral vascular disease (HCC) 10/02/2006   bilateral DVT lower legs  . Seizures (HCC)    childhood  . Wheelchair bound    r/t CP   Past Surgical History:  Procedure Laterality Date  . EYE SURGERY     as a child  . HIP SURGERY Right 2003  . INTRAUTERINE DEVICE (IUD) INSERTION N/A 11/12/2012   Procedure: INTRAUTERINE DEVICE (IUD) INSERTION;  Surgeon: Sherron MondayJody Bovard, MD;  Location: [facility];  Service: Gynecology;  Laterality: N/A;  . LEG SURGERY     as a child  . MULTIPLE TOOTH EXTRACTIONS  10/18/10   Current Outpatient Prescriptions on File Prior to Visit  Medication Sig Dispense Refill  . ARIPiprazole (ABILIFY) 2 MG tablet TAKE ONE TABLET EACH DAY 30 tablet 1  . clonazePAM (KLONOPIN) 1 MG tablet TAKE ONE TABLET EVERY MORNING 30 tablet 1  . levonorgestrel (MIRENA) 20 MCG/24HR IUD 1 each by Intrauterine route once.    . polyethylene glycol (MIRALAX / GLYCOLAX) packet Take 17 g by mouth daily. 30 each 11   No current facility-administered medications on file prior to visit.    No Known Allergies Social History    Social History  . Marital status: Single    Spouse name: N/A  . Number of children: N/A  . Years of education: N/A   Occupational History  . Not on file.   Social History Main Topics  . Smoking status: Never Smoker  . Smokeless tobacco: Never Used  . Alcohol use No     Comment: Pt denies  . Drug use: No     Comment: Pt denies  . Sexual activity: No     Comment: lives in a group home.  single.   Other Topics Concern  . Not on file   Social History Narrative  . No narrative on file      Review of Systems  All other systems reviewed and are negative.      Objective:   Physical Exam  Constitutional: She appears well-developed and well-nourished.  HENT:  Nose: Nose normal.  Mouth/Throat: Oropharynx is clear and moist. No oropharyngeal exudate.  Eyes: Conjunctivae are normal.  Neck: Neck supple.  Cardiovascular: Normal rate, regular rhythm and normal heart sounds.  Exam reveals no gallop and no friction rub.   No murmur heard. Pulmonary/Chest: Effort normal and breath sounds normal. No respiratory distress. She has no wheezes. She has no rales.  Abdominal: Soft. Bowel sounds are normal. She exhibits no distension and no mass. There is no tenderness. There is no rebound  and no guarding.  Lymphadenopathy:    She has no cervical adenopathy.  Vitals reviewed.         Assessment & Plan:  Gastroesophageal reflux disease, esophagitis presence not specified  Recommended that the patient take Zantac 150 mg by mouth daily at bedtime for 2 weeks then discontinue the medication to see if symptoms persist. I do suspect that this is GERD rather than a sore throat due to a virus or allergies based on her exam and history

## 2017-01-16 ENCOUNTER — Other Ambulatory Visit: Payer: Self-pay | Admitting: Family Medicine

## 2017-01-16 NOTE — Telephone Encounter (Signed)
Ok to refill 

## 2017-01-16 NOTE — Telephone Encounter (Signed)
ok 

## 2017-02-06 ENCOUNTER — Other Ambulatory Visit: Payer: Self-pay | Admitting: Family Medicine

## 2017-02-07 ENCOUNTER — Encounter: Payer: Self-pay | Admitting: Family Medicine

## 2017-02-07 ENCOUNTER — Ambulatory Visit (INDEPENDENT_AMBULATORY_CARE_PROVIDER_SITE_OTHER): Payer: Medicare Other | Admitting: Family Medicine

## 2017-02-07 VITALS — BP 126/62 | HR 88 | Temp 98.1°F | Wt 115.0 lb

## 2017-02-07 DIAGNOSIS — F259 Schizoaffective disorder, unspecified: Secondary | ICD-10-CM

## 2017-02-07 DIAGNOSIS — G8250 Quadriplegia, unspecified: Secondary | ICD-10-CM

## 2017-02-07 MED ORDER — ARIPIPRAZOLE 5 MG PO TABS: 5.0000 mg | ORAL_TABLET | Freq: Every day | ORAL | 3 refills | Status: DC
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
## 2017-02-08 ENCOUNTER — Telehealth: Payer: Self-pay | Admitting: Family Medicine

## 2017-02-08 NOTE — Telephone Encounter (Signed)
-----   Message from Donita BrooksWarren T Pickard, MD sent at 02/08/2017  6:32 AM EDT ----- Please call her wheelchair provider [phone]) and see what forms I need to fill out to get new leg strap for right leg, new seat belt, and new back positioning pads.  Does not need new wheelchair.

## 2017-02-13 ENCOUNTER — Telehealth: Payer: Self-pay | Admitting: Family Medicine
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
## 2017-02-14 NOTE — Telephone Encounter (Signed)
Called and lmtrc on 02/11/17

## 2017-02-22 NOTE — Telephone Encounter (Signed)
Left detailed message after a 20 minute hold time with no pick up and that this was my 2nd call and I needed to get wheelchair accessories for this pt to please call me back.

## 2017-02-25 NOTE — Telephone Encounter (Signed)
Called [employer] at [phone] and spoke to service department - they will contact pt and get her set up for new equipment on her wheel chair and will fax over an order after they establish exactly what she needs.

## 2017-03-01 ENCOUNTER — Ambulatory Visit: Payer: Medicare Other | Admitting: Family Medicine

## 2017-03-07 ENCOUNTER — Ambulatory Visit (INDEPENDENT_AMBULATORY_CARE_PROVIDER_SITE_OTHER): Payer: Medicare Other | Admitting: Family Medicine

## 2017-03-07 VITALS — BP 124/68 | HR 78 | Temp 99.0°F | Resp 18

## 2017-03-07 DIAGNOSIS — F259 Schizoaffective disorder, unspecified: Secondary | ICD-10-CM
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
## 2017-03-08 ENCOUNTER — Telehealth: Payer: Self-pay | Admitting: Family Medicine

## 2017-03-08 MED ORDER — ARIPIPRAZOLE 5 MG PO TABS: 5.0000 mg | ORAL_TABLET | Freq: Every day | ORAL | 3 refills | Status: DC

## 2017-03-08 NOTE — Telephone Encounter (Signed)
Medication called/sent to requested pharmacy  

## 2017-03-08 NOTE — Telephone Encounter (Signed)
Patient is calling to see if she can get refill on her abilify  [facility] [place]

## 2017-04-02 ENCOUNTER — Ambulatory Visit (INDEPENDENT_AMBULATORY_CARE_PROVIDER_SITE_OTHER): Payer: Medicare Other | Admitting: Family Medicine

## 2017-04-02 VITALS — BP 118/68 | HR 74 | Temp 98.0°F

## 2017-04-02 DIAGNOSIS — G8250 Quadriplegia, unspecified: Secondary | ICD-10-CM | POA: Diagnosis not present

## 2017-04-02 DIAGNOSIS — G253 Myoclonus: Secondary | ICD-10-CM

## 2017-04-02 NOTE — Progress Notes (Signed)
Subjective:    Patient ID: Nancy PoeBrittany N [name], female    DOB: Oct 21, 1990, 26 y.o.   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   Over the last week, her caregiver has noticed random isolated jerking motions of the upper and lower extremities and also twisting motions of her head and neck. Movements are large, sudden, and brief. They terminate rapidly. However they're unprovoked. Patient and caregiver deny any seizure-like activity or loss of consciousness, bowel or bladder incontinence, tongue biting. They deny any recent head trauma, fevers, neck stiffness, medication changes. Patient was recently increased to 5 mg a day on Abilify. She already demonstrates spastic rigidity in her upper and lower extremities. She does not demonstrate any tar dive dyskinesia. I'm witnessing today during her exam or myoclonic jerks of her arms and legs. They occur independently occasionally and also together at times  Past Medical History:  Diagnosis Date  . Anemia    h/o iron deficiency  . CP (cerebral palsy) (HCC)    with mild contracture  . IUD (intrauterine device) in place 10/2012  . Menorrhagia with regular cycle 11/11/2012  . Mobility impaired 11/11/2012  . Mood disorder (HCC)   . MR (mental retardation)   . Peripheral vascular disease (HCC) 10/02/2006   bilateral DVT lower legs  . Seizures (HCC)    childhood  . Wheelchair bound    r/t CP   Past Surgical History:  Procedure Laterality Date  . EYE SURGERY     as a child  . HIP SURGERY Right 2003  . INTRAUTERINE DEVICE (IUD) INSERTION N/A 11/12/2012   Procedure: INTRAUTERINE DEVICE (IUD) INSERTION;  Surgeon: Sherron MondayJody Bovard, MD;  Location: [facility];  Service: Gynecology;  Laterality: N/A;  . LEG SURGERY     as a child  . MULTIPLE TOOTH EXTRACTIONS  10/18/10   Current Outpatient Prescriptions on File Prior to Visit  Medication Sig Dispense Refill  . ARIPiprazole (ABILIFY) 5 MG tablet Take 1  tablet (5 mg total) by mouth daily. 30 tablet 3  . clonazePAM (KLONOPIN) 1 MG tablet TAKE ONE TABLET EVERY MORNING 30 tablet 2  . levonorgestrel (MIRENA) 20 MCG/24HR IUD 1 each by Intrauterine route once.    . polyethylene glycol (MIRALAX / GLYCOLAX) packet Take 17 g by mouth daily. 30 each 11  . ranitidine (ZANTAC) 150 MG tablet Take 1 tablet (150 mg total) by mouth at bedtime. 14 tablet 0   No current facility-administered medications on file prior to visit.    No Known Allergies Social History   Social History  . Marital status: Single    Spouse name: N/A  . Number of children: N/A  . Years of education: N/A   Occupational History  . Not on file.   Social History Main Topics  . Smoking status: Never Smoker  . Smokeless tobacco: Never Used  . Alcohol use No     Comment: Pt denies  . Drug use: No     Comment: Pt denies  . Sexual activity: No     Comment: lives in a group home.  single.   Other Topics Concern  . Not on file   Social History Narrative  . No narrative on file     Review of Systems  All other systems reviewed and are negative.      Objective:   Physical Exam  Constitutional: She is oriented to person, place, and time. She appears well-developed and well-nourished.  Cardiovascular: Normal rate, regular rhythm and normal heart sounds.  Exam reveals no gallop and no friction rub.   No murmur heard. Pulmonary/Chest: Effort normal and breath sounds normal. No respiratory distress. She has no wheezes. She has no rales.  Abdominal: Soft. Bowel sounds are normal.  Neurological: She is alert and oriented to person, place, and time. She exhibits abnormal muscle tone. Coordination abnormal.  Reflex Scores:      Tricep reflexes are 3+ on the right side and 3+ on the left side.      Bicep reflexes are 3+ on the right side and 3+ on the left side.      Brachioradialis reflexes are 3+ on the right side and 3+ on the left side. Vitals reviewed.      Assessment  & Plan:   Multifocal myoclonus - Plan: Ambulatory referral to Neurology  Spastic quadriplegia [facility])  I will consult neurology. Patient appears to be having myoclonus. There appears to be no generalized seizure disorder. Given her history of cerebral palsy, question if this is due to hypoxic insults to the brain suffered in the remote past. I am not sure if the patient would benefit from an EEG however her history does not demonstrate any generalized tonic-clonic seizures.

## 2017-04-09 ENCOUNTER — Other Ambulatory Visit: Payer: Self-pay | Admitting: Family Medicine

## 2017-04-09 NOTE — Telephone Encounter (Signed)
Ok to refill 

## 2017-04-09 NOTE — Telephone Encounter (Signed)
ok 

## 2017-04-10 NOTE — Telephone Encounter (Signed)
Medication called to pharmacy. 

## 2017-05-07 ENCOUNTER — Telehealth: Payer: Self-pay | Admitting: *Deleted

## 2017-05-07 NOTE — Telephone Encounter (Signed)
Received VM from patient father.   Requested refill on medication, but was unable to ascertain medication name.   Call placed to patient father to inquire. No answer, no VM.

## 2017-05-08 NOTE — Telephone Encounter (Signed)
Call placed to patient father. LMTRC.  

## 2017-05-09 NOTE — Telephone Encounter (Signed)
Multiple calls placed to patient father with no answer and no return call.   Message to be closed.

## 2017-05-30 ENCOUNTER — Encounter: Payer: Self-pay | Admitting: Neurology

## 2017-05-30 ENCOUNTER — Ambulatory Visit (INDEPENDENT_AMBULATORY_CARE_PROVIDER_SITE_OTHER): Payer: Medicare Other | Admitting: Neurology

## 2017-05-30 VITALS — BP 105/80 | HR 88 | Ht 61.0 in

## 2017-05-30 DIAGNOSIS — G253 Myoclonus: Secondary | ICD-10-CM | POA: Diagnosis not present

## 2017-05-30 NOTE — Patient Instructions (Addendum)
   We will check an EEG study.  Please call our office if the jerking spells continue.

## 2017-05-30 NOTE — Progress Notes (Signed)
Reason for visit: Myoclonus  Referring physician: Dr. Pickard  Nancy [name] is a 26 y.o. female  History of present illness:  Nancy [name] is a 26 year old black female with a history of cerebral palsy with spastic quadriparesis. The patient has a history of childhood seizures. She comes in to the office by herself today, she has a history of mild mental retardation. She was sent in to the office for episodes of myoclonus that mainly involves the legs, she will occasionally have an event of head turning. The patient herself indicates that she has not had any recent events, she has not had episodes in 2 or 3 weeks. The patient also claims that she will startle easily. She apparently was increased on the Abilify's shortly before the myoclonic episodes began. The patient denies any other new issues with headaches, dizziness, vision changes, or new numbness or weakness of extremities. Again, she does not believe that she has had much trouble with the episodes of jerking recently.  Past Medical History:  Diagnosis Date  . Anemia    h/o iron deficiency  . CP (cerebral palsy) (HCC)    with mild contracture  . IUD (intrauterine device) in place 10/2012  . Menorrhagia with regular cycle 11/11/2012  . Mobility impaired 11/11/2012  . Mood disorder (HCC)   . MR (mental retardation)   . Peripheral vascular disease (HCC) 10/02/2006   bilateral DVT lower legs  . Seizures (HCC)    childhood  . Wheelchair bound    r/t CP    Past Surgical History:  Procedure Laterality Date  . EYE SURGERY     as a child  . HIP SURGERY Right 2003  . INTRAUTERINE DEVICE (IUD) INSERTION N/A 11/12/2012   Procedure: INTRAUTERINE DEVICE (IUD) INSERTION;  Surgeon: Sherron Monday, MD;  Location: [facility];  Service: Gynecology;  Laterality: N/A;  . LEG SURGERY     as a child  . MULTIPLE TOOTH EXTRACTIONS  10/18/10    Family History  Problem Relation Age of Onset  . Cancer Mother   . Diabetes Father   . Alzheimer's  disease Maternal Grandfather   . Heart attack Paternal Grandmother        Died between 72 or 26 years old    Social history:  reports that she has never smoked. She has never used smokeless tobacco. She reports that she does not drink alcohol or use drugs.  Medications:  Prior to Admission medications   Medication Sig Start Date End Date Taking? Authorizing Provider  ARIPiprazole (ABILIFY) 5 MG tablet Take 1 tablet (5 mg total) by mouth daily. 03/08/17  Yes Donita Brooks, MD  clonazePAM (KLONOPIN) 1 MG tablet TAKE ONE TABLET EVERY MORNING 04/10/17  Yes Donita Brooks, MD  levonorgestrel (MIRENA) 20 MCG/24HR IUD 1 each by Intrauterine route once.   Yes [provider]  polyethylene glycol (MIRALAX / GLYCOLAX) packet Take 17 g by mouth daily. 08/30/16  Yes Donita Brooks, MD     No Known Allergies  ROS:  Out of a complete 14 system review of symptoms, the patient complains only of the following symptoms, and all other reviewed systems are negative.  Stiffness of the arms and legs  Blood pressure 105/80, pulse 88, height  (1.549 m).  Physical Exam  General: The patient is alert and cooperative at the time of the examination.  Eyes: Pupils are equal, round, and reactive to light. Discs areKari Baars The neck is supple,  no carotid bruits are noted.  Respiratory: The respiratory examination is clear.  Cardiovascular: The cardiovascular examination reveals a regular rate and rhythm, no obvious murmurs or rubs are noted.  Skin: Extremities are without significant edema.  Neurologic Exam  Mental status: The patient is alert and oriented x 3 at the time of the examination. The patient has apparent normal recent and remote memory, with an apparently normal attention span and concentration ability.  Cranial nerves: Facial symmetry is present. There is good sensation of the face to pinprick and soft touch bilaterally. The strength of the facial  muscles and the muscles to head turning and shoulder shrug are normal bilaterally. Speech is slightly dysarthric, not aphasic. Extraocular movements are full. Visual fields are full. The tongue is midline, and the patient has symmetric elevation of the soft palate. No obvious hearing deficits are noted.  Motor: The motor testing reveals 5 over 5 strength of the upper extremities. The patient has minimal use with the lower extremity is, she has some 3/5 adductor strength of the knees. Increased motor tone is noted on all 4 days..  Sensory: Sensory testing is intact to pinprick, soft touch and vibration sensation on all 4 extremities. No evidence of extinction is noted.  Coordination: Cerebellar testing reveals that she is able to perform finger-nose-finger bilaterally, some ataxia is noted. She is unable to perform heel-to-shin on either side  Gait and station: Gait could not be tested, the patient is nonambulatory, in a motorized wheelchair.  Reflexes: Deep tendon reflexes are symmetric, but are brisk bilaterally. Toes are downgoing bilaterally.   Assessment/Plan:  1. Cerebral palsy, spastic quadriparesis  2. Mild mental retardation  3. History of myoclonus  The patient indicates that she has done better recently with the episodes of myoclonus, she has episodes that are unassociated with altered mental status. 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 I will check an EEG study, the patient will need contact me if the episodes of jerking increase or do not completely disappear. Unfortunately, the history was limited to what the patient herself had to offer, no caretaker was with the patient today.  Marlan Palau MD 05/30/2017 3:40 PM  [facility] [address] [place], [place] [address]  Phone [phone] Fax [phone]

## 2017-06-03 ENCOUNTER — Other Ambulatory Visit: Payer: Self-pay | Admitting: Family Medicine

## 2017-06-03 NOTE — Telephone Encounter (Signed)
Last OV 8/7 LAST REFILL 8/15 Ok to refill?

## 2017-06-04 NOTE — Telephone Encounter (Signed)
ok 

## 2017-06-04 NOTE — Telephone Encounter (Signed)
Clonazepam called in to pharamacy  

## 2017-06-17 ENCOUNTER — Telehealth: Payer: Self-pay | Admitting: Neurology

## 2017-06-17 ENCOUNTER — Ambulatory Visit (INDEPENDENT_AMBULATORY_CARE_PROVIDER_SITE_OTHER): Payer: Medicare Other | Admitting: Neurology

## 2017-06-17 DIAGNOSIS — R259 Unspecified abnormal involuntary movements: Secondary | ICD-10-CM | POA: Diagnosis not present

## 2017-06-17 DIAGNOSIS — G253 Myoclonus: Secondary | ICD-10-CM

## 2017-06-17 NOTE — Procedures (Signed)
    History:  Nancy GentlesBrittany Scicchitano is a 26 year old patient with a history of cerebral palsy and spastic quadriparesis and a history of childhood seizures. The patient is being evaluated for episodes of myoclonus that involves the legs and occasionally are associated with head turning. The patient is being evaluated for these events.  This is a routine EEG. No skull defects are noted. Medications include Abilify, clonazepam, Mirena IUD, and MiraLAX.   EEG classification: Normal awake, technically difficult  Description of the recording: The background rhythms of this recording consists of a fairly well modulated medium amplitude alpha rhythm of 9 Hz that is reactive to eye opening and closure. As the record progresses, the patient appears to remain in the waking state throughout the recording. Photic stimulation was performed, resulting in a bilateral and symmetric photic driving response. Hyperventilation was not performed. Throughout the recording the patient is moving her head, this results in head movement artifact in both hemispheres commonly obscuring background rhythms. At no time during the recording does there appear to be evidence of spike or spike wave discharges or evidence of focal slowing. EKG monitor shows no evidence of cardiac rhythm abnormalities with a heart rate of 114.  Impression: This is a normal EEG recording in the waking state. No evidence of ictal or interictal discharges are seen. The study was technically difficult secondary to excessive head movements throughout the entirety of the recording resulting in movement artifact.

## 2017-06-17 NOTE — Telephone Encounter (Signed)
The EEG study was normal. I called the home phone.

## 2017-07-03 ENCOUNTER — Other Ambulatory Visit: Payer: Self-pay | Admitting: Family Medicine

## 2017-07-03 NOTE — Telephone Encounter (Signed)
Ok to refill 

## 2017-07-04 NOTE — Telephone Encounter (Signed)
Medication called to pharmacy. 

## 2017-07-04 NOTE — Telephone Encounter (Signed)
ok 

## 2017-08-07 ENCOUNTER — Ambulatory Visit: Payer: Medicare Other | Admitting: Family Medicine

## 2017-08-07 ENCOUNTER — Other Ambulatory Visit: Payer: Self-pay | Admitting: Family Medicine

## 2017-08-12 ENCOUNTER — Encounter: Payer: Medicare Other | Admitting: Family Medicine

## 2017-09-02 ENCOUNTER — Other Ambulatory Visit: Payer: Self-pay | Admitting: Family Medicine

## 2017-09-03 NOTE — Telephone Encounter (Signed)
Ok to refill Klonopin??  Last office visit 04/02/2017.  Last refill 07/04/2017, #2 refills.

## 2017-09-24 ENCOUNTER — Ambulatory Visit (INDEPENDENT_AMBULATORY_CARE_PROVIDER_SITE_OTHER): Payer: Medicare Other | Admitting: Family Medicine

## 2017-09-24 ENCOUNTER — Encounter: Payer: Self-pay | Admitting: Family Medicine

## 2017-09-24 VITALS — BP 110/62 | HR 78 | Temp 98.0°F | Resp 14

## 2017-09-24 DIAGNOSIS — G8250 Quadriplegia, unspecified: Secondary | ICD-10-CM | POA: Diagnosis not present

## 2017-09-24 DIAGNOSIS — K117 Disturbances of salivary secretion: Secondary | ICD-10-CM

## 2017-09-24 MED ORDER — GLYCOPYRROLATE 1 MG PO TABS: 1.0000 mg | ORAL_TABLET | Freq: Two times a day (BID) | ORAL | 0 refills | Status: DC

## 2017-09-24 NOTE — Progress Notes (Signed)
Subjective:    Patient ID: Nancy [name], female    DOB: Sep 27, 1990, 27 y.o.   MRN: [id number]  HPI Patient is here today accompanied by her caregiver requesting to resume medication for drooling.  She has chronic copious secretions.  These frequently drain down her chin and soaked through her clothing.  Because of her issues with posture due to cerebral palsy, this will frequently also stained her clothing.  From a quality of life standpoint, she does not like the constant wetness on her T-shirt and on her clothing.  Furthermore she is embarrassed by how it looks when she is at school or at work.  One year ago, we discontinued the medication due to constipation.  She is currently taking MiraLAX only on an as-needed basis. Past Medical History:  Diagnosis Date  . Anemia    h/o iron deficiency  . CP (cerebral palsy) (HCC)    with mild contracture  . IUD (intrauterine device) in place 10/2012  . Menorrhagia with regular cycle 11/11/2012  . Mobility impaired 11/11/2012  . Mood disorder (HCC)   . MR (mental retardation)   . Peripheral vascular disease (HCC) 10/02/2006   bilateral DVT lower legs  . Seizures (HCC)    childhood  . Wheelchair bound    r/t CP   Past Surgical History:  Procedure Laterality Date  . EYE SURGERY     as a child  . HIP SURGERY Right 2003  . INTRAUTERINE DEVICE (IUD) INSERTION N/A 11/12/2012   Procedure: INTRAUTERINE DEVICE (IUD) INSERTION;  Surgeon: Sherron Monday, MD;  Location: [facility];  Service: Gynecology;  Laterality: N/A;  . LEG SURGERY     as a child  . MULTIPLE TOOTH EXTRACTIONS  10/18/10   Current Outpatient Medications on File Prior to Visit  Medication Sig Dispense Refill  . ARIPiprazole (ABILIFY) 5 MG tablet TAKE ONE TABLET DAILY 30 tablet 0  . clonazePAM (KLONOPIN) 1 MG tablet TAKE ONE TABLET EVERY MORNING 30 tablet 2  . levonorgestrel (MIRENA) 20 MCG/24HR IUD 1 each by Intrauterine route once.    . polyethylene glycol (MIRALAX / GLYCOLAX)  packet Take 17 g by mouth daily. 30 each 11   No current facility-administered medications on file prior to visit.    No Known Allergies Social History   Socioeconomic History  . Marital status: Single    Spouse name: Not on file  . Number of children: Not on file  . Years of education: Not on file  . Highest education level: Not on file  Social Needs  . Financial resource strain: Not on file  . Food insecurity - worry: Not on file  . Food insecurity - inability: Not on file  . Transportation needs - medical: Not on file  . Transportation needs - non-medical: Not on file  Occupational History  . Not on file  Tobacco Use  . Smoking status: Never Smoker  . Smokeless tobacco: Never Used  Substance and Sexual Activity  . Alcohol use: No    Comment: Pt denies  . Drug use: No    Comment: Pt denies  . Sexual activity: No    Comment: lives in a group home.  single.  Other Topics Concern  . Not on file  Social History Narrative   Lives in an apartment with [facility] (assisted living)   Left handed      Review of Systems  All other systems reviewed and are negative.      Objective:  Physical Exam  Constitutional: She is oriented to person, place, and time. She appears well-developed and well-nourished.  Cardiovascular: Normal rate, regular rhythm and normal heart sounds.  Exam reveals no gallop and no friction rub.   No murmur heard. Pulmonary/Chest: Effort normal and breath sounds normal. No respiratory distress. She has no wheezes. She has no rales.  Abdominal: Soft. Bowel sounds are normal.  Neurological: She is alert and oriented to person, place, and time. She exhibits abnormal muscle tone. Coordination abnormal.  Reflex Scores:      Tricep reflexes are 3+ on the right side and 3+ on the left side.      Bicep reflexes are 3+ on the right side and 3+ on the left side.      Brachioradialis reflexes are 3+ on the right side and 3+ on the left side. Vitals reviewed.        Assessment & Plan:   Drooling  We can certainly try resuming the medication at a low dose such as 1 mg twice daily.  I explained to the patient that this has similar effects to Benadryl.  Can cause her to feel very tired, sleepy, dry mouth, urinary retention, and constipation.  Caregiver and patient must monitor for the symptoms.  If we resume the medication, I recommended that they go ahead and resume MiraLAX on a daily basis to prevent obstipation.  Resume glycopyrrolate 1 mg p.o. twice daily and recheck in 1 month via telephone

## 2017-10-04 ENCOUNTER — Other Ambulatory Visit: Payer: Self-pay | Admitting: Family Medicine

## 2017-12-12 ENCOUNTER — Other Ambulatory Visit: Payer: Self-pay | Admitting: Family Medicine

## 2017-12-12 NOTE — Telephone Encounter (Signed)
Ok to refill??  Last office visit 09/24/2017.  Last refill 09/03/2017, #2 refills.

## 2017-12-24 ENCOUNTER — Ambulatory Visit: Payer: Medicare Other | Admitting: Family Medicine

## 2017-12-26 ENCOUNTER — Ambulatory Visit (INDEPENDENT_AMBULATORY_CARE_PROVIDER_SITE_OTHER): Payer: Medicare Other | Admitting: Family Medicine

## 2017-12-26 ENCOUNTER — Encounter: Payer: Self-pay | Admitting: Family Medicine

## 2017-12-26 VITALS — BP 110/60 | HR 120 | Temp 97.6°F | Resp 18

## 2017-12-26 DIAGNOSIS — Z62820 Parent-biological child conflict: Secondary | ICD-10-CM

## 2017-12-26 DIAGNOSIS — Z7189 Other specified counseling: Secondary | ICD-10-CM | POA: Diagnosis not present
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
## 2018-01-10 ENCOUNTER — Telehealth: Payer: Self-pay | Admitting: *Deleted

## 2018-01-10 NOTE — Telephone Encounter (Signed)
Received call from Ms. Lindley Magnus, Child psychotherapist. [phone] telephone.   Reports that she is attempting to obtain orders for hospital bed per patient's request. Advised that patient will need to be seen for Face to Face Documentation for Medicare.   SW will contact family to schedule appointment.

## 2018-01-21 ENCOUNTER — Ambulatory Visit: Payer: Self-pay | Admitting: Family Medicine

## 2018-01-24 ENCOUNTER — Ambulatory Visit: Payer: Medicare Other | Admitting: Family Medicine

## 2018-01-31 ENCOUNTER — Telehealth: Payer: Self-pay | Admitting: Family Medicine

## 2018-01-31 ENCOUNTER — Ambulatory Visit: Payer: Medicare Other | Admitting: Family Medicine

## 2018-01-31 NOTE — Telephone Encounter (Signed)
Patients father left vm at 1:33 stating Nancy [name] had an appt at 3 something and he would like to reschedule for Monday. He didn't say why he would like to r/s to Monday he did say he wasn't trying to mess up our schedule.  CB# [phone]

## 2018-02-03 NOTE — Telephone Encounter (Signed)
I am willing to see her again but please explain to father that [facility] in [facility] last 6 months.  If that continues, we will not be able to continue seeing them.

## 2018-02-13 ENCOUNTER — Encounter: Payer: Self-pay | Admitting: Family Medicine

## 2018-02-14 NOTE — Telephone Encounter (Signed)
I called and spoke with father. He is aware that Dr. Tanya NonesPickard is willing to see her until she has another no show or late cancellation. He said that he was having public transportation bring her but he understands. I did make her an appointment for 02/25/18 at 2:15 and advised him if he didn't think they would be able to make this appointment to call a couple of days ahead. He voiced understanding.

## 2018-02-25 ENCOUNTER — Encounter: Payer: Self-pay | Admitting: Family Medicine

## 2018-02-25 ENCOUNTER — Ambulatory Visit (INDEPENDENT_AMBULATORY_CARE_PROVIDER_SITE_OTHER): Payer: Medicare Other | Admitting: Family Medicine

## 2018-02-25 VITALS — BP 118/68 | HR 98 | Temp 98.6°F | Resp 14

## 2018-02-25 DIAGNOSIS — G8250 Quadriplegia, unspecified: Secondary | ICD-10-CM

## 2018-02-25 LAB — COMPLETE METABOLIC PANEL WITH GFR
AG Ratio: 1.6 (calc) (ref 1.0–2.5)
ALT: 8 U/L (ref 6–29)
AST: 14 U/L (ref 10–30)
Albumin: 4.5 g/dL (ref 3.6–5.1)
Alkaline phosphatase (APISO): 45 U/L (ref 33–115)
BUN/Creatinine Ratio: 16 (calc) (ref 6–22)
BUN: 8 mg/dL (ref 7–25)
CALCIUM: 10 mg/dL (ref 8.6–10.2)
CHLORIDE: 105 mmol/L (ref 98–110)
CO2: 26 mmol/L (ref 20–32)
Creat: 0.49 mg/dL — ABNORMAL LOW (ref 0.50–1.10)
GFR, EST AFRICAN AMERICAN: 154 mL/min/{1.73_m2} (ref >=60)
GFR, Est Non African American: 133 mL/min/{1.73_m2} (ref >=60)
GLUCOSE: 112 mg/dL — AB (ref 65–99)
Globulin: 2.8 g/dL (calc) (ref 1.9–3.7)
POTASSIUM: 4 mmol/L (ref 3.5–5.3)
SODIUM: 140 mmol/L (ref 135–146)
Total Bilirubin: 0.4 mg/dL (ref 0.2–1.2)
Total Protein: 7.3 g/dL (ref 6.1–8.1)

## 2018-02-25 LAB — CBC WITH DIFFERENTIAL/PLATELET
BASOS PCT: 0.4 %
Basophils Absolute: 30 cells/uL (ref 0–200)
Eosinophils Absolute: 22 cells/uL (ref 15–500)
Eosinophils Relative: 0.3 %
HCT: 42.8 % (ref 35.0–45.0)
Hemoglobin: 14.4 g/dL (ref 11.7–15.5)
Lymphs Abs: 2028 cells/uL (ref 850–3900)
MCH: 31.4 pg (ref 27.0–33.0)
MCHC: 33.6 g/dL (ref 32.0–36.0)
MCV: 93.4 fL (ref 80.0–100.0)
MONOS PCT: 6.9 %
MPV: 9.8 fL (ref 7.5–12.5)
NEUTROS ABS: 4810 {cells}/uL (ref 1500–7800)
Neutrophils Relative %: 65 %
PLATELETS: 374 10*3/uL (ref 140–400)
RBC: 4.58 10*6/uL (ref 3.80–5.10)
RDW: 12.2 % (ref 11.0–15.0)
TOTAL LYMPHOCYTE: 27.4 %
WBC mixed population: 511 cells/uL (ref 200–950)
WBC: 7.4 10*3/uL (ref 3.8–10.8)

## 2018-02-25 NOTE — Progress Notes (Signed)
Subjective:    Patient ID: Nancy [name], female    DOB: Feb 27, 1991, 27 y.o.   MRN: [id number]  HPI Patient is a 27 year old African-American female with cerebral palsy as well as spastic quadriplegia involving all 4 extremities and mild contractures in her upper extremities at the elbow.  She is here today accompanied by her father requesting a bed.  She is currently living at home in a regular bed.  It is difficult for her father to feed her in bed and to position her in a safe way to feed her.  They are using numerous pillows however occasionally she gets choked while being fed.  Furthermore it is very difficult for the father to get her out of bed.  He is essentially having to dead lift her from a prone position to place her in her wheelchair.  With electronic hospital bed, it would facilitate his ability to remove her from the bed and transition her into her wheelchair.  She is wheelchair-bound throughout the day and this would facilitate transfer.  It would also facilitate helping to get her dressed in the morning as well as assisting with ADLs such as when she needs to go from the bed to the bathroom or to the potty chair.  Furthermore a hospital bed would allow the use of a trapeze or other assist devices so that she could help position herself in bed Past Medical History:  Diagnosis Date  . Anemia    h/o iron deficiency  . CP (cerebral palsy) (HCC)    with mild contracture  . IUD (intrauterine device) in place 10/2012  . Menorrhagia with regular cycle 11/11/2012  . Mobility impaired 11/11/2012  . Mood disorder (HCC)   . MR (mental retardation)   . Peripheral vascular disease (HCC) 10/02/2006   bilateral DVT lower legs  . Seizures (HCC)    childhood  . Wheelchair bound    r/t CP   Past Surgical History:  Procedure Laterality Date  . EYE SURGERY     as a child  . HIP SURGERY Right 2003  . INTRAUTERINE DEVICE (IUD) INSERTION N/A 11/12/2012   Procedure: INTRAUTERINE DEVICE  (IUD) INSERTION;  Surgeon: Sherron MondayJody Bovard, MD;  Location: [facility];  Service: Gynecology;  Laterality: N/A;  . LEG SURGERY     as a child  . MULTIPLE TOOTH EXTRACTIONS  10/18/10   Current Outpatient Medications on File Prior to Visit  Medication Sig Dispense Refill  . clonazePAM (KLONOPIN) 1 MG tablet TAKE ONE TABLET EVERY MORNING 30 tablet 2  . glycopyrrolate (ROBINUL) 1 MG tablet Take 1 tablet (1 mg total) by mouth 2 (two) times daily. 60 tablet 0  . levonorgestrel (MIRENA) 20 MCG/24HR IUD 1 each by Intrauterine route once.     No current facility-administered medications on file prior to visit.    No Known Allergies Social History   Socioeconomic History  . Marital status: Single    Spouse name: Not on file  . Number of children: Not on file  . Years of education: Not on file  . Highest education level: Not on file  Occupational History  . Not on file  Social Needs  . Financial resource strain: Not on file  . Food insecurity:    Worry: Not on file    Inability: Not on file  . Transportation needs:    Medical: Not on file    Non-medical: Not on file  Tobacco Use  . Smoking status: Never Smoker  .  Smokeless tobacco: Never Used  Substance and Sexual Activity  . Alcohol use: No    Comment: Pt denies  . Drug use: No    Comment: Pt denies  . Sexual activity: Never    Comment: lives in a group home.  single.  Lifestyle  . Physical activity:    Days per week: Not on file    Minutes per session: Not on file  . Stress: Not on file  Relationships  . Social connections:    Talks on phone: Not on file    Gets together: Not on file    Attends religious service: Not on file    Active member of club or organization: Not on file    Attends meetings of clubs or organizations: Not on file    Relationship status: Not on file  . Intimate partner violence:    Fear of current or ex partner: Not on file    Emotionally abused: Not on file    Physically abused: Not on file    Forced  sexual activity: Not on file  Other Topics Concern  . Not on file  Social History Narrative   Lives in an apartment with [facility] (assisted living)   Left handed      Review of Systems  All other systems reviewed and are negative.      Objective:   Physical Exam  Constitutional: She is oriented to person, place, and time. She appears well-developed and well-nourished.  Cardiovascular: Normal rate, regular rhythm and normal heart sounds.  Exam reveals no gallop and no friction rub.   No murmur heard. Pulmonary/Chest: Effort normal and breath sounds normal. No respiratory distress. She has no wheezes. She has no rales.  Abdominal: Soft. Bowel sounds are normal.  Neurological: She is alert and oriented to person, place, and time. She exhibits abnormal muscle tone. Coordination abnormal.  Reflex Scores:      Tricep reflexes are 3+ on the right side and 3+ on the left side.      Bicep reflexes are 3+ on the right side and 3+ on the left side.      Brachioradialis reflexes are 3+ on the right side and 3+ on the left side. Vitals reviewed.     Patient has contractures in both upper extremities at the elbow.  She sits in a fully flexed position with her arms drawn up to her chest.  Passively I have a difficult time extending her elbows.  Legs show significant muscle atrophy.  She is barely able to flex her hips on command.  There is contractures of both knees and also inversion of both ankles. Assessment & Plan:   Spastic quadriplegia (HCC) - Plan: CBC with Differential/Platelet, COMPLETE METABOLIC PANEL WITH GFR  I will try to have the patient arrange to have a hospital bed in her home.  This will facilitate the patient eating safely in bed.  It would also facilitate the patient's family helping to position her in bed, transition her from a bed to her wheelchair, assist in ADLs such as bathing in the bed sponge baths, toileting, etc.

## 2018-02-26 ENCOUNTER — Encounter: Payer: Self-pay | Admitting: Family Medicine

## 2018-03-01 ENCOUNTER — Other Ambulatory Visit: Payer: Self-pay | Admitting: Family Medicine

## 2018-03-03 NOTE — Telephone Encounter (Signed)
Ok to refill??  Last office visit 02/25/2018.  Last refill 12/12/2017, #2 refills.

## 2018-03-10 DIAGNOSIS — H5032 Intermittent alternating esotropia: Secondary | ICD-10-CM | POA: Diagnosis not present

## 2018-03-10 DIAGNOSIS — G809 Cerebral palsy, unspecified: Secondary | ICD-10-CM | POA: Diagnosis not present

## 2018-03-10 DIAGNOSIS — H5203 Hypermetropia, bilateral: Secondary | ICD-10-CM | POA: Diagnosis not present

## 2018-05-26 ENCOUNTER — Encounter: Payer: Self-pay | Admitting: Family Medicine

## 2018-05-26 ENCOUNTER — Ambulatory Visit (INDEPENDENT_AMBULATORY_CARE_PROVIDER_SITE_OTHER): Payer: Medicare Other | Admitting: Family Medicine

## 2018-05-26 VITALS — BP 120/62 | HR 86 | Temp 98.2°F | Resp 18

## 2018-05-26 DIAGNOSIS — Z23 Encounter for immunization: Secondary | ICD-10-CM | POA: Diagnosis not present

## 2018-05-26 DIAGNOSIS — K117 Disturbances of salivary secretion: Secondary | ICD-10-CM | POA: Diagnosis not present

## 2018-05-26 DIAGNOSIS — K5909 Other constipation: Secondary | ICD-10-CM | POA: Diagnosis not present

## 2018-05-26 DIAGNOSIS — Z0000 Encounter for general adult medical examination without abnormal findings: Secondary | ICD-10-CM | POA: Diagnosis not present

## 2018-05-26 DIAGNOSIS — G8250 Quadriplegia, unspecified: Secondary | ICD-10-CM | POA: Diagnosis not present

## 2018-05-26 DIAGNOSIS — Z13220 Encounter for screening for lipoid disorders: Secondary | ICD-10-CM | POA: Diagnosis not present

## 2018-05-26 DIAGNOSIS — G253 Myoclonus: Secondary | ICD-10-CM

## 2018-05-26 DIAGNOSIS — F259 Schizoaffective disorder, unspecified: Secondary | ICD-10-CM | POA: Diagnosis not present

## 2018-05-26 MED ORDER — GLYCOPYRROLATE 1 MG PO TABS: 1.0000 mg | ORAL_TABLET | Freq: Every day | ORAL | 0 refills | Status: DC

## 2018-05-26 MED ORDER — KETOCONAZOLE 2 % EX SHAM: 1.0000 "application " | MEDICATED_SHAMPOO | CUTANEOUS | 5 refills | Status: DC

## 2018-05-26 NOTE — Progress Notes (Signed)
Subjective:    Patient ID: Nancy [name], female    DOB: 16-Jun-1991, 27 y.o.   MRN: [id number]  HPI Patient is a 27 year old African-American female with cerebral palsy as well as spastic quadriplegia involving all 4 extremities and mild contractures in her upper extremities at the elbow.  She is here today for complete physical exam.  They are also requesting a hospital bed.  They had also requested a hospital bed at her last visit.  At that time she was living at home with her father.  She is now moved to a group home.  Today at the visit she is accompanied by the director of the group home along with her father.  She certainly qualifies for a hospital bed as we stated at her last visit.  It is difficult for her caregivers to feed her in bed and to position her in a safe way to feed her.  They are using numerous pillows however occasionally she gets choked while being fed.  Furthermore it is very difficult for the caregivers to get her out of bed.   With electronic hospital bed, it would facilitate removing her from the bed and transitioning her into her wheelchair.  She is wheelchair-bound throughout the day and this would facilitate transfer.  It would also facilitate helping to get her dressed in the morning as well as assisting with ADLs such as when she needs to go from the bed to the bathroom or to the potty chair.  Furthermore a hospital bed would allow the use of a trapeze or other assist devices so that she could help position herself in bed.  Therefore I agree it is medically necessary for her to have a hospital bed whether she is at home whether she is at a group home.  Today her caregiver mentions that she is sweating excessively on her feet at night.  She also drools.  Previously she has been on glycopyrrolate 1 mg twice daily however she discontinued that medication due to constipation and sedation.  I explained that the medications for drooling and sweating can cause constipation  and sedation and therefore she declined this.  They are also requesting physical therapy.  The patient has contractures and spastic quadriplegia in all 4 extremities however recently she was able to stand exiting a van and pivot and sit in a wheelchair.  They are interested to see if physical therapy can help strengthen her muscles to facilitate transfer from a bed to a chair or from a chair to a wheelchair and also help improve her contractures. Past Medical History:  Diagnosis Date  . Anemia    h/o iron deficiency  . CP (cerebral palsy) (HCC)    with mild contracture  . IUD (intrauterine device) in place 10/2012  . Menorrhagia with regular cycle 11/11/2012  . Mobility impaired 11/11/2012  . Mood disorder (HCC)   . MR (mental retardation)   . Peripheral vascular disease (HCC) 10/02/2006   bilateral DVT lower legs  . Seizures (HCC)    childhood  . Wheelchair bound    r/t CP   Past Surgical History:  Procedure Laterality Date  . EYE SURGERY     as a child  . HIP SURGERY Right 2003  . INTRAUTERINE DEVICE (IUD) INSERTION N/A 11/12/2012   Procedure: INTRAUTERINE DEVICE (IUD) INSERTION;  Surgeon: Sherron Monday, MD;  Location: [facility];  Service: Gynecology;  Laterality: N/A;  . LEG SURGERY     as a child  .  MULTIPLE TOOTH EXTRACTIONS  10/18/10   Current Outpatient Medications on File Prior to Visit  Medication Sig Dispense Refill  . clonazePAM (KLONOPIN) 1 MG tablet TAKE ONE TABLET EVERY MORNING 30 tablet 2  . levonorgestrel (MIRENA) 20 MCG/24HR IUD 1 each by Intrauterine route once.     No current facility-administered medications on file prior to visit.    No Known Allergies Social History   Socioeconomic History  . Marital status: Single    Spouse name: Not on file  . Number of children: Not on file  . Years of education: Not on file  . Highest education level: Not on file  Occupational History  . Not on file  Social Needs  . Financial resource strain: Not on file  . Food  insecurity:    Worry: Not on file    Inability: Not on file  . Transportation needs:    Medical: Not on file    Non-medical: Not on file  Tobacco Use  . Smoking status: Never Smoker  . Smokeless tobacco: Never Used  Substance and Sexual Activity  . Alcohol use: No    Comment: Pt denies  . Drug use: No    Comment: Pt denies  . Sexual activity: Never    Comment: lives in a group home.  single.  Lifestyle  . Physical activity:    Days per week: Not on file    Minutes per session: Not on file  . Stress: Not on file  Relationships  . Social connections:    Talks on phone: Not on file    Gets together: Not on file    Attends religious service: Not on file    Active member of club or organization: Not on file    Attends meetings of clubs or organizations: Not on file    Relationship status: Not on file  . Intimate partner violence:    Fear of current or ex partner: Not on file    Emotionally abused: Not on file    Physically abused: Not on file    Forced sexual activity: Not on file  Other Topics Concern  . Not on file  Social History Narrative   Lives in an apartment with [facility] (assisted living)   Left handed      Review of Systems  All other systems reviewed and are negative.      Objective:   Physical Exam  Constitutional: She is oriented to person, place, and time. She appears well-developed and well-nourished.  Cardiovascular: Normal rate, regular rhythm and normal heart sounds.  Exam reveals no gallop and no friction rub.   No murmur heard. Pulmonary/Chest: Effort normal and breath sounds normal. No respiratory distress. She has no wheezes. She has no rales.  Abdominal: Soft. Bowel sounds are normal.  Neurological: She is alert and oriented to person, place, and time. She exhibits abnormal muscle tone. Coordination abnormal.  Reflex Scores:      Tricep reflexes are 3+ on the right side and 3+ on the left side.      Bicep reflexes are 3+ on the right side and 3+  on the left side.      Brachioradialis reflexes are 3+ on the right side and 3+ on the left side. Vitals reviewed.     Patient has contractures in both upper extremities at the elbow.  She sits in a fully flexed position with her arms drawn up to her chest.  Passively I have a difficult time extending her elbows.  Legs show significant muscle atrophy.  She is barely able to flex her hips on command.  There is contractures of both knees and also inversion of both ankles. Assessment & Plan:   General medical exam - Plan: CBC with Differential/Platelet, COMPLETE METABOLIC PANEL WITH GFR, Lipid panel, TSH  Spastic quadriplegia (HCC)  Chronic constipation - Plan: TSH  Drooling  Multifocal myoclonus  Schizoaffective disorder, unspecified type (HCC)  Need for prophylactic vaccination and inoculation against influenza - Plan: Flu Vaccine QUAD 36+ mos IM  I will try to have the patient arrange to have a hospital bed at her residence.  We previously filled out forms for her to have it at home.  However I am unaware if the patient actually got the hospital bed at home.  Therefore we will contact the home health care agency regarding her hospital bed.  This will facilitate the patient eating safely in bed.  It would also facilitate the patient caregivers helping to position her in bed, transition her from a bed to her wheelchair, assist in ADLs such as bathing in the bed sponge baths, toileting, etc. she is due for a flu shot today.  We will check a CBC, CMP, TSH, fasting lipid panel.  We will also consult physical therapy as requested to help with transitioning and also help with strengthening her muscles and hopefully helping reduce her contractures.  I have recommended against glycopyrrolate.  She also appears to have seborrheic dermatitis in her hairline around her forehead, her ears, and her eyebrows.  I have recommended Nizoral 2% shampoo to be applied twice a week for 10 minutes and then rinsed  out.

## 2018-05-27 ENCOUNTER — Encounter: Payer: Self-pay | Admitting: Family Medicine

## 2018-05-27 LAB — CBC WITH DIFFERENTIAL/PLATELET
BASOS PCT: 0.7 %
Basophils Absolute: 48 cells/uL (ref 0–200)
EOS PCT: 0.1 %
Eosinophils Absolute: 7 cells/uL — ABNORMAL LOW (ref 15–500)
HCT: 45 % (ref 35.0–45.0)
Hemoglobin: 15.2 g/dL (ref 11.7–15.5)
Lymphs Abs: 1584 cells/uL (ref 850–3900)
MCH: 31.7 pg (ref 27.0–33.0)
MCHC: 33.8 g/dL (ref 32.0–36.0)
MCV: 93.9 fL (ref 80.0–100.0)
MONOS PCT: 5.7 %
MPV: 10.5 fL (ref 7.5–12.5)
Neutro Abs: 4774 cells/uL (ref 1500–7800)
Neutrophils Relative %: 70.2 %
PLATELETS: 270 10*3/uL (ref 140–400)
RBC: 4.79 10*6/uL (ref 3.80–5.10)
RDW: 12 % (ref 11.0–15.0)
TOTAL LYMPHOCYTE: 23.3 %
WBC mixed population: 388 cells/uL (ref 200–950)
WBC: 6.8 10*3/uL (ref 3.8–10.8)

## 2018-05-27 LAB — COMPLETE METABOLIC PANEL WITH GFR
AG Ratio: 1.4 (calc) (ref 1.0–2.5)
ALKALINE PHOSPHATASE (APISO): 43 U/L (ref 33–115)
ALT: 10 U/L (ref 6–29)
AST: 18 U/L (ref 10–30)
Albumin: 4.5 g/dL (ref 3.6–5.1)
BILIRUBIN TOTAL: 0.6 mg/dL (ref 0.2–1.2)
BUN: 8 mg/dL (ref 7–25)
CO2: 22 mmol/L (ref 20–32)
CREATININE: 0.65 mg/dL (ref 0.50–1.10)
Calcium: 9.7 mg/dL (ref 8.6–10.2)
Chloride: 106 mmol/L (ref 98–110)
GFR, EST AFRICAN AMERICAN: 140 mL/min/{1.73_m2} (ref >=60)
GFR, Est Non African American: 121 mL/min/{1.73_m2} (ref >=60)
GLOBULIN: 3.3 g/dL (ref 1.9–3.7)
Glucose, Bld: 91 mg/dL (ref 65–99)
Potassium: 4.7 mmol/L (ref 3.5–5.3)
SODIUM: 141 mmol/L (ref 135–146)
TOTAL PROTEIN: 7.8 g/dL (ref 6.1–8.1)

## 2018-05-27 LAB — LIPID PANEL
CHOL/HDL RATIO: 3.4 (calc) (ref <5.0)
CHOLESTEROL: 149 mg/dL (ref <200)
HDL: 44 mg/dL — AB (ref >50)
LDL CHOLESTEROL (CALC): 94 mg/dL
Non-HDL Cholesterol (Calc): 105 mg/dL (calc) (ref <130)
TRIGLYCERIDES: 39 mg/dL (ref <150)

## 2018-05-27 LAB — TSH: TSH: 0.43 m[IU]/L

## 2018-05-30 ENCOUNTER — Other Ambulatory Visit: Payer: Self-pay | Admitting: Family Medicine

## 2018-05-30 MED ORDER — CLONAZEPAM 1 MG PO TABS: 1.0000 mg | ORAL_TABLET | Freq: Every morning | ORAL | 2 refills | Status: DC

## 2018-06-20 ENCOUNTER — Telehealth: Payer: Self-pay | Admitting: Rehabilitative and Restorative Service Providers"

## 2018-06-20 ENCOUNTER — Encounter: Payer: Self-pay | Admitting: Rehabilitative and Restorative Service Providers"

## 2018-06-20 ENCOUNTER — Ambulatory Visit: Payer: Medicare Other | Attending: Family Medicine | Admitting: Rehabilitative and Restorative Service Providers"

## 2018-06-20 DIAGNOSIS — R293 Abnormal posture: Secondary | ICD-10-CM | POA: Insufficient documentation

## 2018-06-20 DIAGNOSIS — M6249 Contracture of muscle, multiple sites: Secondary | ICD-10-CM

## 2018-06-20 DIAGNOSIS — M6281 Muscle weakness (generalized): Secondary | ICD-10-CM

## 2018-06-20 NOTE — Telephone Encounter (Signed)
Dr. Tanya Nones,  Nancy [name] was evaluated today by PT.  I have left a message with her case manager to determine what equipment needs we can address (some of her home equipment is at her father's house and is not at her assisted living provider's home).    I also think Nancy [name] would benefit from further medical tone management due to muscle spasticity, spasms that limit function,  and spasms that limit caregiver assist.    She also would benefit from OT for UE HEP instruction and possibly splint fabrication to reduce further joint contracture.  If you agree, please place an order in [facility] workque in [facility] or fax the order to [phone].  Due to multiple tone mgmt and equipment needs, she may benefit from referral to physiatrist (Dr. Riley Kill or Dr. Wynn Banker) or a neurologist (She saw Dr. Anne Hahn 05/2017).   Thank you for the referral of this patient. Margretta Ditty, MPT

## 2018-06-20 NOTE — Telephone Encounter (Signed)
PT left voice message for [name], case manager.  Phone # was provided by patient with her giving consent for PT to reach out to case manager.   Reason for PT call is: 1) To figure out equipment needs:  Patient is at assisted living provider's home, however Dad is legal guardian.  Patient is using an old manual w/c today with bungee cord to hold her in chair.  She is in need of a: [facility] bed Handheld shower faucet New manual w/c ?unsure of condition of power w/c  *Patient's father has some of her equipment at his house in the garage, but shape of that equipment is currently unknown.  She needs equipment to support her in current environment.  Will await call back--Hagan Vanauken, PT

## 2018-06-20 NOTE — Telephone Encounter (Signed)
PT contacted [employer] as equipment company provided by the patient.  PT attempting to find out age of recently ordered equipment to determine if we can get new equipment for assisted family provider's home environment.  Spoke with rep at [employer].  [employer]ride, Edge 2     02/02/2015  Manual w/c on record from 03/23/2009  The last contact with [employer] the patient had was for a repair in 05/2017.    PT to work with case manager to determine what equipment we can get to improve mobility in home/community.  Daryon Remmert, PT

## 2018-06-20 NOTE — Therapy (Signed)
[facility] [address] [place], [place], [address] Phone: [phone]   Fax:  [phone]  Physical Therapy Evaluation  Patient Details  Name: Nancy [name] MRN: [id number] Date of Birth: 19-Feb-1991 Referring Provider (PT): Lynnea Ferrier, MD   Encounter Date: 06/20/2018  PT End of Session - 06/20/18 1604    Visit Number  1    Number of Visits  17    Date for PT Re-Evaluation  08/19/18    Authorization Type  medicare    PT Start Time  0850    PT Stop Time  0935    PT Time Calculation (min)  45 min    Equipment Utilized During Treatment  Gait belt    Activity Tolerance  Patient tolerated treatment well    Behavior During Therapy  [facility] for tasks assessed/performed       Past Medical History:  Diagnosis Date  . Anemia    h/o iron deficiency  . CP (cerebral palsy) (HCC)    with mild contracture  . IUD (intrauterine device) in place 10/2012  . Menorrhagia with regular cycle 11/11/2012  . Mobility impaired 11/11/2012  . Mood disorder (HCC)   . MR (mental retardation)   . Peripheral vascular disease (HCC) 10/02/2006   bilateral DVT lower legs  . Seizures (HCC)    childhood  . Wheelchair bound    r/t CP    Past Surgical History:  Procedure Laterality Date  . EYE SURGERY     as a child  . HIP SURGERY Right 2003  . INTRAUTERINE DEVICE (IUD) INSERTION N/A 11/12/2012   Procedure: INTRAUTERINE DEVICE (IUD) INSERTION;  Surgeon: Sherron Monday, MD;  Location: [facility];  Service: Gynecology;  Laterality: N/A;  . LEG SURGERY     as a child  . MULTIPLE TOOTH EXTRACTIONS  10/18/10    There were no vitals filed for this visit.   Subjective Assessment - 06/20/18 0853    Subjective  The patient lives with an assisted family living provider (AFL) who reports Nancy [name] wants to be able to use her UEs to position herself upright and to help more with transfers.  Her AFL provider is picking her up to get her into/out of  car.  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  Nancy [name] goes to [employer] day program and gets home around 2:30-3:00 in the afternoon.  She works Tues/Thurs from [employer].      Patient is accompained by:  Family member   Nancy [name] is AFL provider   Patient Stated Goals  Get a stretching program/strengthening, help more with transfers, address equipment needs.     Currently in Pain?  No/denies   PT inquired about pain, caregiver notes she doesn't think Nancy [name] tells her if she hurts.  PT looked fo rnonverbals during evaluation.        [facility] PT Assessment - 06/20/18 0858      Assessment   Medical Diagnosis  Spastic Quadriplegia    Referring Provider (PT)  Lynnea Ferrier, MD    Onset Date/Surgical Date  05/26/18    Hand Dominance  Left    Prior Therapy  none at this clinic- unable to answer about previous therapy      Precautions   Precautions  Fall    Precaution Comments  Patient has high tone and leans posteriorly making transfers challenging      Restrictions   Weight Bearing Restrictions  No      Balance Screen   Has the  patient fallen in the past 6 months  --   Needs help for all mobility- has fall risk due to tone/defic     Home Environment   Living Environment  Assisted living   Lives in assisted home setting   Home Equipment  Wheelchair - manual;Wheelchair - power;Tub bench   Needs:  handheld shower/faucet/ Nancy [name]   Additional Comments  Patient has other equipment needs including:  hospital bed, shower attachment for handheld device  Patient may also need a hoyer lift and new w/c.  Equipment needs are challenging due to Father is Legal Guardian and patient living at a supported family home that cannot access all of her prior equipment.        Prior Function   Level of Independence  Needs assistance with ADLs;Needs assistance with transfers;Needs assistance with homemaking    Vocation  Part time employment    Vocation Requirements  Patient works Tues/Thurs and  goes to day program M,W,F.      Cognition   Overall Cognitive Status  Difficult to assess   slowed response to questions     Posture/Postural Control   Posture/Postural Control  Postural limitations    Posture Comments  Knees swept to the right, hips to the left.  Shorterning through trunk on the left.        Tone   Assessment Location  Right Upper Extremity;Left Upper Extremity;Right Lower Extremity;Left Lower Extremity      ROM / Strength   AROM / PROM / Strength  AROM;PROM;Strength      AROM   Overall AROM Comments  Minimal AROM in LEs L more restricted than R.  Movement in LEs in not functional.  UE - patient can reach overhead in supine with increased time and has to move through flexor tone in biceps.  She can straighten L elbow further than R elbow.  She is able to hold a cup to use a straw and tries to eat with a spoon with assist.      PROM   Overall PROM Comments  L hip flexes PROM to 60 degrees, R hip to 90 degrees.  Maintains bilat knee flexion to approximately 40 deegrees.  R elbow stays in flexed position with passive overpressure.      Strength   Overall Strength Comments  Patient can lift R LE against gravity.  significant muscle tone limits all LE movements.  UEs have movement overhead with increased muscle tone limiting function.      Flexibility   Soft Tissue Assessment /Muscle Length  yes   tigthness t/o bilat LEs     Bed Mobility   Bed Mobility  Rolling Right;Rolling Left;Right Sidelying to Sit;Left Sidelying to Sit;Supine to Sit;Sit to Supine    Rolling Right  Total Assistance - Patient < 25%    Rolling Left  Total Assistance - Patient < 25%    Right Sidelying to Sit  Total Assistance - Patient < 25%    Left Sidelying to Sit  Total Assistance - Patient < 25%    Supine to Sit  Total Assistance - Patient < 25%    Sit to Supine  Total Assistance - Patient < 25%      Transfers   Transfers  Squat Pivot Transfers;Lateral/Scoot Scientist, research (life sciences) Transfers   1: +1 Total assist    Squat Pivot Transfer Details (indicate cue type and reason)  PT moves patient anteriorly and lifts patient to pivot w/c<>mat.    Lateral/Scoot Transfers  1: +1 Total assist    Lateral/Scoot Transfer Details (indicate cue type and reason)  Attepted to initiate a sliding board transfer, however the patient's muscle tone pulls her posteriorly leading to hips sliding forward.  Therefore, changed to squat pivot transfer.      Ambulation/Gait   Ambulation/Gait  --   unable     Balance   Balance Assessed  Yes      Static Sitting Balance   Static Sitting - Balance Support  Left upper extremity supported   R UE did not reach to mat   Static Sitting - Level of Assistance  2: Max assist    Static Sitting - Comment/# of Minutes  Patient is pulled posteriorly due to muscle tone.      RUE Tone   RUE Tone  Modified Ashworth      RUE Tone   Modified Ashworth Scale for Grading Hypertonia RUE  More marked increase in muscle tone through most of the ROM, but affected part(s) easily moved      LUE Tone   LUE Tone  Modified Ashworth      LUE Tone   Modified Ashworth Scale for Grading Hypertonia LUE  Slight increase in muscle tone, manifested by a catch, followed by minimal resistance throughout the remainder (less than half) of the ROM      RLE Tone   RLE Tone  Modified Ashworth      RLE Tone   Modified Ashworth Scale for Grading Hypertonia RLE  Considerable increase in muschle tone, passive movement difficult      LLE Tone   LLE Tone  Modified Ashworth      LLE Tone   Modified Ashworth Scale for Grading Hypertonia LLE  Affected part(s) rigid in flexion or extension                Objective measurements completed on examination: See above findings.              PT Education - 06/20/18 1603    Education Details  discussed PT goals and equipment needs    Person(s) Educated  Patient;Caregiver(s)    Methods  Explanation    Comprehension  Verbalized  understanding       PT Short Term Goals - 06/20/18 1607      PT SHORT TERM GOAL #1   Title  The patient will have LMN with equipment needs addressed and sent to case manager    Time  4    Period  Weeks    Target Date  07/20/18      PT SHORT TERM GOAL #2   Title  The patient will maintain sitting edge of mat with UE support x 1 minute.    Time  4    Period  Weeks    Target Date  07/20/18      PT SHORT TERM GOAL #3   Title  The patient's caregiver will have initial home stretching program for LE tightness.    Time  4    Period  Weeks    Target Date  07/20/18      PT SHORT TERM GOAL #4   Title  The patient will have equipment needs further assessed.    Time  4    Period  Weeks    Target Date  07/20/18        PT Long Term Goals - 06/20/18 1608      PT LONG TERM GOAL #1   Title  The  patient will perform home stretching with caregiver assistance.    Time  8    Period  Weeks    Target Date  08/19/18      PT LONG TERM GOAL #2   Title  The patient will maintain sitting edge of mat x 2 minutes wiht UE support.    Time  8    Period  Weeks    Target Date  08/19/18      PT LONG TERM GOAL #3   Title  Further LTGs to follow based on patient progress.             Plan - 06/20/18 1610    Clinical Impression Statement  The patient is a 27 yo female with chornic deficits from spastic quadriplegia CP.  She presents with impairments in muscle length with multiple joint contractures (R elbow, bilateral hips, bilateral knees).  She has decreased sitting balance due to hypertonicity and weakness.  The patient has postural shortening through the L side of the trunk.  The patient's muscle spasms and increased muscle tone hinder all mobility and hinder caregiver assist for toileting and dressing due to tightness.  PT to f/u with MD re: possible interventions for muscle tone and equipment needs + OT order for splints to reduce further muscle shortening.    History and Personal Factors  relevant to plan of care:  spastic CP, h/o epilepsy    Clinical Presentation  Evolving    Clinical Presentation due to:  increasing tightness    Clinical Decision Making  Moderate    Rehab Potential  Good    PT Frequency  2x / week   + eval   PT Duration  8 weeks    PT Treatment/Interventions  ADLs/Self Care Home Management;Balance training;Neuromuscular re-education;Patient/family education;Functional mobility training;Therapeutic activities;Therapeutic exercise;DME Instruction;Manual techniques    PT Next Visit Plan  Establish home stretching program, discuss equipment needs for LMN for home.    Recommended Other Services  MD for tone mgmt, OT for UE splints to reduce future contractures    Consulted and Agree with Plan of Care  Patient       Patient will benefit from skilled therapeutic intervention in order to improve the following deficits and impairments:  Impaired tone, Decreased activity tolerance, Decreased knowledge of use of DME, Decreased strength, Postural dysfunction, Impaired flexibility, Decreased range of motion, Increased muscle spasms, Decreased mobility, Decreased balance  Visit Diagnosis: Muscle weakness (generalized)  Abnormal posture  Contracture of muscle, multiple sites     Problem List Patient Active Problem List   Diagnosis Date Noted  . SIRS (systemic inflammatory response syndrome) (HCC) 08/09/2016  . Nausea vomiting and diarrhea 08/09/2016  . Fecal impaction (HCC) 08/09/2016  . Psychosis (HCC) 11/23/2013  . Paranoia (HCC) 11/22/2013  . Fall from wheelchair 05/29/2013  . Anemia   . 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 Myoclonus 12/23/2012  . 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 Insomnia, unspecified 12/23/2012  . Menorrhagia with regular cycle 11/11/2012  . Mobility impaired 11/11/2012  .  Wheelchair bound 11/11/2012    Kena Limon , PT 06/20/2018, 4:14 PM  [facility] [address] [place], [place], [address] Phone: [phone]   Fax:  [phone]  Name: KHUSHBU PIPPEN MRN: [id number] Date of Birth: Mar 09, 1991

## 2018-06-22 IMAGING — DX DG ABDOMEN 1V
1 series · 1 of 1 positions shown · non-contrast
Comparison: Abdominal CT scan August 09, 2016

CLINICAL DATA: Fecal impaction

EXAM:
ABDOMEN - 1 VIEW

[abdomen kub]
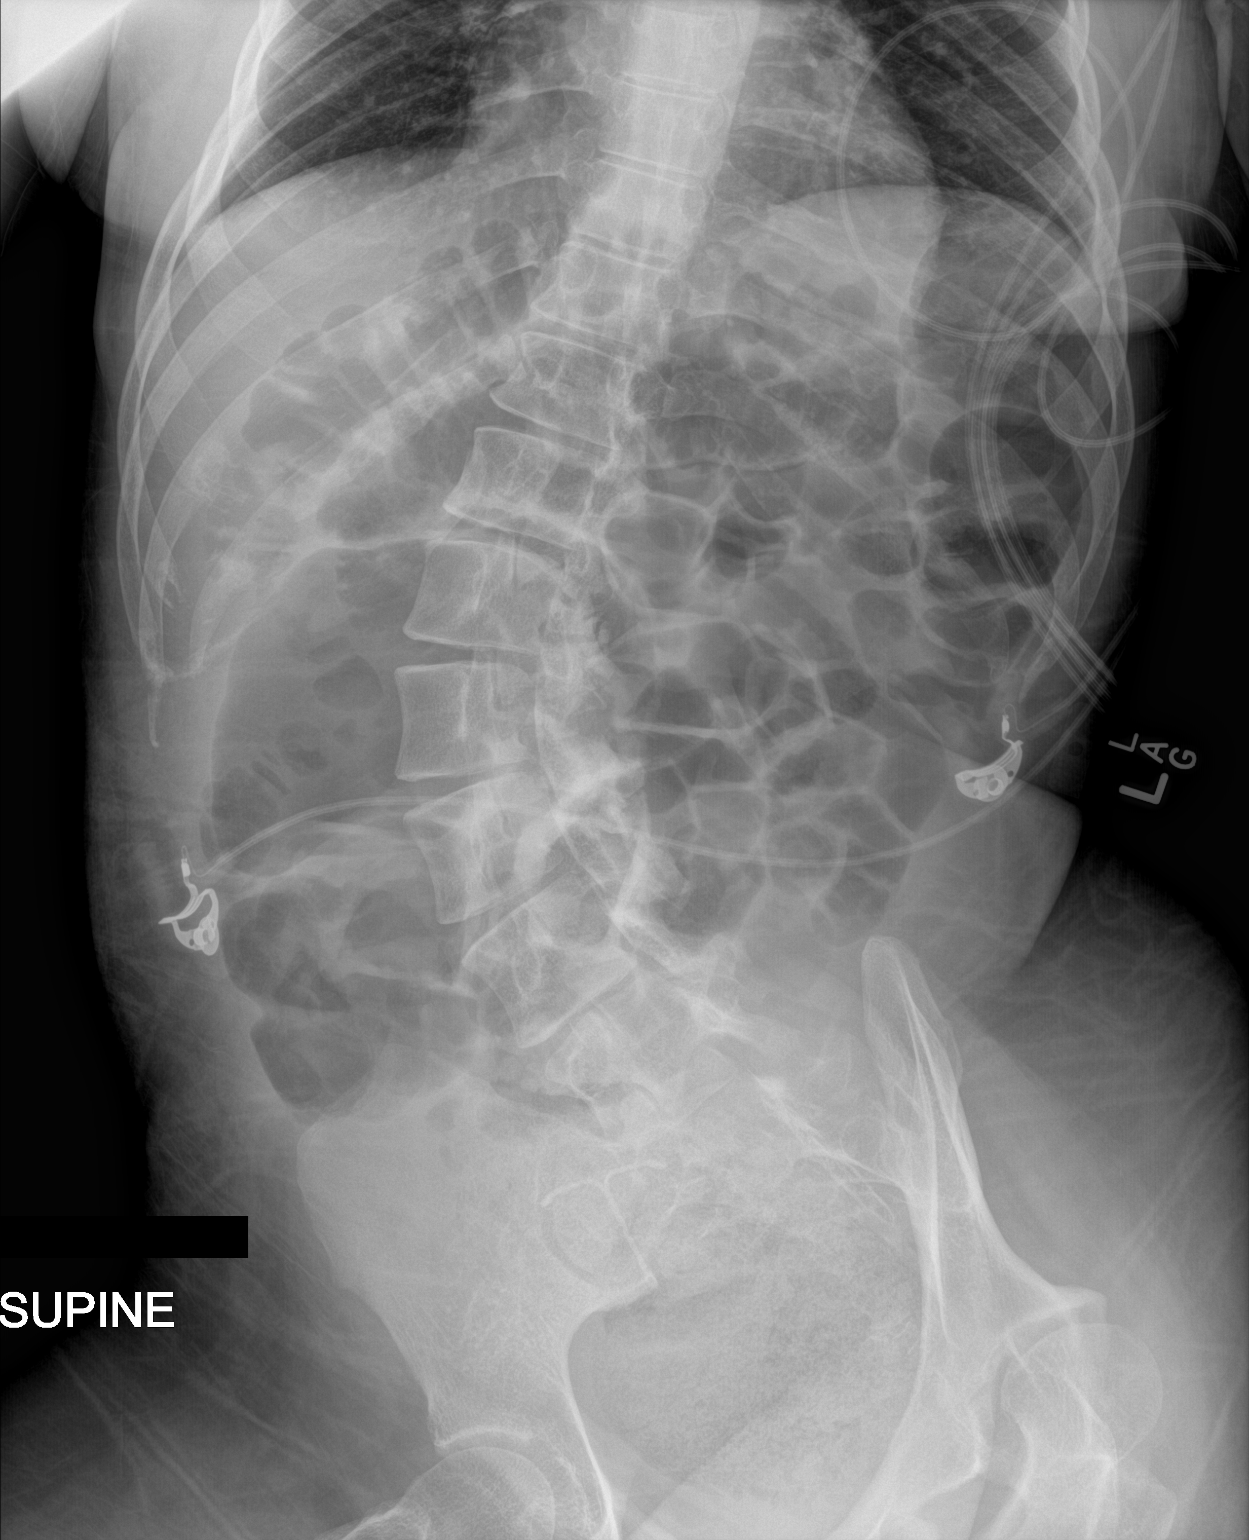

[1 of 1 positions shown; findings below may reference images not displayed]

FINDINGS: There is a large amount of stool in the rectum. Proximal to this the
colon and portions of the small bowel exhibit moderate. No free
extraluminal gas is observed. There is severe lumbar scoliosis
convex toward the right with a rotatory component. Gaseous
distention.
IMPRESSION: Fecal impaction. Moderate gaseous distention of bowel proximal to
the rectum.

## 2018-06-23 IMAGING — DX DG ABDOMEN 1V
1 series · 1 of 1 positions shown · non-contrast
Comparison: 08/10/2016.

CLINICAL DATA: Abdominal pain.  Fecal impaction.  Cerebral palsy.

EXAM:
ABDOMEN - 1 VIEW

[abdomen kub]
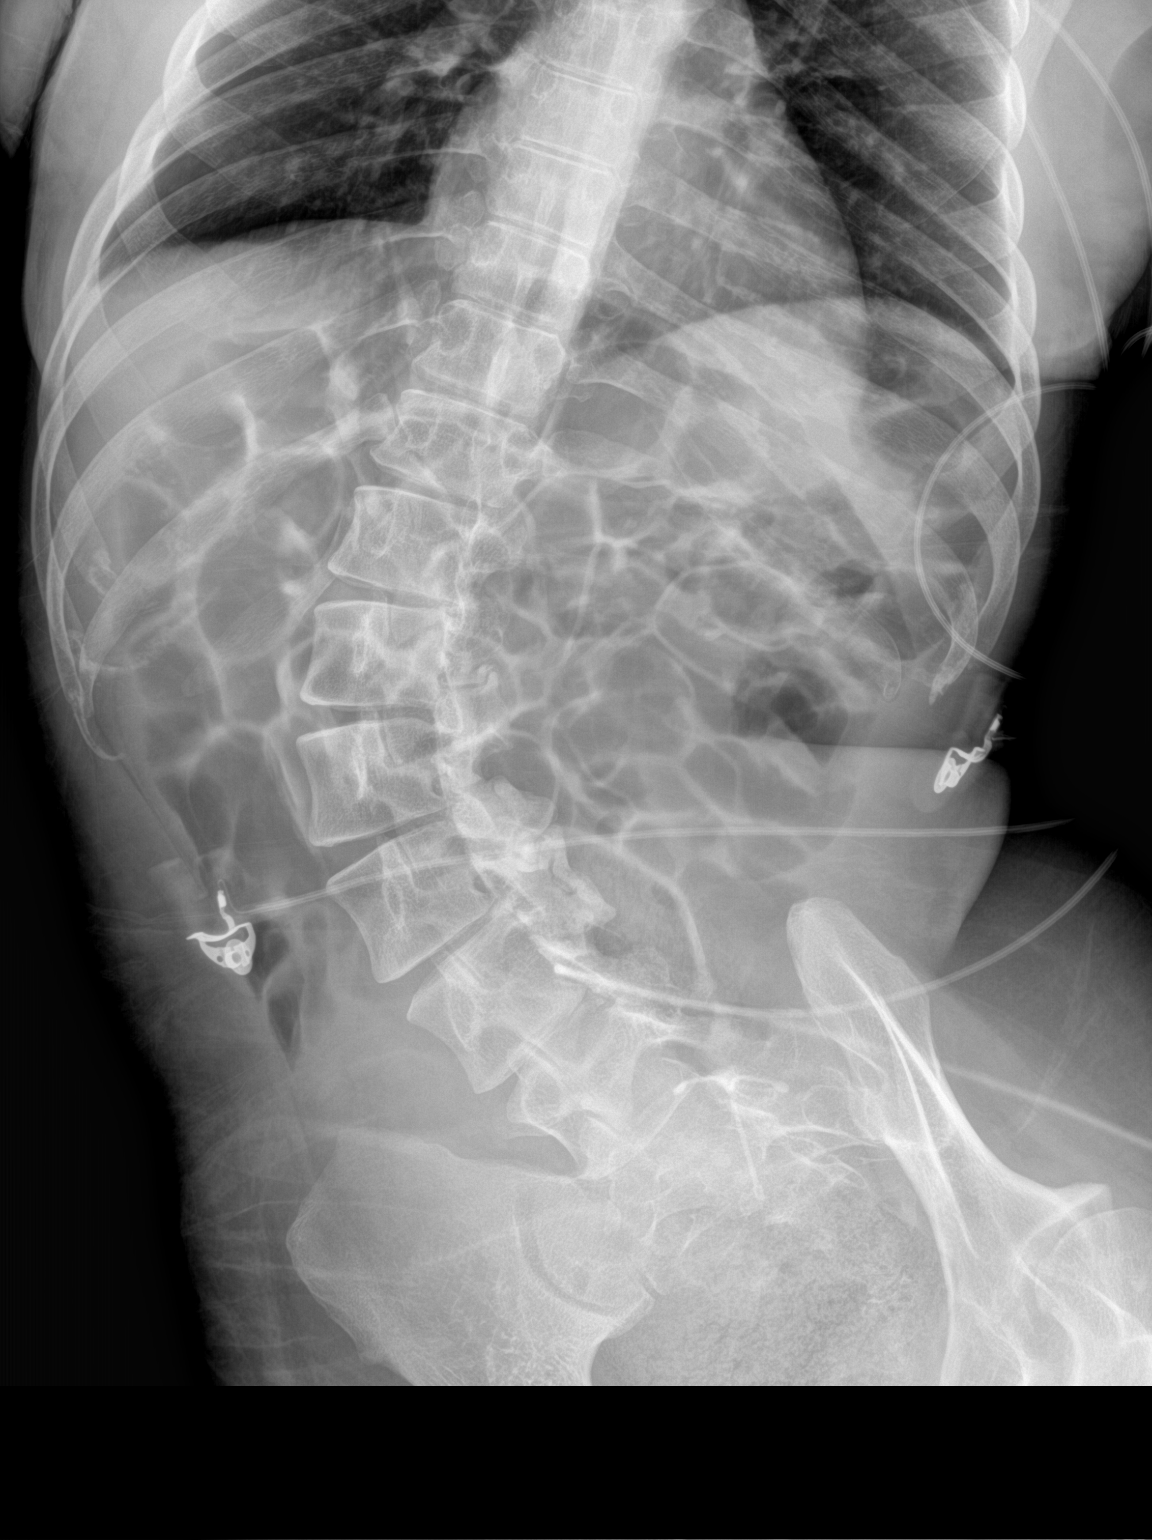

[1 of 1 positions shown; findings below may reference images not displayed]

FINDINGS: Normal caliber gas distended loops of bowel throughout the abdomen.
Large amount of stool in the rectum. Marked dextroconvex
thoracolumbar rotary scoliosis without significant change.
IMPRESSION: Large amount of stool in the rectum, compatible with fecal
impaction.

## 2018-06-23 NOTE — Telephone Encounter (Signed)
Lets get her in to see Dr. Riley Kill (physiatrist) to see what they recommend.  Fine with OT consult.

## 2018-06-24 ENCOUNTER — Ambulatory Visit: Payer: Medicare Other | Admitting: Rehabilitative and Restorative Service Providers"

## 2018-06-24 NOTE — Telephone Encounter (Signed)
Orders placed.

## 2018-06-25 ENCOUNTER — Ambulatory Visit: Payer: Self-pay | Admitting: Rehabilitative and Restorative Service Providers"

## 2018-07-02 ENCOUNTER — Ambulatory Visit: Payer: Medicare Other | Attending: Family Medicine | Admitting: Rehabilitative and Restorative Service Providers"

## 2018-07-02 ENCOUNTER — Telehealth: Payer: Self-pay | Admitting: Rehabilitative and Restorative Service Providers"

## 2018-07-02 ENCOUNTER — Encounter: Payer: Self-pay | Admitting: Rehabilitative and Restorative Service Providers"

## 2018-07-02 DIAGNOSIS — M6249 Contracture of muscle, multiple sites: Secondary | ICD-10-CM | POA: Insufficient documentation

## 2018-07-02 DIAGNOSIS — M6281 Muscle weakness (generalized): Secondary | ICD-10-CM

## 2018-07-02 DIAGNOSIS — R293 Abnormal posture: Secondary | ICD-10-CM | POA: Diagnosis not present

## 2018-07-02 DIAGNOSIS — G800 Spastic quadriplegic cerebral palsy: Secondary | ICD-10-CM | POA: Insufficient documentation

## 2018-07-02 NOTE — Therapy (Signed)
[facility] [address] [place], [place], [address] Phone: [phone]   Fax:  [phone]  Physical Therapy Treatment  Patient Details  Name: Nancy [name] MRN: [id number] Date of Birth: [birth year] Referring Provider (PT): Lynnea Ferrier, MD   Encounter Date: 07/02/2018  PT End of Session - 07/02/18 1359    Visit Number  2    Number of Visits  17    Date for PT Re-Evaluation  08/19/18    Authorization Type  medicare    PT Start Time  1108    PT Stop Time  1155    PT Time Calculation (min)  47 min    Equipment Utilized During Treatment  Gait belt    Activity Tolerance  Patient tolerated treatment well    Behavior During Therapy  [facility] for tasks assessed/performed       Past Medical History:  Diagnosis Date  . Anemia    h/o iron deficiency  . CP (cerebral palsy) (HCC)    with mild contracture  . IUD (intrauterine device) in place 10/2012  . Menorrhagia with regular cycle 11/11/2012  . Mobility impaired 11/11/2012  . Mood disorder (HCC)   . MR (mental retardation)   . Peripheral vascular disease (HCC) 10/02/2006   bilateral DVT lower legs  . Seizures (HCC)    childhood  . Wheelchair bound    r/t CP    Past Surgical History:  Procedure Laterality Date  . EYE SURGERY     as a child  . HIP SURGERY Right 2003  . INTRAUTERINE DEVICE (IUD) INSERTION N/A 11/12/2012   Procedure: INTRAUTERINE DEVICE (IUD) INSERTION;  Surgeon: Sherron Monday, MD;  Location: [facility];  Service: Gynecology;  Laterality: N/A;  . LEG SURGERY     as a child  . MULTIPLE TOOTH EXTRACTIONS  10/18/10    There were no vitals filed for this visit.  Subjective Assessment - 07/02/18 1357    Subjective  The patient is accompanied today by her AFL and her father.  Her AFL notes she is using pillows to position [name] at night.    Patient is accompained by:  Family member   father present and AFL   Patient Stated Goals  Get a stretching  program/strengthening, help more with transfers, address equipment needs.     Currently in Pain?  No/denies                       [facility] Adult PT Treatment/Exercise - 07/02/18 1400      Bed Mobility   Bed Mobility  Supine to Sit;Sit to Supine    Supine to Sit  Total Assistance - Patient < 25%    Sit to Supine  Total Assistance - Patient < 25%      Transfers   Transfers  Squat Pivot Transfers    Squat Pivot Transfers  1: +1 Total assist      Self-Care   Self-Care  Other Self-Care Comments    Other Self-Care Comments   The patient and AFL and father discussed equipment needs.  Most imminent need is w/c repair.  The patient's AFL wants to discuss repairs with  [employer]- PT initiated call to discuss what repair needs are.  AFL also wants PT to call Dr. Caren Macadam office to find out how order for hospital bed is going-- PT recommended she call office.      Exercises   Exercises  Other Exercises    Other Exercises  HEP: supine hamstring stretches, supine hip adductor stretching, "butterfly" stretching.  Seated quadratus lumborum stretches with min A.              PT Education - 07/02/18 1359    Education Details  caregiver stretching hip adductors, hamstrings, UE A/AROM    Person(s) Educated  Patient;Caregiver(s)    Methods  Explanation;Demonstration;Handout    Comprehension  Verbalized understanding;Returned demonstration       PT Short Term Goals - 06/20/18 1607      PT SHORT TERM GOAL #1   Title  The patient will have LMN with equipment needs addressed and sent to case manager    Time  4    Period  Weeks    Target Date  07/20/18      PT SHORT TERM GOAL #2   Title  The patient will maintain sitting edge of mat with UE support x 1 minute.    Time  4    Period  Weeks    Target Date  07/20/18      PT SHORT TERM GOAL #3   Title  The patient's caregiver will have initial home stretching program for LE tightness.    Time  4    Period  Weeks    Target Date   07/20/18      PT SHORT TERM GOAL #4   Title  The patient will have equipment needs further assessed.    Time  4    Period  Weeks    Target Date  07/20/18        PT Long Term Goals - 06/20/18 1608      PT LONG TERM GOAL #1   Title  The patient will perform home stretching with caregiver assistance.    Time  8    Period  Weeks    Target Date  08/19/18      PT LONG TERM GOAL #2   Title  The patient will maintain sitting edge of mat x 2 minutes wiht UE support.    Time  8    Period  Weeks    Target Date  08/19/18      PT LONG TERM GOAL #3   Title  Further LTGs to follow based on patient progress.            Plan - 07/02/18 1404    Clinical Impression Statement  Today's session focused on establishing HEP and educating caregivers, discussing equipment needs and providing resources to [employer] and Dr. Rosalyn Charters office (Dr. Caren Macadam office was planning to place an order).  PT to c ontinue working ot [employer].     PT Treatment/Interventions  ADLs/Self Care Home Management;Balance training;Neuromuscular re-education;Patient/family education;Functional mobility training;Therapeutic activities;Therapeutic exercise;DME Instruction;Manual techniques    PT Next Visit Plan  Check HEP, discuss LMN fo requipment    Consulted and Agree with Plan of Care  Patient       Patient will benefit from skilled therapeutic intervention in order to improve the following deficits and impairments:  Impaired tone, Decreased activity tolerance, Decreased knowledge of use of DME, Decreased strength, Postural dysfunction, Impaired flexibility, Decreased range of motion, Increased muscle spasms, Decreased mobility, Decreased balance  Visit Diagnosis: Muscle weakness (generalized)  Abnormal posture  Contracture of muscle, multiple sites     Problem List Patient Active Problem List   Diagnosis Date Noted  . SIRS (systemic inflammatory response syndrome) (HCC) 08/09/2016  . Nausea vomiting and  diarrhea 08/09/2016  . Fecal impaction (HCC) 08/09/2016  .  Psychosis (HCC) 11/23/2013  . Paranoia (HCC) 11/22/2013  . Fall from wheelchair 05/29/2013  . Anemia   . 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 Myoclonus 12/23/2012  . 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 Insomnia, unspecified 12/23/2012  . Menorrhagia with regular cycle 11/11/2012  . Mobility impaired 11/11/2012  . Wheelchair bound 11/11/2012    Helios Kohlmann, PT 07/02/2018, 2:05 PM  [facility] [address] [place], [place], [address] Phone: [phone]   Fax:  [phone]  Name: BURNETTE VALENTI MRN: [id number] Date of Birth: [birth year]

## 2018-07-02 NOTE — Telephone Encounter (Addendum)
Called [employer] to speak about repairs for power wheelchair.  Discussed priority to have power chair fixed per caregiver's report.  Also inquired about ability to get new manual w/c (this is not likely as insurance has now paid for power chair).  Patient's caregiver was setting up appointment with [employer] for home visit for repair.   Dulcey Riederer, PT

## 2018-07-02 NOTE — Patient Instructions (Signed)
CAREGIVER ASSISTED: Adductors    Caregiver moves leg out to side, keeping toes pointed up. Hold _30__ seconds. _3__ reps per set, _1-2_ sets per day.  Copyright  VHI. All rights reserved.   CAREGIVER ASSISTED: Hamstrings - Supine    Caregiver holds leg at ankle and over knee; raises leg straight. Hold _30__ seconds. _3__ reps per set, _1-2__ sets per day. Copyright  VHI. All rights reserved.   Flexion (Assistive)    Clasp hands together and raise arms above head, keeping elbows as straight as possible. Do lying down**. Repeat __5-10__ times. Do __2__ sessions per day.  Copyright  VHI. All rights reserved.

## 2018-07-03 ENCOUNTER — Other Ambulatory Visit: Payer: Self-pay | Admitting: Family Medicine

## 2018-07-03 DIAGNOSIS — R293 Abnormal posture: Secondary | ICD-10-CM

## 2018-07-03 DIAGNOSIS — M6281 Muscle weakness (generalized): Secondary | ICD-10-CM

## 2018-07-03 NOTE — Progress Notes (Unsigned)
Occupational referral was placed previously but was signed in the chart as a RX refill therefore the office we referred to could not access the referral. The referral is being placed again.

## 2018-07-04 ENCOUNTER — Encounter

## 2018-07-04 ENCOUNTER — Ambulatory Visit: Payer: Medicare Other | Admitting: Rehabilitative and Restorative Service Providers"

## 2018-07-04 DIAGNOSIS — M6249 Contracture of muscle, multiple sites: Secondary | ICD-10-CM

## 2018-07-04 DIAGNOSIS — R293 Abnormal posture: Secondary | ICD-10-CM

## 2018-07-04 DIAGNOSIS — G800 Spastic quadriplegic cerebral palsy: Secondary | ICD-10-CM | POA: Diagnosis not present

## 2018-07-04 DIAGNOSIS — M6281 Muscle weakness (generalized): Secondary | ICD-10-CM

## 2018-07-04 NOTE — Therapy (Signed)
[facility] [address] [place], [place], [address] Phone: [phone]   Fax:  [phone]  Physical Therapy Treatment  Patient Details  Name: Nancy [name] MRN: [id number] Date of Birth: 16-Dec-1990 Referring Provider (PT): Lynnea Ferrier, MD   Encounter Date: 07/04/2018  PT End of Session - 07/04/18 1349    Visit Number  3    Number of Visits  17    Date for PT Re-Evaluation  08/19/18    Authorization Type  medicare    PT Start Time  1346    PT Stop Time  1450    PT Time Calculation (min)  64 min    Activity Tolerance  Patient tolerated treatment well    Behavior During Therapy  [facility] for tasks assessed/performed       Past Medical History:  Diagnosis Date  . Anemia    h/o iron deficiency  . CP (cerebral palsy) (HCC)    with mild contracture  . IUD (intrauterine device) in place 10/2012  . Menorrhagia with regular cycle 11/11/2012  . Mobility impaired 11/11/2012  . Mood disorder (HCC)   . MR (mental retardation)   . Peripheral vascular disease (HCC) 10/02/2006   bilateral DVT lower legs  . Seizures (HCC)    childhood  . Wheelchair bound    r/t CP    Past Surgical History:  Procedure Laterality Date  . EYE SURGERY     as a child  . HIP SURGERY Right 2003  . INTRAUTERINE DEVICE (IUD) INSERTION N/A 11/12/2012   Procedure: INTRAUTERINE DEVICE (IUD) INSERTION;  Surgeon: Sherron Monday, MD;  Location: [facility];  Service: Gynecology;  Laterality: N/A;  . LEG SURGERY     as a child  . MULTIPLE TOOTH EXTRACTIONS  10/18/10    There were no vitals filed for this visit.  Subjective Assessment - 07/04/18 1553    Subjective  Patient arrives on her own from transportation and her caregiver, Raquel, joins [place] within 20 minutes of beginning session.  Her father comes in for approximately 20 minutes of the session.    Patient is accompained by:  Family member   father and Raquel, caregiver   Patient Stated Goals  Get  a stretching program/strengthening, help more with transfers, address equipment needs.     Currently in Pain?  No/denies                       [facility] Adult PT Treatment/Exercise - 07/04/18 1649      Wheelchair Mobility   Comments  PT saw patient for the first time in her power w/c-- plan to reach out to numotion for followup regarding upcoming appointment.       Neuro Re-ed    Neuro Re-ed Details   Seated edge of mat working on positioning for [name] to maintain upright.    PT worked on using L UE support with reaching laterally with R UE and switching UEs.  PT provided mod A to min A for support.       Exercises   Exercises  Other Exercises    Other Exercises    Supine hip adductor stretching, supine trunk rotation, supine bilateral knee to chest (hips flexed to 90) for low back mbility and stretching.       Therapeutic activities: The patient was transferred total assist w/c<>mat and then maintained standing edge of mat x 3 reps max assist for weight bearing.  Inquired about standing frame- father  has one, but thinks it is too small.  Status of [employer] Power Wheelchair:  W/c type:  Quantum Edge 2.0; [name] sits with posterior pelvic tilt slid forward in the power chair, her R arm is inside of the lateral support that is meant to be in contact with her right trunk, her Left side/trunk leans against the lateral support.  The center foot plate is positioned too long and this is contributing to the patient sliding forward in the wheelchair.  Broken:  Hip adductor pad, patient has feet velcro'd to foot plate, sits with knees windswept to the right,  Right calf pad is rusted.  Electronic buttons to switch controls are not working.   PT Short Term Goals - 06/20/18 1607      PT SHORT TERM GOAL #1   Title  The patient will have LMN with equipment needs addressed and sent to case manager    Time  4    Period  Weeks    Target Date  07/20/18      PT SHORT TERM GOAL #2    Title  The patient will maintain sitting edge of mat with UE support x 1 minute.    Time  4    Period  Weeks    Target Date  07/20/18      PT SHORT TERM GOAL #3   Title  The patient's caregiver will have initial home stretching program for LE tightness.    Time  4    Period  Weeks    Target Date  07/20/18      PT SHORT TERM GOAL #4   Title  The patient will have equipment needs further assessed.    Time  4    Period  Weeks    Target Date  07/20/18        PT Long Term Goals - 06/20/18 1608      PT LONG TERM GOAL #1   Title  The patient will perform home stretching with caregiver assistance.    Time  8    Period  Weeks    Target Date  08/19/18      PT LONG TERM GOAL #2   Title  The patient will maintain sitting edge of mat x 2 minutes wiht UE support.    Time  8    Period  Weeks    Target Date  08/19/18      PT LONG TERM GOAL #3   Title  Further LTGs to follow based on patient progress.              Patient will benefit from skilled therapeutic intervention in order to improve the following deficits and impairments:     Visit Diagnosis: Muscle weakness (generalized)  Abnormal posture  Contracture of muscle, multiple sites     Problem List Patient Active Problem List   Diagnosis Date Noted  . SIRS (systemic inflammatory response syndrome) (HCC) 08/09/2016  . Nausea vomiting and diarrhea 08/09/2016  . Fecal impaction (HCC) 08/09/2016  . Psychosis (HCC) 11/23/2013  . Paranoia (HCC) 11/22/2013  . Fall from wheelchair 05/29/2013  . Anemia   . 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 Myoclonus 12/23/2012  . 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 Insomnia, unspecified 12/23/2012  . Menorrhagia with regular cycle 11/11/2012  . Mobility impaired 11/11/2012  . Wheelchair bound 11/11/2012     Robby Bulkley, PT 07/04/2018,  5:03 PM  [facility] [address] [place], [place], [address] Phone: [phone]   Fax:  [phone]  Name: BONNI NEUSER MRN: [id number] Date of Birth: [birth year]

## 2018-07-09 ENCOUNTER — Other Ambulatory Visit: Payer: Self-pay | Admitting: Family Medicine

## 2018-07-09 ENCOUNTER — Ambulatory Visit: Payer: Medicare Other | Admitting: Rehabilitative and Restorative Service Providers"

## 2018-07-09 ENCOUNTER — Telehealth: Payer: Self-pay | Admitting: Rehabilitative and Restorative Service Providers"

## 2018-07-09 ENCOUNTER — Encounter: Payer: Self-pay | Admitting: Physical Medicine & Rehabilitation

## 2018-07-09 ENCOUNTER — Encounter: Payer: Self-pay | Admitting: Rehabilitative and Restorative Service Providers"

## 2018-07-09 DIAGNOSIS — M6281 Muscle weakness (generalized): Secondary | ICD-10-CM

## 2018-07-09 DIAGNOSIS — R293 Abnormal posture: Secondary | ICD-10-CM | POA: Diagnosis not present

## 2018-07-09 DIAGNOSIS — G800 Spastic quadriplegic cerebral palsy: Secondary | ICD-10-CM | POA: Diagnosis not present

## 2018-07-09 DIAGNOSIS — M6249 Contracture of muscle, multiple sites: Secondary | ICD-10-CM | POA: Diagnosis not present

## 2018-07-09 DIAGNOSIS — G8250 Quadriplegia, unspecified: Secondary | ICD-10-CM

## 2018-07-09 NOTE — Progress Notes (Signed)
Referral placed to Dr.Swartz

## 2018-07-09 NOTE — Therapy (Signed)
[place] [address] [place], [place], [address] Phone: [phone]   Fax:  [phone]  Physical Therapy Treatment  Patient Details  Name: Nancy [name] MRN: [id number] Date of Birth: January 26, 1991 Referring Provider (PT): Jenna Luo, MD   Encounter Date: 07/09/2018  PT End of Session - 07/09/18 1443    Visit Number  4    Number of Visits  17    Date for PT Re-Evaluation  08/19/18    Authorization Type  medicare    PT Start Time  1245    PT Stop Time  1325    PT Time Calculation (min)  40 min    Activity Tolerance  Patient tolerated treatment well    Behavior During Therapy  [facility] for tasks assessed/performed       Past Medical History:  Diagnosis Date  . Anemia    h/o iron deficiency  . CP (cerebral palsy) (HCC)    with mild contracture  . IUD (intrauterine device) in place 10/2012  . Menorrhagia with regular cycle 11/11/2012  . Mobility impaired 11/11/2012  . Mood disorder ([place])   . MR (mental retardation)   . Peripheral vascular disease ([place]) 10/02/2006   bilateral DVT lower legs  . Seizures ([place])    childhood  . Wheelchair bound    r/t CP    Past Surgical History:  Procedure Laterality Date  . EYE SURGERY     as a child  . HIP SURGERY Right 2003  . INTRAUTERINE DEVICE (IUD) INSERTION N/A 11/12/2012   Procedure: INTRAUTERINE DEVICE (IUD) INSERTION;  Surgeon: Thornell Sartorius, MD;  Location: [place] [place] [facility];  Service: Gynecology;  Laterality: N/A;  . LEG SURGERY     as a child  . MULTIPLE TOOTH EXTRACTIONS  10/18/10    There were no vitals filed for this visit.  Subjective Assessment - 07/09/18 1442    Subjective  The patient got her hospital bed for home.      Patient is accompained by:  Family member   Assisted living provider attends   Patient Stated Goals  Get a stretching program/strengthening, help more with transfers, address equipment needs.     Currently in Pain?  No/denies                        [facility] Adult PT Treatment/Exercise - 07/09/18 1444      Bed Mobility   Bed Mobility  Rolling Right;Rolling Left;Supine to Sit;Sit to Supine    Rolling Right  Moderate Assistance - Patient 50-74%    Rolling Left  Minimal Assistance - Patient > 75%    Supine to Sit  Maximal Assistance - Patient - Patient 25-49%    Sit to Supine  Maximal Assistance - Patient 25-49%      Transfers   Transfers  Squat Pivot Transfers    Squat Pivot Transfers  1: +1 Total assist    Squat Pivot Transfer Details (indicate cue type and reason)  w/c<>mat    Comments  Also worked on scooting in the w/c posteriorly by working on anteriorly leaning.        Self-Care   Self-Care  Other Self-Care Comments    Other Self-Care Comments   Discussed with caregiver [name] helping assist with w/c scooting by leaning forward in the chair.  Discussed home exercise with patient and encouraged more frequent participation with stretching.      Neuro Re-ed    Neuro Re-ed Details  Sitting edge of mat performing L UE support, R UE support, bilat UE support, reaching tasks R and L UEs.  PT worked to put weight through LEs and trunk position for more upright.  Worked on elbow prop to sitting and lateral weight shifting in seated psoition.  Also worked on flexing forward to reach for w/c leg rests, etc in the w/c.        Exercises   Exercises  Other Exercises    Other Exercises   Seated PROm UEs for stretching, posterior leaning against propped chair to upright for core activation x6 reps with min A.                 PT Short Term Goals - 07/09/18 1443      PT SHORT TERM GOAL #1   Title  The patient will have LMN with equipment needs addressed and sent to case manager    Time  4    Period  Weeks    Status  On-going      PT SHORT TERM GOAL #2   Title  The patient will maintain sitting edge of mat with UE support x 1 minute.    Baseline  Patient is able to do with superivsion holding  with L UE support.     Time  4    Period  Weeks    Status  Achieved      PT SHORT TERM GOAL #3   Title  The patient's caregiver will have initial home stretching program for LE tightness.    Time  4    Period  Weeks    Status  Achieved      PT SHORT TERM GOAL #4   Title  The patient will have equipment needs further assessed.    Time  4    Period  Weeks    Status  On-going        PT Long Term Goals - 06/20/18 1608      PT LONG TERM GOAL #1   Title  The patient will perform home stretching with caregiver assistance.    Time  8    Period  Weeks    Target Date  08/19/18      PT LONG TERM GOAL #2   Title  The patient will maintain sitting edge of mat x 2 minutes wiht UE support.    Time  8    Period  Weeks    Target Date  08/19/18      PT LONG TERM GOAL #3   Title  Further LTGs to follow based on patient progress.            Plan - 07/09/18 1447    Clinical Impression Statement  The patient met STG for sitting edge of mat and HEP has been established.  PT to continue addressing patient's w/c and equipment needs and progressing HEP.     PT Treatment/Interventions  ADLs/Self Care Home Management;Balance training;Neuromuscular re-education;Patient/family education;Functional mobility training;Therapeutic activities;Therapeutic exercise;DME Instruction;Manual techniques    PT Next Visit Plan  Check HEP, discuss LMN fo requipment; check seated edge of mat LTG and discuss carryover to home.    Consulted and Agree with Plan of Care  Patient;Family member/caregiver    Family Member Consulted  Raquel       Patient will benefit from skilled therapeutic intervention in order to improve the following deficits and impairments:  Impaired tone, Decreased activity tolerance, Decreased knowledge of use of DME, Decreased strength, Postural  dysfunction, Impaired flexibility, Decreased range of motion, Increased muscle spasms, Decreased mobility, Decreased balance  Visit  Diagnosis: Muscle weakness (generalized)  Abnormal posture  Contracture of muscle, multiple sites     Problem List Patient Active Problem List   Diagnosis Date Noted  . SIRS (systemic inflammatory response syndrome) ([place]) 08/09/2016  . Nausea vomiting and diarrhea 08/09/2016  . Fecal impaction ([place]) 08/09/2016  . Psychosis ([place]) 11/23/2013  . Paranoia ([place]) 11/22/2013  . Fall from wheelchair 05/29/2013  . Anemia   . Congenital quadriplegia ([place]) 12/23/2012  . 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 Myoclonus 12/23/2012  . 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 Insomnia, unspecified 12/23/2012  . Menorrhagia with regular cycle 11/11/2012  . Mobility impaired 11/11/2012  . Wheelchair bound 11/11/2012    [place], [place] 07/09/2018, 2:48 PM  [place] [address] [place], [place], [address] Phone: [phone]   Fax:  [phone]  Name: CHENAY NESMITH MRN: [id number] Date of Birth: 1991-01-15

## 2018-07-09 NOTE — Telephone Encounter (Signed)
Had a return phone call from case manager, Lenetta QuakerBenita Harris today.  PT attempted to return call, sent through to voicemail.  Have left 3+ messages over the past week; did not leave a message today.    Attempting to speak about eligibility for equipment.  Rion Catala, PT

## 2018-07-11 ENCOUNTER — Encounter: Payer: Self-pay | Admitting: Rehabilitative and Restorative Service Providers"

## 2018-07-11 ENCOUNTER — Ambulatory Visit: Payer: Medicare Other | Admitting: Rehabilitative and Restorative Service Providers"

## 2018-07-11 ENCOUNTER — Telehealth: Payer: Self-pay | Admitting: Rehabilitative and Restorative Service Providers"

## 2018-07-11 DIAGNOSIS — M6281 Muscle weakness (generalized): Secondary | ICD-10-CM | POA: Diagnosis not present

## 2018-07-11 DIAGNOSIS — R293 Abnormal posture: Secondary | ICD-10-CM | POA: Diagnosis not present

## 2018-07-11 DIAGNOSIS — M6249 Contracture of muscle, multiple sites: Secondary | ICD-10-CM

## 2018-07-11 DIAGNOSIS — G800 Spastic quadriplegic cerebral palsy: Secondary | ICD-10-CM | POA: Diagnosis not present

## 2018-07-11 NOTE — Therapy (Signed)
[facility] [address] [place], [place], [address] Phone: [phone]   Fax:  [phone]  Physical Therapy Treatment  Patient Details  Name: Nancy [name] MRN: [id number] Date of Birth: 1991-07-04 Referring Provider (PT): Lynnea Ferrier, MD   Encounter Date: 07/11/2018  PT End of Session - 07/11/18 1012    Visit Number  5    Number of Visits  17    Date for PT Re-Evaluation  08/19/18    Authorization Type  medicare    PT Start Time  [phone]    PT Stop Time  1017    PT Time Calculation (min)  39 min    Equipment Utilized During Treatment  Gait belt    Activity Tolerance  Patient tolerated treatment well    Behavior During Therapy  [facility] for tasks assessed/performed       Past Medical History:  Diagnosis Date  . Anemia    h/o iron deficiency  . CP (cerebral palsy) (HCC)    with mild contracture  . IUD (intrauterine device) in place 10/2012  . Menorrhagia with regular cycle 11/11/2012  . Mobility impaired 11/11/2012  . Mood disorder (HCC)   . MR (mental retardation)   . Peripheral vascular disease (HCC) 10/02/2006   bilateral DVT lower legs  . Seizures (HCC)    childhood  . Wheelchair bound    r/t CP    Past Surgical History:  Procedure Laterality Date  . EYE SURGERY     as a child  . HIP SURGERY Right 2003  . INTRAUTERINE DEVICE (IUD) INSERTION N/A 11/12/2012   Procedure: INTRAUTERINE DEVICE (IUD) INSERTION;  Surgeon: Sherron Monday, MD;  Location: [facility];  Service: Gynecology;  Laterality: N/A;  . LEG SURGERY     as a child  . MULTIPLE TOOTH EXTRACTIONS  10/18/10    There were no vitals filed for this visit.  Subjective Assessment - 07/11/18 0942    Subjective  The patient notes she was not sore after last visit.    Patient Stated Goals  Get a stretching program/strengthening, help more with transfers, address equipment needs.     Currently in Pain?  No/denies                        [facility] Adult PT Treatment/Exercise - 07/11/18 1343      Bed Mobility   Supine to Sit  Maximal Assistance - Patient - Patient 25-49%    Sit to Supine  Maximal Assistance - Patient 25-49%      Transfers   Transfers  Squat Pivot Transfers;Lateral/Scoot Scientist, research (life sciences) Transfers  1: +1 Total assist    Squat Pivot Transfer Details (indicate cue type and reason)  w/c<>mat    Lateral/Scoot Transfers  --   attempted, but unable   Comments  Attempted scooting once sliding board placed, however due to hip shortening/muscle shortening, patient is not able.  *PT to continue with squat pivot for safety.  Unless something changes with tone mgmt, sliding board transfers do not seem like a likely end goal.       Self-Care   Self-Care  Other Self-Care Comments    Other Self-Care Comments   Discussed home equipment- patient no longer allowed to ride SCAT in manual chair due to no wheel locks.        Neuro Re-ed    Neuro Re-ed Details   Sitting edge of mat x 1 minutes x  4 reps working on UE reaching, elbow propping to return to sitting, and sitting with UE support.        Exercises   Exercises  Other Exercises    Other Exercises   Supine PROM for hip IR/ER, hamstring stretch, knee to chest.                 PT Short Term Goals - 07/09/18 1443      PT SHORT TERM GOAL #1   Title  The patient will have LMN with equipment needs addressed and sent to case manager    Time  4    Period  Weeks    Status  On-going      PT SHORT TERM GOAL #2   Title  The patient will maintain sitting edge of mat with UE support x 1 minute.    Baseline  Patient is able to do with superivsion holding with L UE support.     Time  4    Period  Weeks    Status  Achieved      PT SHORT TERM GOAL #3   Title  The patient's caregiver will have initial home stretching program for LE tightness.    Time  4    Period  Weeks    Status  Achieved      PT SHORT TERM GOAL #4    Title  The patient will have equipment needs further assessed.    Time  4    Period  Weeks    Status  On-going        PT Long Term Goals - 06/20/18 1608      PT LONG TERM GOAL #1   Title  The patient will perform home stretching with caregiver assistance.    Time  8    Period  Weeks    Target Date  08/19/18      PT LONG TERM GOAL #2   Title  The patient will maintain sitting edge of mat x 2 minutes wiht UE support.    Time  8    Period  Weeks    Target Date  08/19/18      PT LONG TERM GOAL #3   Title  Further LTGs to follow based on patient progress.            Plan - 07/11/18 1014    Clinical Impression Statement  The patient's w/c locks do not work and this makes transfers significantly more challenging.  The patient's muscle tone + muscle shortening are limiting her assistance during transfers.  Do not think sliding board transfers are realistic due to extensor tone.  PT was able to make phone contact with case mgr today and will send LMN for hoyer lift and hand held shower attachment.      PT Treatment/Interventions  ADLs/Self Care Home Management;Balance training;Neuromuscular re-education;Patient/family education;Functional mobility training;Therapeutic activities;Therapeutic exercise;DME Instruction;Manual techniques    PT Next Visit Plan  possible hold until s/p tone mgmt visit with Dr. Riley KillSwartz, check HEP, LMN for eqwuipment, seated edge of mat.    Consulted and Agree with Plan of Care  Patient;Family member/caregiver    Family Member Consulted  Raquel       Patient will benefit from skilled therapeutic intervention in order to improve the following deficits and impairments:  Impaired tone, Decreased activity tolerance, Decreased knowledge of use of DME, Decreased strength, Postural dysfunction, Impaired flexibility, Decreased range of motion, Increased muscle spasms, Decreased mobility, Decreased balance  Visit  Diagnosis: Muscle weakness (generalized)  Abnormal  posture  Contracture of muscle, multiple sites     Problem List Patient Active Problem List   Diagnosis Date Noted  . SIRS (systemic inflammatory response syndrome) (HCC) 08/09/2016  . Nausea vomiting and diarrhea 08/09/2016  . Fecal impaction (HCC) 08/09/2016  . Psychosis (HCC) 11/23/2013  . Paranoia (HCC) 11/22/2013  . Fall from wheelchair 05/29/2013  . Anemia   . 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 Myoclonus 12/23/2012  . 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 Insomnia, unspecified 12/23/2012  . Menorrhagia with regular cycle 11/11/2012  . Mobility impaired 11/11/2012  . Wheelchair bound 11/11/2012    Aina Rossbach, PT 07/11/2018, 3:46 PM  [facility] [address] [place], [place], [address] Phone: [phone]   Fax:  [phone]  Name: ADELYNA BROCKMAN MRN: [id number] Date of Birth: [birth year]

## 2018-07-11 NOTE — Telephone Encounter (Signed)
Spoke with Lenetta QuakerBenita Harris re: Min's equipment.  PT to submit a LMN for hoyer lift and handheld shower head.    She notes that power w/c and request for a transport chair would go through insurance.  PT to f/u with [employer] re: w/c.  Tynika Luddy, PT

## 2018-07-14 ENCOUNTER — Ambulatory Visit: Payer: Medicare Other | Admitting: Rehabilitative and Restorative Service Providers"

## 2018-07-14 ENCOUNTER — Encounter: Payer: Self-pay | Admitting: Rehabilitative and Restorative Service Providers"

## 2018-07-14 ENCOUNTER — Ambulatory Visit: Payer: Medicare Other | Admitting: Occupational Therapy

## 2018-07-14 DIAGNOSIS — M6249 Contracture of muscle, multiple sites: Secondary | ICD-10-CM | POA: Diagnosis not present

## 2018-07-14 DIAGNOSIS — M6281 Muscle weakness (generalized): Secondary | ICD-10-CM

## 2018-07-14 DIAGNOSIS — R293 Abnormal posture: Secondary | ICD-10-CM

## 2018-07-14 DIAGNOSIS — G800 Spastic quadriplegic cerebral palsy: Secondary | ICD-10-CM | POA: Diagnosis not present

## 2018-07-14 NOTE — Therapy (Signed)
[place] [address] [place], [place], [address] Phone: [phone]   Fax:  [phone]  Physical Therapy Treatment  Patient Details  Name: Nancy [name] MRN: [id number] Date of Birth: 07-01-1991 Referring Provider (PT): Jenna Luo, MD   Encounter Date: 07/14/2018  PT End of Session - 07/14/18 1224    Visit Number  6    Number of Visits  17    Date for PT Re-Evaluation  08/19/18    Authorization Type  medicare    PT Start Time  0945    PT Stop Time  1016    PT Time Calculation (min)  31 min    Equipment Utilized During Treatment  --    Activity Tolerance  Patient tolerated treatment well    Behavior During Therapy  [facility] for tasks assessed/performed       Past Medical History:  Diagnosis Date  . Anemia    h/o iron deficiency  . CP (cerebral palsy) (HCC)    with mild contracture  . IUD (intrauterine device) in place 10/2012  . Menorrhagia with regular cycle 11/11/2012  . Mobility impaired 11/11/2012  . Mood disorder ([place])   . MR (mental retardation)   . Peripheral vascular disease ([place]) 10/02/2006   bilateral DVT lower legs  . Seizures ([place])    childhood  . Wheelchair bound    r/t CP    Past Surgical History:  Procedure Laterality Date  . EYE SURGERY     as a child  . HIP SURGERY Right 2003  . INTRAUTERINE DEVICE (IUD) INSERTION N/A 11/12/2012   Procedure: INTRAUTERINE DEVICE (IUD) INSERTION;  Surgeon: Thornell Sartorius, MD;  Location: [facility];  Service: Gynecology;  Laterality: N/A;  . LEG SURGERY     as a child  . MULTIPLE TOOTH EXTRACTIONS  10/18/10    There were no vitals filed for this visit.  Subjective Assessment - 07/14/18 1222    Subjective  The patient's caregiver notes they missed OT today due to too hectic for an 8am appt.  They arrived 15 minutes late to our PT visit today.  She notes she was also unsure if we should wait until after [name] sees Dr. Naaman Plummer.  Discussed that OT can  look at modifications to hold drink and give HEP for maintaining ROM.    Patient is accompained by:  --   Raquel   Patient Stated Goals  Get a stretching program/strengthening, help more with transfers, address equipment needs.     Currently in Pain?  No/denies                       [facility] Adult PT Treatment/Exercise - 07/14/18 1225      Self-Care   Self-Care  Other Self-Care Comments    Other Self-Care Comments   Discussed holding PT until early Dec when we can schedule a w/c consult with [employer] to determine if current w/c is in a state that would qualify for early replacement.  Discussed daily stretching routine.      Exercises   Exercises  Other Exercises    Other Exercises   Reviewed prior HEP discussing with caregiver as able.  Review included hip adductor stretch, hamstring stretch, and AAROM overhead shoulder flexion.  Added knee to chest with discussion of only going where L hip will allow from a mobility standpoint (stop when end block is felt).  Also performed supine gentle lumbar stretching bringing both hips into 90 degrees  flexion supported by PT with gentle rocking.               PT Short Term Goals - 07/14/18 1227      PT SHORT TERM GOAL #1   Title  The patient will have LMN with equipment needs addressed and sent to case manager    Time  4    Period  Weeks    Status  On-going    Target Date  07/20/18      PT SHORT TERM GOAL #2   Title  The patient will maintain sitting edge of mat with UE support x 1 minute.    Baseline  Patient is able to do with superivsion holding with L UE support.     Time  4    Period  Weeks    Status  Achieved      PT SHORT TERM GOAL #3   Title  The patient's caregiver will have initial home stretching program for LE tightness.    Time  4    Period  Weeks    Status  Achieved      PT SHORT TERM GOAL #4   Title  The patient will have equipment needs further assessed.    Time  4    Period  Weeks    Status   Achieved        PT Long Term Goals - 06/20/18 1608      PT LONG TERM GOAL #1   Title  The patient will perform home stretching with caregiver assistance.    Time  8    Period  Weeks    Target Date  08/19/18      PT LONG TERM GOAL #2   Title  The patient will maintain sitting edge of mat x 2 minutes wiht UE support.    Time  8    Period  Weeks    Target Date  08/19/18      PT LONG TERM GOAL #3   Title  Further LTGs to follow based on patient progress.            Plan - 07/14/18 1227    Clinical Impression Statement  The patient has met 3 STGs.  PT working with case mgr and [employer] to continue to determine home equipment needs.  The caregiver has HEP to work on for ongoing maintenance routine.  Plan to hold and f/u for 1 visit for w/c consult and then a visit after seeing Dr. Naaman Plummer to potentially add further stretching once patient has tone mgmt assessment.     PT Treatment/Interventions  ADLs/Self Care Home Management;Balance training;Neuromuscular re-education;Patient/family education;Functional mobility training;Therapeutic activities;Therapeutic exercise;DME Instruction;Manual techniques    PT Next Visit Plan  do one visit with [employer] early dec for w/c consult; f/u after tone assessment and consult with dr. Naaman Plummer.    Consulted and Agree with Plan of Care  Patient;Family member/caregiver    Family Member Consulted  Raquel       Patient will benefit from skilled therapeutic intervention in order to improve the following deficits and impairments:  Impaired tone, Decreased activity tolerance, Decreased knowledge of use of DME, Decreased strength, Postural dysfunction, Impaired flexibility, Decreased range of motion, Increased muscle spasms, Decreased mobility, Decreased balance  Visit Diagnosis: Muscle weakness (generalized)  Abnormal posture  Contracture of muscle, multiple sites     Problem List Patient Active Problem List   Diagnosis Date Noted  . SIRS  (systemic inflammatory response syndrome) ([place]) 08/09/2016  .  Nausea vomiting and diarrhea 08/09/2016  . Fecal impaction ([place]) 08/09/2016  . Psychosis ([place]) 11/23/2013  . Paranoia ([place]) 11/22/2013  . Fall from wheelchair 05/29/2013  . Anemia   . 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 Myoclonus 12/23/2012  . 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 Insomnia, unspecified 12/23/2012  . Menorrhagia with regular cycle 11/11/2012  . Mobility impaired 11/11/2012  . Wheelchair bound 11/11/2012    [place], PT 07/14/2018, 12:29 PM  [place] [address] [place], [place], [address] Phone: [phone]   Fax:  [phone]  Name: Nancy [name] MRN: [id number] Date of Birth: 1991/06/11

## 2018-07-14 NOTE — Patient Instructions (Signed)
CAREGIVER ASSISTED: Adductors    Caregiver moves leg out to side, keeping toes pointed up. Hold _30__ seconds. _3__ reps per set, _1-2_ sets per day.  Copyright  VHI. All rights reserved.   CAREGIVER ASSISTED: Hamstrings - Supine    Caregiver holds leg at ankle and over knee; raises leg straight. Hold _30__ seconds. _3__ reps per set, _1-2__ sets per day. Copyright  VHI. All rights reserved.   Flexion (Assistive)    Clasp hands together and raise arms above head, keeping elbows as straight as possible. Do lying down**. Repeat __5-10__ times. Do __2__ sessions per day.  Copyright  VHI. All rights reserved.          Electronically signed by Berneice HeinrichWeaver, Treana Lacour M, PT at 07/02/2018 11:46 AM    CAREGIVER ASSISTED: Hip / Knee Flexion    Caregiver holds leg at knee and heel; raises knee to chest. Hold __30_ seconds. _3__ reps per set, _1__ sets per day.  Copyright  VHI. All rights reserved.

## 2018-07-16 ENCOUNTER — Ambulatory Visit: Payer: Self-pay | Admitting: Rehabilitative and Restorative Service Providers"

## 2018-07-16 ENCOUNTER — Encounter: Payer: Self-pay | Admitting: Occupational Therapy

## 2018-07-17 ENCOUNTER — Ambulatory Visit: Payer: Medicare Other | Admitting: Occupational Therapy

## 2018-07-17 DIAGNOSIS — G800 Spastic quadriplegic cerebral palsy: Secondary | ICD-10-CM

## 2018-07-17 DIAGNOSIS — R293 Abnormal posture: Secondary | ICD-10-CM | POA: Diagnosis not present

## 2018-07-17 DIAGNOSIS — M6281 Muscle weakness (generalized): Secondary | ICD-10-CM | POA: Diagnosis not present

## 2018-07-17 DIAGNOSIS — M6249 Contracture of muscle, multiple sites: Secondary | ICD-10-CM

## 2018-07-17 NOTE — Therapy (Signed)
[facility] [address] [place], [place], [address] Phone: [phone]   Fax:  [phone]  Occupational Therapy Evaluation  Patient Details  Name: Nancy [name] MRN: [id number] Date of Birth: 1990-09-05 No data recorded  Encounter Date: 07/17/2018  OT End of Session - 07/17/18 1702    Visit Number  1    Number of Visits  8    Date for OT Re-Evaluation  09/15/17    Authorization Type  MCR/MCD    Authorization - Visit Number  1    Authorization - Number of Visits  10    OT Start Time  1325    OT Stop Time  1405    OT Time Calculation (min)  40 min    Activity Tolerance  Patient tolerated treatment well    Behavior During Therapy  [facility] for tasks assessed/performed       Past Medical History:  Diagnosis Date  . Anemia    h/o iron deficiency  . CP (cerebral palsy) (HCC)    with mild contracture  . IUD (intrauterine device) in place 10/2012  . Menorrhagia with regular cycle 11/11/2012  . Mobility impaired 11/11/2012  . Mood disorder (HCC)   . MR (mental retardation)   . Peripheral vascular disease (HCC) 10/02/2006   bilateral DVT lower legs  . Seizures (HCC)    childhood  . Wheelchair bound    r/t CP    Past Surgical History:  Procedure Laterality Date  . EYE SURGERY     as a child  . HIP SURGERY Right 2003  . INTRAUTERINE DEVICE (IUD) INSERTION N/A 11/12/2012   Procedure: INTRAUTERINE DEVICE (IUD) INSERTION;  Surgeon: Sherron Monday, MD;  Location: [facility];  Service: Gynecology;  Laterality: N/A;  . LEG SURGERY     as a child  . MULTIPLE TOOTH EXTRACTIONS  10/18/10    There were no vitals filed for this visit.  Subjective Assessment - 07/17/18 1329    Patient is accompained by:  Family member   AFL (Racquel)   Pertinent History  Spastic quadraplegia d/t CP    Patient Stated Goals  Increase independence with feeding and assess for splinting needs    Currently in Pain?  No/denies        [facility] OT  Assessment - 07/17/18 0001      Assessment   Medical Diagnosis  Spastic Quadriplegia   (cerebral palsy)    Onset Date/Surgical Date  --   congenital - referral date 06/24/18   Hand Dominance  Left    Prior Therapy  none at this clinic- unable to answer about previous therapy      Precautions   Precautions  Fall    Precaution Comments  Patient has high tone and leans posteriorly making transfers challenging      Home  Environment   Additional Comments  Pt lives with assisted family living provider (AFL) "Raquel". DME: tub bench, manual w/c, powered w/c. Pt's father legal guardian      Prior Function   Level of Independence  Needs assistance with ADLs;Needs assistance with transfers    Vocation  Part time employment    Vocation Requirements  Patient works Tues/Thurs 9-3 and goes to day program M,W,F ([employer])       ADL   Eating/Feeding  --   multiple spills if pt attempts, therefore caregiver feeds pt   ADL comments  Pt is dependent for all other BADLS  Mobility   Mobility Status  Needs assist    Mobility Status Comments  Pt dependent for transfers. Pt goes into extensor pattern (d/t tone/spasticity) when attempting transfers making it unsafe w/o proper DME      Written Expression   Dominant Hand  Left      Cognition   Cognition Comments  Pt verbal and able to answer simple questions and follows 1-2 step commands. Pt with cognitive delays      Observation/Other Assessments   Observations  Pt overall uses RUE only as stabalizer to gross assist, LUE more functional and has movement for feeding self and grooming but decreased control       Posture/Postural Control   Posture/Postural Control  Postural limitations    Postural Limitations  Posterior pelvic tilt;Rounded Shoulders;Weight shift left      AROM   Overall AROM Comments  RUE: Pt has approx 120* shoulder flexion, full elbow flexion, but difficulty going into elbow extension d/t spasticity. Pt  passively to -45* elbow extension. Pt has fairly good distal movement, however limited functional use d/t dystonia. Pt can also actively oppose thumb to 4th digit and perform active palmer abduction, but holds thumb adducted to flexion position. RUE can act as stabalizer to gross assist. LUE: shoulder and elbow movement WFL's however difficult to control both simultaneously d/t dystonia. Pt with slightly more tone distally at Lt forearm/hand and decr ability to fully open hand but still uses LUE more functionally      RUE Tone   RUE Tone  Modified Ashworth      RUE Tone   Modified Ashworth Scale for Grading Hypertonia RUE  Considerable increase in muschle tone, passive movement difficult   for elbow extension     LUE Tone   LUE Tone  Modified Ashworth      LUE Tone   Modified Ashworth Scale for Grading Hypertonia LUE  Slight increase in muscle tone, manifested by a catch, followed by minimal resistance throughout the remainder (less than half) of the ROM                      OT Education - 07/17/18 1704    Education Details  Issued and reviewed wear and care of bean bag splint for Rt elbow, neoprene CMC splints for bilateral thumbs    Person(s) Educated  Patient;Caregiver(s)    Methods  Explanation;Demonstration    Comprehension  Verbalized understanding          OT Long Term Goals - 07/17/18 1711      OT LONG TERM GOAL #1   Title  Pt/caregiver independent with splint wear and care BUE's     Time  8    Period  Weeks    Status  On-going      OT LONG TERM GOAL #2   Title  Pt/caregiver will verbalize understanding with recommended A/E needs to increase independence with feeding self LUE    Time  8    Period  Weeks    Status  New      OT LONG TERM GOAL #3   Title  Pt will feed self 50% of the time w/ minimal spills LUE using A/E prn    Time  8    Period  Weeks    Status  New            Plan - 07/17/18 1705    Clinical Impression Statement  Pt is a 27  y.o. female who  presents to outpatient O.T. for splinting needs to help maximize function and decrease tightness and contractures of joints BUE's, and A/E recommendations to increase independence with ADLS. Pt has CP with spastic quadraplegia and related deficits in trunk and all extremities (RUE more spasticity than LUE).     Occupational Profile and client history currently impacting functional performance  Pt dependent for ADLS, lives with assisted family living provider, attends day program 3 days/week, adapted work program 2 days/week. Pt's congenital deficits limiting ADLS    Occupational performance deficits (Please refer to evaluation for details):  ADL's;IADL's;Leisure    Rehab Potential  Good   for goals   Current Impairments/barriers affecting progress:  severity of deficits    OT Frequency  1x / week    OT Duration  8 weeks    OT Treatment/Interventions  Self-care/ADL training;DME and/or AE instruction;Splinting;Coping strategies training;Patient/family education;Building services engineer;Therapeutic activities    Plan  assess current splints issued, take measurements for Bamboo brace for LUE, begin A/E exploration for feeding self (? angled spoon vs. swivel spoon, scooped dishes, adaptive cups)    Clinical Decision Making  Limited treatment options, no task modification necessary    Consulted and Agree with Plan of Care  Patient;Other (Comment)    Family Member Consulted  Raquel       Patient will benefit from skilled therapeutic intervention in order to improve the following deficits and impairments:  Decreased coordination, Decreased range of motion, Impaired flexibility, Improper body mechanics, Improper spinal/pelvic alignment, Impaired sensation, Decreased activity tolerance, Impaired tone, Impaired UE functional use, Decreased knowledge of use of DME, Decreased mobility, Decreased strength  Visit Diagnosis: Spastic quadriplegic cerebral palsy (HCC) - Plan: Ot plan of care  cert/re-cert  Abnormal posture - Plan: Ot plan of care cert/re-cert  Contracture of muscle, multiple sites - Plan: Ot plan of care cert/re-cert    Problem List Patient Active Problem List   Diagnosis Date Noted  . SIRS (systemic inflammatory response syndrome) (HCC) 08/09/2016  . Nausea vomiting and diarrhea 08/09/2016  . Fecal impaction (HCC) 08/09/2016  . Psychosis (HCC) 11/23/2013  . Paranoia (HCC) 11/22/2013  . Fall from wheelchair 05/29/2013  . Anemia   . 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 Myoclonus 12/23/2012  . 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 Insomnia, unspecified 12/23/2012  . Menorrhagia with regular cycle 11/11/2012  . Mobility impaired 11/11/2012  . Wheelchair bound 11/11/2012    Kelli Churn, OTR/L 07/17/2018, 5:22 PM  [facility] [address] [place], [place], [address] Phone: [phone]   Fax:  [phone]  Name: LATRENDA IRANI MRN: [id number] Date of Birth: Apr 13, 1991

## 2018-07-21 ENCOUNTER — Encounter: Payer: Self-pay | Admitting: Occupational Therapy

## 2018-07-21 ENCOUNTER — Ambulatory Visit: Payer: Self-pay | Admitting: Rehabilitative and Restorative Service Providers"

## 2018-07-21 ENCOUNTER — Ambulatory Visit: Payer: Self-pay | Admitting: Physical Therapy

## 2018-07-28 ENCOUNTER — Ambulatory Visit: Payer: Self-pay | Admitting: Rehabilitative and Restorative Service Providers"

## 2018-07-28 ENCOUNTER — Encounter: Payer: Self-pay | Admitting: Occupational Therapy

## 2018-07-30 ENCOUNTER — Encounter: Payer: Self-pay | Admitting: Rehabilitative and Restorative Service Providers"

## 2018-07-30 ENCOUNTER — Ambulatory Visit: Payer: Medicare Other | Admitting: Occupational Therapy

## 2018-07-30 ENCOUNTER — Ambulatory Visit: Payer: Medicare Other | Attending: Family Medicine | Admitting: Rehabilitative and Restorative Service Providers"

## 2018-07-30 DIAGNOSIS — M6249 Contracture of muscle, multiple sites: Secondary | ICD-10-CM

## 2018-07-30 DIAGNOSIS — G800 Spastic quadriplegic cerebral palsy: Secondary | ICD-10-CM

## 2018-07-30 DIAGNOSIS — R293 Abnormal posture: Secondary | ICD-10-CM | POA: Diagnosis not present

## 2018-07-30 DIAGNOSIS — M6281 Muscle weakness (generalized): Secondary | ICD-10-CM | POA: Diagnosis not present

## 2018-07-30 NOTE — Patient Instructions (Signed)
CAREGIVER ASSISTED: Adductors    Caregiver moves leg out to side, keeping toes pointed up. Hold _30__ seconds. _3__ reps per set, _1-2_ sets per day.  Copyright  VHI. All rights reserved.  CAREGIVER ASSISTED: Hamstrings - Supine    Caregiver holds leg at ankle and over knee; raises leg straight. Hold _30__ seconds. _3__ reps per set, _1-2__ sets per day. Copyright  VHI. All rights reserved.  Flexion (Assistive)    Clasp hands together and raise arms above head, keeping elbows as straight as possible.Do lying down**. Repeat __5-10__ times. Do __2__ sessions per day.  Copyright  VHI. All rights reserved.         Electronically signed by Berneice HeinrichWeaver, Nancy [name], PT at 07/02/2018 11:46 AM    CAREGIVER ASSISTED: Hip / Knee Flexion    Caregiver holds leg at knee and heel; raises knee to chest. Hold __30_ seconds. _3__ reps per set, _1__ sets per day.  Copyright  VHI. All rights reserved.   STANDING NEAR HOSPITAL BED OR COUNTERTOP x 2 minutes x 3 times working on standing up tall.  PROVIDE assist at knees to ensure Nancy [name] remains upright.

## 2018-07-30 NOTE — Therapy (Signed)
[facility] [address] [place], [place], [address] Phone: [phone]   Fax:  [phone]  Physical Therapy Treatment  Patient Details  Name: Nancy [name] MRN: [id number] Date of Birth: 05-05-1991 Referring Provider (PT): Lynnea Ferrier, MD   Encounter Date: 07/30/2018  PT End of Session - 07/30/18 1308    Visit Number  7    Number of Visits  17    Date for PT Re-Evaluation  08/19/18    Authorization Type  medicare    PT Start Time  0940    PT Stop Time  1020    PT Time Calculation (min)  40 min    Activity Tolerance  Patient tolerated treatment well    Behavior During Therapy  [facility] for tasks assessed/performed       Past Medical History:  Diagnosis Date  . Anemia    h/o iron deficiency  . CP (cerebral palsy) (HCC)    with mild contracture  . IUD (intrauterine device) in place 10/2012  . Menorrhagia with regular cycle 11/11/2012  . Mobility impaired 11/11/2012  . Mood disorder (HCC)   . MR (mental retardation)   . Peripheral vascular disease (HCC) 10/02/2006   bilateral DVT lower legs  . Seizures (HCC)    childhood  . Wheelchair bound    r/t CP    Past Surgical History:  Procedure Laterality Date  . EYE SURGERY     as a child  . HIP SURGERY Right 2003  . INTRAUTERINE DEVICE (IUD) INSERTION N/A 11/12/2012   Procedure: INTRAUTERINE DEVICE (IUD) INSERTION;  Surgeon: Sherron Monday, MD;  Location: [facility];  Service: Gynecology;  Laterality: N/A;  . LEG SURGERY     as a child  . MULTIPLE TOOTH EXTRACTIONS  10/18/10    There were no vitals filed for this visit.  Subjective Assessment - 07/30/18 0954    Subjective  The patient reports that she is stretching intermittently.  Her caregiver notes it has been a busy time.    Patient Stated Goals  Get a stretching program/strengthening, help more with transfers, address equipment needs.     Currently in Pain?  No/denies                        [facility] Adult PT Treatment/Exercise - 07/30/18 1309      Transfers   Transfers  Sit to Stand;Stand to Sit    Sit to Stand  3: Mod assist    Sit to Stand Details (indicate cue type and reason)  Patient stood at sink with UE support and PT guarding knees, Patient stood x 90+ seconds x 3 reps.    Stand to Sit  3: Mod assist    Stand to Sit Details  PT blocks patient's knees to ensure she does not slide forward.  She is positioned at edge of w/c seat (due to height of power chair) and then has to be dependently scooted back in chair.  She is unable to flex at the trunk to allow for assist with transfers.      Self-Care   Self-Care  Other Self-Care Comments    Other Self-Care Comments   Patient's caregiver had a phone call from [employer] and tech was not able to fix w/c today due to illness.  She called back and got scheduled for ATC to come to next session to fix chair and deremine medical necessity for new w/c.   Discussed adductor pad and  positionin in chair for improved posture.      Exercises   Exercises  Other Exercises    Other Exercises   Seated trunk stretching reaching L arm across midline, bilateal shoulder flexion reaching overhead. Trunk flexion working on anterior leaning.              PT Education - 07/30/18 1305    Education Details  Patient's caregiver is standing intermittently at home with [name] (with assist).  PT performed this and discussed guarding knee and holding at hospital bed or kitchen sink.  Purpose is to help stretch the legs and owrk on trunk upright.    Person(s) Educated  Secretary/administrator)    Methods  Demonstration;Explanation;Handout    Comprehension  Verbalized understanding;Returned demonstration       PT Short Term Goals - 07/14/18 1227      PT SHORT TERM GOAL #1   Title  The patient will have LMN with equipment needs addressed and sent to case manager    Time  4    Period  Weeks    Status  On-going     Target Date  07/20/18      PT SHORT TERM GOAL #2   Title  The patient will maintain sitting edge of mat with UE support x 1 minute.    Baseline  Patient is able to do with superivsion holding with L UE support.     Time  4    Period  Weeks    Status  Achieved      PT SHORT TERM GOAL #3   Title  The patient's caregiver will have initial home stretching program for LE tightness.    Time  4    Period  Weeks    Status  Achieved      PT SHORT TERM GOAL #4   Title  The patient will have equipment needs further assessed.    Time  4    Period  Weeks    Status  Achieved        PT Long Term Goals - 06/20/18 1608      PT LONG TERM GOAL #1   Title  The patient will perform home stretching with caregiver assistance.    Time  8    Period  Weeks    Target Date  08/19/18      PT LONG TERM GOAL #2   Title  The patient will maintain sitting edge of mat x 2 minutes wiht UE support.    Time  8    Period  Weeks    Target Date  08/19/18      PT LONG TERM GOAL #3   Title  Further LTGs to follow based on patient progress.            Plan - 07/30/18 1309    Clinical Impression Statement  The patient's HEP was further updated to encourage weight bearing to patient tolerance during ADLs with caregiver.  Also discussed some positioning changes needed in current w/c.  PT to see one more visit to meet with [employer] ATC and will then schedule for post MD visit for tone mgmt.    PT Treatment/Interventions  ADLs/Self Care Home Management;Balance training;Neuromuscular re-education;Patient/family education;Functional mobility training;Therapeutic activities;Therapeutic exercise;DME Instruction;Manual techniques    PT Next Visit Plan  do one visit with [employer] early dec for w/c consult; f/u after tone assessment and consult with dr. Riley Kill.    Consulted and Agree with Plan of Care  Patient;Family member/caregiver  Family Member Consulted  Raquel       Patient will benefit from skilled  therapeutic intervention in order to improve the following deficits and impairments:  Impaired tone, Decreased activity tolerance, Decreased knowledge of use of DME, Decreased strength, Postural dysfunction, Impaired flexibility, Decreased range of motion, Increased muscle spasms, Decreased mobility, Decreased balance  Visit Diagnosis: Abnormal posture  Contracture of muscle, multiple sites  Muscle weakness (generalized)     Problem List Patient Active Problem List   Diagnosis Date Noted  . SIRS (systemic inflammatory response syndrome) (HCC) 08/09/2016  . Nausea vomiting and diarrhea 08/09/2016  . Fecal impaction (HCC) 08/09/2016  . Psychosis (HCC) 11/23/2013  . Paranoia (HCC) 11/22/2013  . Fall from wheelchair 05/29/2013  . Anemia   . 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 Myoclonus 12/23/2012  . 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 Insomnia, unspecified 12/23/2012  . Menorrhagia with regular cycle 11/11/2012  . Mobility impaired 11/11/2012  . Wheelchair bound 11/11/2012    Kourtnee Lahey, PT 07/30/2018, 1:17 PM  [facility] [address] [place], [place], [address] Phone: [phone]   Fax:  [phone]  Name: Nancy [name] MRN: [id number] Date of Birth: Dec 03, 1990

## 2018-07-30 NOTE — Therapy (Signed)
[facility] [address] [place], [place], [address] Phone: [phone]   Fax:  [phone]  Occupational Therapy Treatment  Patient Details  Name: Nancy [name] MRN: [id number] Date of Birth: [birth year] No data recorded  Encounter Date: 07/30/2018  OT End of Session - 07/30/18 1408    Visit Number  2    Number of Visits  8    Date for OT Re-Evaluation  09/15/17    Authorization Type  MCR/MCD    Authorization - Visit Number  2    Authorization - Number of Visits  10    OT Start Time  1105    OT Stop Time  1150    OT Time Calculation (min)  45 min    Activity Tolerance  Patient tolerated treatment well    Behavior During Therapy  [facility] for tasks assessed/performed       Past Medical History:  Diagnosis Date  . Anemia    h/o iron deficiency  . CP (cerebral palsy) (HCC)    with mild contracture  . IUD (intrauterine device) in place 10/2012  . Menorrhagia with regular cycle 11/11/2012  . Mobility impaired 11/11/2012  . Mood disorder (HCC)   . MR (mental retardation)   . Peripheral vascular disease (HCC) 10/02/2006   bilateral DVT lower legs  . Seizures (HCC)    childhood  . Wheelchair bound    r/t CP    Past Surgical History:  Procedure Laterality Date  . EYE SURGERY     as a child  . HIP SURGERY Right 2003  . INTRAUTERINE DEVICE (IUD) INSERTION N/A 11/12/2012   Procedure: INTRAUTERINE DEVICE (IUD) INSERTION;  Surgeon: Sherron Monday, MD;  Location: [facility];  Service: Gynecology;  Laterality: N/A;  . LEG SURGERY     as a child  . MULTIPLE TOOTH EXTRACTIONS  10/18/10    There were no vitals filed for this visit.  Subjective Assessment - 07/30/18 1112    Subjective   I think this will help (re: A/E). Caregiver also reports previously issued pre-fab splints working fine    Patient is accompained by:  Family member   AFL - Racquel   Pertinent History  Spastic quadraplegia d/t CP    Patient Stated Goals   Increase independence with feeding and assess for splinting needs    Currently in Pain?  No/denies                   OT Treatments/Exercises (OP) - 07/30/18 1404      ADLs   Eating  Practiced eating w/ various adapted spoons - pt did best with swivel spoon. Pt and caregiver also shown recommendations for scoop dish or plate guard. ALso made recommendations for best style drinking cup (large open handles w/ lid and straw)     ADL Comments  Measurements taken for Bamboo Brace and explained purpose of Bamboo brace to help manage spasticity, prevent contractures, and help prevent pt from sticking her fingers in mouth LUE             OT Education - 07/30/18 1407    Education Details  A/E recommendations, LUE brace recommendations    Person(s) Educated  Patient;Spouse    Methods  Explanation;Handout   handout provided for bamboo brace   Comprehension  Verbalized understanding;Returned demonstration   Pt return demo of eating         OT Long Term Goals - 07/30/18 1409  OT LONG TERM GOAL #1   Title  Pt/caregiver independent with splint wear and care BUE's     Time  8    Period  Weeks    Status  On-going      OT LONG TERM GOAL #2   Title  Pt/caregiver will verbalize understanding with recommended A/E needs to increase independence with feeding self LUE    Time  8    Period  Weeks    Status  On-going      OT LONG TERM GOAL #3   Title  Pt will feed self 50% of the time w/ minimal spills LUE using A/E prn    Time  8    Period  Weeks    Status  On-going            Plan - 07/30/18 1409    Clinical Impression Statement  Pt returns today reporting pre-fab [facility] neoprene splints were working fine and Rt bean bag splint was working fine, although pt annoyed w/ bean bag splint. Pt approximating all goals    Occupational Profile and client history currently impacting functional performance  Pt dependent for ADLS, lives with assisted family living provider,  attends day program 3 days/week, adapted work program 2 days/week. Pt's congenital deficits limiting ADLS    Occupational performance deficits (Please refer to evaluation for details):  ADL's;IADL's;Leisure    Rehab Potential  Good    Current Impairments/barriers affecting progress:  severity of deficits    OT Frequency  1x / week    OT Duration  8 weeks    OT Treatment/Interventions  Self-care/ADL training;DME and/or AE instruction;Splinting;Coping strategies training;Patient/family education;Building services engineerunctional Mobility Training;Therapeutic activities    Plan  Therapist to write LMN for LUE brace and A/E recommendations, will place pt on hold until we hear back if approved.     Consulted and Agree with Plan of Care  Patient;Other (Comment)    Family Member Consulted  Raquel - AFL       Patient will benefit from skilled therapeutic intervention in order to improve the following deficits and impairments:  Decreased coordination, Decreased range of motion, Impaired flexibility, Improper body mechanics, Improper spinal/pelvic alignment, Impaired sensation, Decreased activity tolerance, Impaired tone, Impaired UE functional use, Decreased knowledge of use of DME, Decreased mobility, Decreased strength  Visit Diagnosis: Spastic quadriplegic cerebral palsy (HCC)  Abnormal posture  Contracture of muscle, multiple sites    Problem List Patient Active Problem List   Diagnosis Date Noted  . SIRS (systemic inflammatory response syndrome) (HCC) 08/09/2016  . Nausea vomiting and diarrhea 08/09/2016  . Fecal impaction (HCC) 08/09/2016  . Psychosis (HCC) 11/23/2013  . Paranoia (HCC) 11/22/2013  . Fall from wheelchair 05/29/2013  . Anemia   . 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 Myoclonus 12/23/2012  . 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  Insomnia, unspecified 12/23/2012  . Menorrhagia with regular cycle 11/11/2012  . Mobility impaired 11/11/2012  . Wheelchair bound 11/11/2012    Kelli ChurnBallie, Malorie Bigford Johnson, OTR/L 07/30/2018, 2:12 PM  [facility] [address] [place], [place], [address] Phone: [phone]   Fax:  [phone]  Name: Nancy [name] MRN: [id number] Date of Birth: 1990-10-09

## 2018-08-04 ENCOUNTER — Encounter: Payer: Self-pay | Admitting: Rehabilitative and Restorative Service Providers"

## 2018-08-04 ENCOUNTER — Ambulatory Visit: Payer: Medicare Other | Admitting: Rehabilitative and Restorative Service Providers"

## 2018-08-04 DIAGNOSIS — M6249 Contracture of muscle, multiple sites: Secondary | ICD-10-CM | POA: Diagnosis not present

## 2018-08-04 DIAGNOSIS — R293 Abnormal posture: Secondary | ICD-10-CM | POA: Diagnosis not present

## 2018-08-04 DIAGNOSIS — M6281 Muscle weakness (generalized): Secondary | ICD-10-CM

## 2018-08-04 DIAGNOSIS — G800 Spastic quadriplegic cerebral palsy: Secondary | ICD-10-CM | POA: Diagnosis not present

## 2018-08-04 NOTE — Therapy (Signed)
[facility] [address] [place], [place], [address] Phone: [phone]   Fax:  [phone]  Physical Therapy Treatment  Patient Details  Name: Nancy [name] MRN: [id number] Date of Birth: 08-10-1991 Referring Provider (PT): Nancy Ferrier, MD   Encounter Date: 08/04/2018  PT End of Session - 08/04/18 1344    Visit Number  8    Number of Visits  17    Date for PT Re-Evaluation  08/19/18    Authorization Type  medicare    PT Start Time  0935    PT Stop Time  1020    PT Time Calculation (min)  45 min    Activity Tolerance  Patient tolerated treatment well    Behavior During Therapy  [facility] for tasks assessed/performed       Past Medical History:  Diagnosis Date  . Anemia    h/o iron deficiency  . CP (cerebral palsy) (HCC)    with mild contracture  . IUD (intrauterine device) in place 10/2012  . Menorrhagia with regular cycle 11/11/2012  . Mobility impaired 11/11/2012  . Mood disorder (HCC)   . MR (mental retardation)   . Peripheral vascular disease (HCC) 10/02/2006   bilateral DVT lower legs  . Seizures (HCC)    childhood  . Wheelchair bound    r/t CP    Past Surgical History:  Procedure Laterality Date  . EYE SURGERY     as a child  . HIP SURGERY Right 2003  . INTRAUTERINE DEVICE (IUD) INSERTION N/A 11/12/2012   Procedure: INTRAUTERINE DEVICE (IUD) INSERTION;  Surgeon: Nancy Monday, MD;  Location: [facility];  Service: Gynecology;  Laterality: N/A;  . LEG SURGERY     as a child  . MULTIPLE TOOTH EXTRACTIONS  10/18/10    There were no vitals filed for this visit.  Subjective Assessment - 08/04/18 1342    Subjective  The patient arrives today for a w/c assessment with [name].  She arrives in her power chair.  Nancy [name] (family assistance placement) and patient's father attended session with Nancy [name] from [name].    Patient is accompained by:  Family member   father and Nancy [name] (family placement)   Patient  Stated Goals  Get a stretching program/strengthening, help more with transfers, address equipment needs.     Currently in Pain?  No/denies        THERAPEUTIC ACTIVITIES: Transferring w/c<>mat with max A, sitting edge of mat working on propping on the left side, sitting>supine with dependent movement with LE stretching.  Wheelchair Mgmt: Nancy [name], ATP from [name] attended session to determine what modifications we could do to Nancy [name] chair as she is not due for a new w/c until 2021.  We discussed modifications for scoliosis, L trunk shortening/scoliosis, windswept leg posture, and LE/foot positioning.  Nancy [name] recommended custom seat and back and plans to go to patient's house for custom molding.   Further itemization and LMN to follow.     PT Short Term Goals - 07/14/18 1227      PT SHORT TERM GOAL #1   Title  The patient will have LMN with equipment needs addressed and sent to case manager    Time  4    Period  Weeks    Status  On-going    Target Date  07/20/18      PT SHORT TERM GOAL #2   Title  The patient will maintain sitting edge of mat with UE support x 1 minute.  Baseline  Patient is able to do with superivsion holding with L UE support.     Time  4    Period  Weeks    Status  Achieved      PT SHORT TERM GOAL #3   Title  The patient's caregiver will have initial home stretching program for LE tightness.    Time  4    Period  Weeks    Status  Achieved      PT SHORT TERM GOAL #4   Title  The patient will have equipment needs further assessed.    Time  4    Period  Weeks    Status  Achieved        PT Long Term Goals - 06/20/18 1608      PT LONG TERM GOAL #1   Title  The patient will perform home stretching with caregiver assistance.    Time  8    Period  Weeks    Target Date  08/19/18      PT LONG TERM GOAL #2   Title  The patient will maintain sitting edge of mat x 2 minutes wiht UE support.    Time  8    Period  Weeks    Target Date   08/19/18      PT LONG TERM GOAL #3   Title  Further LTGs to follow based on patient progress.            Plan - 08/04/18 1346    Clinical Impression Statement  Nancy JunesBrandon, ATP from [name] attended session to assess Nancy [name] in her current power wheelchair.  He will f/u with list of modifications that were discussed during our session today so LMN can be written as patient has medical necessity to update current seating system.     PT Treatment/Interventions  ADLs/Self Care Home Management;Balance training;Neuromuscular re-education;Patient/family education;Functional mobility training;Therapeutic activities;Therapeutic exercise;DME Instruction;Manual techniques    PT Next Visit Plan  f/u after Nancy [name]'s visit 08/26/2018 for tone mgmt.      Consulted and Agree with Plan of Care  Patient;Family member/caregiver    Family Member Consulted  Nancy [name]       Patient will benefit from skilled therapeutic intervention in order to improve the following deficits and impairments:  Impaired tone, Decreased activity tolerance, Decreased knowledge of use of DME, Decreased strength, Postural dysfunction, Impaired flexibility, Decreased range of motion, Increased muscle spasms, Decreased mobility, Decreased balance  Visit Diagnosis: Abnormal posture  Contracture of muscle, multiple sites  Muscle weakness (generalized)     Problem List Patient Active Problem List   Diagnosis Date Noted  . SIRS (systemic inflammatory response syndrome) (HCC) 08/09/2016  . Nausea vomiting and diarrhea 08/09/2016  . Fecal impaction (HCC) 08/09/2016  . Psychosis (HCC) 11/23/2013  . Paranoia (HCC) 11/22/2013  . Fall from wheelchair 05/29/2013  . Anemia   . 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 Myoclonus 12/23/2012  . 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  Insomnia, unspecified 12/23/2012  . Menorrhagia with regular cycle 11/11/2012  . Mobility impaired 11/11/2012  . Wheelchair bound 11/11/2012    Nancy [name], PT 08/04/2018, 1:52 PM  [facility] [address] [place], [place], [address] Phone: [phone]   Fax:  [phone]  Name: [name] MRN: [id number] Date of Birth: 1991-04-28

## 2018-08-06 ENCOUNTER — Ambulatory Visit: Payer: Self-pay | Admitting: Rehabilitative and Restorative Service Providers"

## 2018-08-06 ENCOUNTER — Ambulatory Visit: Payer: Medicare Other | Admitting: Occupational Therapy

## 2018-08-11 ENCOUNTER — Encounter: Payer: Self-pay | Admitting: Rehabilitative and Restorative Service Providers"

## 2018-08-11 NOTE — Therapy (Signed)
[facility] [address] [place], [place], [address] Phone: [phone]   Fax:  [phone]  Patient Details  Name: Nancy [name] MRN: [id number] Date of Birth: May 01, 1991   July 11, 2018 Nancy [name], PT [address], [address] [place], [place] [address]  Re:  Letter of Medical Necessity for Nancy [name] Patient:  Nancy [name] Date of Birth: May 01, 1991 Dates of Service:  06/20/18-07/14/18 Diagnosis:  Spastic Quadraplegia  e-mail to : [email or web address],  To Lenetta QuakerBenita Harris, I am writing on behalf of my patient, Nancy [name], to request equipment that is medically necessary for her safety in the home.  The equipment is needed to keep Nancy [name] safe during transfers, reduce caregiver assistance, allow for safe transport to her supported work environment, and Data processing managerimprove hygiene.  Nancy [name] is a 27 year old female with spastic quadriplegia from CP.  She is dependent for transfers w/c<>mat, is w/c dependent (non-ambulatory), and requires assist for all ADLs.    Nancy [name] has recently moved from living at home with her father to an Assisted Family Living Provider named Nancy [name].  Nancy [name] is dependently transferring Nancy [name] from her wheelchair to the bed and from her wheelchair to the car.  In order to take Nancy [name] to appointments, Nancy [name] uses a manual wheelchair to transport her because she does not have a car to accommodate a power wheelchair. Nancy [name] requires a manual wheelchair as a back-up to her power chair.  At times, she is transported to work and the rehabilitation center in her manual wheelchair.  Because insurance has paid for a power wheelchair, they will no longer pay for repairs to [name]'s manual wheelchair.  Clary's brakes on the manual w/c are broken and transportation services will no longer transport Nancy [name] because her chair cannot be properly secured during transit.  This is a  safety hazard. Nancy [name] also requires a handheld shower head.  [name]'s caregiver dependently bathes Nancy [name] and a handheld shower head would increase the ease of bathing, and provide improved hygiene.  A handheld shower head would also greatly increase [name]'s comfort during this ADL.   Please let me know if you have any questions [phone].  Thank you for your time and attention to Nancy [name] needs.  Sincerely,  Nancy Dittyhristina Arlynn Stare, MPT    [name] 08/11/2018, 10:02 AM  [facility] [address] [place], [place], [address] Phone: [phone]   Fax:  [phone]

## 2018-08-13 ENCOUNTER — Encounter: Payer: Self-pay | Admitting: Occupational Therapy

## 2018-08-14 ENCOUNTER — Other Ambulatory Visit: Payer: Self-pay | Admitting: Family Medicine

## 2018-08-25 ENCOUNTER — Other Ambulatory Visit: Payer: Self-pay | Admitting: Family Medicine

## 2018-08-25 NOTE — Telephone Encounter (Signed)
Requesting refill   Klonopin   LOV: 05/26/18  LRF:  05/30/18

## 2018-08-26 ENCOUNTER — Encounter: Payer: Self-pay | Admitting: Physical Medicine & Rehabilitation

## 2018-08-26 ENCOUNTER — Encounter: Payer: Medicare Other | Attending: Physical Medicine & Rehabilitation | Admitting: Physical Medicine & Rehabilitation

## 2018-08-26 VITALS — BP 106/60 | HR 100 | Resp 14

## 2018-08-26 DIAGNOSIS — Z833 Family history of diabetes mellitus: Secondary | ICD-10-CM | POA: Diagnosis not present

## 2018-08-26 DIAGNOSIS — G800 Spastic quadriplegic cerebral palsy: Secondary | ICD-10-CM | POA: Diagnosis not present

## 2018-08-26 DIAGNOSIS — M4134 Thoracogenic scoliosis, thoracic region: Secondary | ICD-10-CM | POA: Diagnosis not present

## 2018-08-26 DIAGNOSIS — F419 Anxiety disorder, unspecified: Secondary | ICD-10-CM | POA: Diagnosis not present

## 2018-08-26 DIAGNOSIS — Z79899 Other long term (current) drug therapy: Secondary | ICD-10-CM | POA: Diagnosis not present

## 2018-08-26 DIAGNOSIS — R569 Unspecified convulsions: Secondary | ICD-10-CM | POA: Diagnosis not present

## 2018-08-26 DIAGNOSIS — F329 Major depressive disorder, single episode, unspecified: Secondary | ICD-10-CM | POA: Insufficient documentation

## 2018-08-26 MED ORDER — BACLOFEN 10 MG PO TABS: 5.0000 mg | ORAL_TABLET | Freq: Three times a day (TID) | ORAL | 3 refills | Status: DC

## 2018-08-26 NOTE — Patient Instructions (Signed)
BACLOFEN:  DAYS 1-4: 5MG  TWICE DAILY DAYS 5-8: 5MG  THREE X DAILY DAYS 9-12: 5-10-5MG  DAYS 13-16: 10-5-10MG  DAYS 17+: 10MG  THREE X DAILY

## 2018-08-26 NOTE — Progress Notes (Signed)
Subjective:    Patient ID: Nancy [name], female    DOB: 04-17-1991, 27 y.o.   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  She lives with her famiily/caregivers.  She requires a powered  wheelchair for basic mobility within and outside of the home. She uses a manual chair when leaving the house for appts, etc.  Nancy [name] is here today for assessment of her spasticity.  She states that she never been on an oral medication for spasticity because of family concerns over side effects. She has had therapy in the past but it has "been awhile" until now that she's had physical therapy. She is currently involved in therapy at [facility].  They are working on her tone as well as trying to improve her wheelchair seating.  It appears that she has had some therapy in the remote past which is included bracing and surgical intervention.  The patient herself is a poor historian so was difficult to discern what had been done previously.  Currently, the patient is not on any medication to address her tone except for Klonopin which is really more for anxiety and mood.   Pain Inventory Average Pain 0 Pain Right Now 5 My pain is intermittent  In the last 24 hours, has pain interfered with the following? General activity 0 Relation with others 0 Enjoyment of life 0 What TIME of day is your pain at its worst? varies Sleep (in general) Fair  Pain is worse with: unsure Pain improves with: n/a Relief from Meds: n/a  Mobility use a wheelchair needs help with transfers  Function disabled: date disabled .  Neuro/Psych bladder control problems bowel control problems tremor spasms  Prior Studies new  Physicians involved in your care new   Family History  Problem Relation Age of Onset  . Cancer Mother   . Diabetes Father   . Alzheimer's disease Maternal  Grandfather   . Heart attack Paternal Grandmother        Died between 83 or 80 years old   Social History   Socioeconomic History  . Marital status: Single    Spouse name: Not on file  . Number of children: Not on file  . Years of education: Not on file  . Highest education level: Not on file  Occupational History  . Not on file  Social Needs  . Financial resource strain: Not on file  . Food insecurity:    Worry: Not on file    Inability: Not on file  . Transportation needs:    Medical: Not on file    Non-medical: Not on file  Tobacco Use  . Smoking status: Never Smoker  . Smokeless tobacco: Never Used  Substance and Sexual Activity  . Alcohol use: No    Comment: Pt denies  . Drug use: No    Comment: Pt denies  . Sexual activity: Never    Comment: lives in a group home.  single.  Lifestyle  . Physical activity:    Days per week: Not on file    Minutes per session: Not on file  . Stress: Not on file  Relationships  . Social connections:    Talks on phone: Not on file    Gets together: Not on file    Attends religious service: Not on file    Active member of club or organization: Not on  file    Attends meetings of clubs or organizations: Not on file    Relationship status: Not on file  Other Topics Concern  . Not on file  Social History Narrative   Lives in an apartment with [facility] (assisted living)   Left handed    Past Surgical History:  Procedure Laterality Date  . EYE SURGERY     as a child  . HIP SURGERY Right 2003  . INTRAUTERINE DEVICE (IUD) INSERTION N/A 11/12/2012   Procedure: INTRAUTERINE DEVICE (IUD) INSERTION;  Surgeon: Sherron MondayJody Bovard, MD;  Location: [facility];  Service: Gynecology;  Laterality: N/A;  . LEG SURGERY     as a child  . MULTIPLE TOOTH EXTRACTIONS  10/18/10   Past Medical History:  Diagnosis Date  . Anemia    h/o iron deficiency  . CP (cerebral palsy) (HCC)    with mild contracture  . IUD (intrauterine device) in place 10/2012  .  Menorrhagia with regular cycle 11/11/2012  . Mobility impaired 11/11/2012  . Mood disorder (HCC)   . MR (mental retardation)   . Peripheral vascular disease (HCC) 10/02/2006   bilateral DVT lower legs  . Seizures (HCC)    childhood  . Wheelchair bound    r/t CP   BP 106/60   Pulse 100   Resp 14   SpO2 98%   Opioid Risk Score:   Fall Risk Score:  `1  Depression screen PHQ 2/9  Depression screen PHQ 2/9 11/28/2016  Decreased Interest 0  Down, Depressed, Hopeless 0  PHQ - 2 Score 0  Altered sleeping 0  Tired, decreased energy 0  Change in appetite 0  Feeling bad or failure about yourself  0  Trouble concentrating 0  Moving slowly or fidgety/restless 0  Suicidal thoughts 0  PHQ-9 Score 0  Difficult doing work/chores Not difficult at all    Review of Systems  Constitutional: Negative.   HENT: Negative.   Eyes: Negative.   Respiratory: Negative.   Cardiovascular: Negative.   Gastrointestinal:       Neurogenic bowel   Endocrine: Negative.   Genitourinary: Negative.        Neurogenic bladder  Musculoskeletal: Positive for gait problem.       Spasms   Skin: Negative.   Allergic/Immunologic: Negative.   Neurological: Positive for tremors.  Hematological: Negative.   Psychiatric/Behavioral: Negative.        Objective:   Physical Exam   General: Alert and oriented x 3, No apparent distress HEENT: Head is normocephalic, atraumatic, PERRLA, EOMI, sclera anicteric, oral mucosa pink and moist, dentition intact, ext ear canals clear,  Neck: Supple without JVD or lymphadenopathy Heart: Reg rate and rhythm. No murmurs rubs or gallops Chest: CTA bilaterally without wheezes, rales, or rhonchi; no distress Abdomen: Soft, non-tender, non-distended, bowel sounds positive. Extremities: No clubbing, cyanosis, or edema. Pulses are 2+ Skin: Clean and intact without signs of breakdown Neuro: Patient is alert and oriented to self and place.  Was able to answer basic biographical  questions.  Speech very dysarthric.  She has 3 to 3+ out of 5 strength in the upper extremities.  Tone is in the flexor adducted pattern in both uppers with the left arm being 1+ out of 4 except for the biceps which were 2 out of 4 on the modified Ashworth scale.  Right upper extremity closer to 2 out of 4 except for the biceps which were 2+ to 3 out of 4.  Lower extremity spasticity was more severe.  Hip abductors and hamstrings were 3-4 to almost 4 out of 4 bilaterally.  Calfs were tight as well but she was able to maintain some ankle dorsiflexion given the fact that her knees were bent.  Volitional movement in the lower extremities was potentially 1-2 out of 5 but masked by the tone.  She was able to sense pain in all 4.  Reflexes are 3+ throughout. Musculoskeletal: She has significant dextroscoliosis of the thoracolumbar spine.  Ankle of the scoliosis was approximately 120 degrees.  There is also leftward rotation anteriorly of the spine and pelvis which results in her being in a twisted and bent position in her chair. Psych: Pt's affect is appropriate. Pt is cooperative        Assessment & Plan:  1.  Cerebral palsy with spastic tetraplegia 2.  Severe dextroscoliosis of the thoracolumbar spine 3.  History of seizures 4.  History of psychosis/mood disorder which appears to be well compensated at present   Plan: 1.  Spasticity is so significant that she will need to have a trial of oral medication first to see if we can decrease her overall tone.  I decided to start with baclofen which we will start at a 5 mg twice daily dose.  Will titrate up to 10 mg 3 times daily over the next 2 and half to 3 weeks time.  Discussed potential side effects of medication including fatigue and altered mental status as well as blood pressure changes. 2.  Continue with outpatient therapies at [facility].  Ongoing range of motion and spasticity management there is important.  They are also addressing  appropriate seating for a powered wheelchair.  She did not have a wheelchair with her today.  She simply had a transport type of chair with some trunk support. 3.  Potentially will be a candidate for Botox especially in the lower extremities to assist with the management of her tone. 4.  A baclofen pump also could be an option here given the widespread nature of her spasticity. 5.  We also discussed bowel and bladder management briefly but this was not the focus today  30 minutes was spent with the patient and her family in the room.  I will see her back in the office in about 1 month's time.  All questions were encouraged and answered.

## 2018-09-08 ENCOUNTER — Encounter: Payer: Self-pay | Admitting: Rehabilitative and Restorative Service Providers"

## 2018-09-08 ENCOUNTER — Ambulatory Visit: Payer: Medicare Other | Attending: Family Medicine | Admitting: Rehabilitative and Restorative Service Providers"

## 2018-09-08 DIAGNOSIS — M6249 Contracture of muscle, multiple sites: Secondary | ICD-10-CM | POA: Insufficient documentation

## 2018-09-08 DIAGNOSIS — R293 Abnormal posture: Secondary | ICD-10-CM

## 2018-09-08 DIAGNOSIS — M6281 Muscle weakness (generalized): Secondary | ICD-10-CM | POA: Diagnosis not present

## 2018-09-08 DIAGNOSIS — G800 Spastic quadriplegic cerebral palsy: Secondary | ICD-10-CM | POA: Insufficient documentation

## 2018-09-08 NOTE — Therapy (Signed)
[facility] [address] [place], [place], [address] Phone: [phone]   Fax:  [phone]  Physical Therapy Treatment and Discharge Summary  Patient Details  Name: Nancy [name] MRN: [id number] Date of Birth: 09-20-1990 Referring Provider (PT): Jenna Luo, MD   Encounter Date: 09/08/2018  PT End of Session - 09/08/18 0856    Visit Number  9    Number of Visits  17    Date for PT Re-Evaluation  08/19/18    Authorization Type  medicare    PT Start Time  0850    PT Stop Time  0930    PT Time Calculation (min)  40 min    Activity Tolerance  Patient tolerated treatment well    Behavior During Therapy  [facility] for tasks assessed/performed       Past Medical History:  Diagnosis Date  . Anemia    h/o iron deficiency  . CP (cerebral palsy) (HCC)    with mild contracture  . IUD (intrauterine device) in place 10/2012  . Menorrhagia with regular cycle 11/11/2012  . Mobility impaired 11/11/2012  . Mood disorder ([place])   . MR (mental retardation)   . Peripheral vascular disease ([place]) 10/02/2006   bilateral DVT lower legs  . Seizures ([place])    childhood  . Wheelchair bound    r/t CP    Past Surgical History:  Procedure Laterality Date  . EYE SURGERY     as a child  . HIP SURGERY Right 2003  . INTRAUTERINE DEVICE (IUD) INSERTION N/A 11/12/2012   Procedure: INTRAUTERINE DEVICE (IUD) INSERTION;  Surgeon: Thornell Sartorius, MD;  Location: [facility];  Service: Gynecology;  Laterality: N/A;  . LEG SURGERY     as a child  . MULTIPLE TOOTH EXTRACTIONS  10/18/10    There were no vitals filed for this visit.  Subjective Assessment - 09/08/18 0854    Subjective  The patient saw Dr. Naaman Plummer on 08/26/2018 and began on tone medications.  She notes the medicine is making her sleepy.  She takes it in the morning and at night.  She states "I'm not as tight."  She also notes she can balance better in standing.  She has been taking small  steps with Raquel at home.      Patient Stated Goals  Get a stretching program/strengthening, help more with transfers, address equipment needs.     Currently in Pain?  No/denies         [facility] PT Assessment - 09/08/18 0911      PROM   Overall PROM Comments  L hip flexion passive stretcing to 90 degrees, L knee -50 degrees from full extension.  R hip flexion passive stretch to 95 degrees and L knee extends to -44 from full extension.                    [facility] PT Treatment/Exercise - 09/08/18 0911      Transfers   Transfers  Squat Pivot Transfers    Squat Pivot Transfers  1: +1 Total assist    Squat Pivot Transfer Details (indicate cue type and reason)  w/c<>mat    Comments  The patient is able to flex at her trunk (in the past, we could not consider a sliding board because [name] would go into extensor tone when attempting to use.  Today, she can flex forward without muscle spasms, however her w/c bar does not move and we could not try the  sliding board.  [name] also had a bladder accident and we did not want to scoot too much due to wet clothes, etc.       Self-Care   Self-Care  Other Self-Care Comments    Other Self-Care Comments   The patient notes significantly less spasms.  Discussed home stretching and patient notes she is doing some stretching.    Discussed patient request for a walker as she notes she is taking a coupleof steps with Raquel in the home.   PT discussed that it may be beneficial for ADLs, however PT would have to further assess this moving forward due to dec'd ability to grip with UEs and dec'd functional use of arms.                PT Short Term Goals - 09/08/18 1207      PT SHORT TERM GOAL #1   Title  The patient will have LMN with equipment needs addressed and sent to case manager    Time  4    Period  Weeks    Status  Achieved      PT SHORT TERM GOAL #2   Title  The patient will maintain sitting edge of mat with UE support x 1  minute.    Baseline  Patient is able to do with superivsion holding with L UE support.     Time  4    Period  Weeks    Status  Achieved      PT SHORT TERM GOAL #3   Title  The patient's caregiver will have initial home stretching program for LE tightness.    Time  4    Period  Weeks    Status  Achieved      PT SHORT TERM GOAL #4   Title  The patient will have equipment needs further assessed.    Time  4    Period  Weeks    Status  Achieved        PT Long Term Goals - 09/08/18 1207      PT LONG TERM GOAL #1   Title  The patient will perform home stretching with caregiver assistance.    Time  8    Period  Weeks    Status  Achieved      PT LONG TERM GOAL #2   Title  The patient will maintain sitting edge of mat x 2 minutes wiht UE support.    Baseline  Did not test today    Time  8    Period  Weeks    Status  Deferred      PT LONG TERM GOAL #3   Title  Further LTGs to follow based on patient progress.    Status  Deferred            Plan - 09/08/18 1216    Clinical Impression Statement  The patient notes less spasms and has more movement during transfers through her trunk.  Per discussion, the patient and caregiver feel fine with current stretching.  PT to complete LMN for power chair updates.  D/c'ing patient today with HEP and equipment needs addressed.     PT Treatment/Interventions  ADLs/Self Care Home Management;Balance training;Neuromuscular re-education;Patient/family education;Functional mobility training;Therapeutic activities;Therapeutic exercise;DME Instruction;Manual techniques    PT Next Visit Plan  d/c.    Consulted and Agree with Plan of Care  Patient;Family member/caregiver    Family Member Consulted  Raquel       Patient  will benefit from skilled therapeutic intervention in order to improve the following deficits and impairments:  Impaired tone, Decreased activity tolerance, Decreased knowledge of use of DME, Decreased strength, Postural  dysfunction, Impaired flexibility, Decreased range of motion, Increased muscle spasms, Decreased mobility, Decreased balance  Visit Diagnosis: Abnormal posture  Contracture of muscle, multiple sites  Muscle weakness (generalized)  Spastic quadriplegic cerebral palsy ([place])  PHYSICAL THERAPY DISCHARGE SUMMARY  Visits from Start of Care: 9   Current functional level related to goals / functional outcomes: See abive   Remaining deficits: Chronic tightness and muscle shortening related to CP Muscle spasticity, Contractures of joints Trunk shortening/scoliosis Dependent for all transfers   Education / Equipment: Home stretching, equipment recommendations sent to [facility] for hoyer lift, handheld shower attachment.  Updated power chair recommendations sent to [employer].  Plan: Patient agrees to discharge.  Patient goals were partially met. Patient is being discharged due to meeting the stated rehab goals.  ?????       Thank you for the referral of this patient. Rudell Cobb, MPT   [facility], [place] 09/08/2018, 12:18 PM  [facility] [address] [place], [place], [address] Phone: [phone]   Fax:  [phone]  Name: BRITANNY MARKSBERRY MRN: [id number] Date of Birth: 12-14-1990

## 2018-09-19 ENCOUNTER — Encounter: Payer: Self-pay | Admitting: Rehabilitative and Restorative Service Providers"

## 2018-09-19 NOTE — Therapy (Signed)
[facility] [address] [place], [place], [address] Phone: [phone]   Fax:  [phone]   August 28, 2018 Margretta Ditty, PT [address] [place], [place] [address]  Re:  Letter of Medical Necessity for Nancy [name] Patient:  Nancy [name] Date of Birth: 1991-03-10 Dates of Service:  06/20/18-07/14/18 Diagnosis:  Spastic Quadraplegia, Cerebral Palsy  To Whom It May Concern, Nancy [name] is a 28 year old female with spastic quadriplegia from CP.  She is dependent for transfers w/c<>mat, is w/c dependent (non-ambulatory), and requires assist for all ADLs.    Nancy [name] needs updates to her power wheelchair that are medically necessary for safety during mobility, safe transport, improved posture for respiration, improved posture for digestion, support for postural abnormalities, and to improve positioning for ADLs.   These updates include: Description Justification  Hip belt To provide pelvic stability, stabilize muscle tone, prevent pelvic rotation, and reduce sliding forward in wheelchair.  Leg harness To maintain LE positioning due to increased tone and spasticity and support feet on foot support.  Will help protect feet.  Ankle huggers To maintain LE positioning due to increased tone and spasticity and will support feet on foot supports.  Shoulder harness To decreased forward movement of shoulders, to improve postural alignment.  Needed for upright posture to facilitate activities of daily living, support posture for respiration and eating.  Headrest To provide head support when in the wheelchair, improve feeding, improve respiration, safety in wheelchair.  Headrest mount Required to mount headrest to wheelchair.  Glidewear head rest cover To maintain patient hygiene.  Mounting block Mounting hardware required for updates to power wheelchair.   Custom seat cushion Nancy [name] has muscle shortening, scoliosis  and muscle tone that cannot be accommodated by standard wheelchair cushions.  Custom seating will stabilize the pelvis, accommodate for pelvic obliquity and deformities.  Also need custom seating to increase pressure distribution.   Custom back cushion Nancy [name] has muscle shortening, scoliosis and muscle tone that cannot be accommodated with standard wheelchair backs.  A custom seat back will improve postural control, reduce muscle tone/spasticity, accommodate postural deformity, improve UE movement, provide lateral trunk support, provide posterior trunk support and align Nancy [name] to midline.  Also needed to reduce pressure over bony prominences due to scoliosis.  Shroud assy std front door  To allow support for mounting footplate and calf pads.  Footplate assy ctr mount Provide foot support, accommodate ankle positioning, decrease tone.    Calf pad small  To provide support for legs and accommodate muscle tone and positioning.  Armrest assy right and left To provide support for UEs while seated in the wheelchair.  Will need to swing away for dependent transfers.  Armpad  For comfort during use of power wheelchair and to distribute pressure over bony prominences of UEs while in wheelchair.  Legrest assy center [employer] is required for power wheelchair updates.    Please let me know if you have any questions [phone].  Thank you for your time and attention to Nancy [name]'s needs.  Sincerely,  Margretta Ditty, MPT    Shearon Clonch 09/19/2018, 4:39 PM  [facility] [address] [place], [place], [address] Phone: [phone]   Fax:  [phone]

## 2018-09-23 ENCOUNTER — Encounter: Payer: Medicare Other | Admitting: Physical Medicine & Rehabilitation

## 2018-09-24 ENCOUNTER — Encounter: Payer: Self-pay | Admitting: Physical Medicine & Rehabilitation

## 2018-09-24 ENCOUNTER — Encounter: Payer: Medicare Other | Attending: Physical Medicine & Rehabilitation | Admitting: Physical Medicine & Rehabilitation

## 2018-09-24 DIAGNOSIS — F419 Anxiety disorder, unspecified: Secondary | ICD-10-CM | POA: Diagnosis not present

## 2018-09-24 DIAGNOSIS — Z79899 Other long term (current) drug therapy: Secondary | ICD-10-CM | POA: Diagnosis not present

## 2018-09-24 DIAGNOSIS — G800 Spastic quadriplegic cerebral palsy: Secondary | ICD-10-CM | POA: Insufficient documentation

## 2018-09-24 DIAGNOSIS — M4134 Thoracogenic scoliosis, thoracic region: Secondary | ICD-10-CM | POA: Insufficient documentation

## 2018-09-24 DIAGNOSIS — F329 Major depressive disorder, single episode, unspecified: Secondary | ICD-10-CM | POA: Insufficient documentation

## 2018-09-24 DIAGNOSIS — Z833 Family history of diabetes mellitus: Secondary | ICD-10-CM | POA: Insufficient documentation

## 2018-09-24 DIAGNOSIS — R569 Unspecified convulsions: Secondary | ICD-10-CM | POA: Insufficient documentation

## 2018-09-24 MED ORDER — BACLOFEN 20 MG PO TABS: 20.0000 mg | ORAL_TABLET | Freq: Three times a day (TID) | ORAL | 4 refills | Status: DC

## 2018-09-24 NOTE — Patient Instructions (Addendum)
BACLOFEN:  10-10-15MG  FOR 4 DAYS  THEN 15-10-15MG  FOR 4 DAYS  THEN 15MG  THREE X DAILY FOR 4 DAYS  THEN 15-15-20MG  FOR 4 DAYS  THEN 20-15-20MG  FOR 4 DAYS  THEN 20MG  THREE X DAILY THEREAFTER.

## 2018-09-24 NOTE — Progress Notes (Signed)
Subjective:    Patient ID: Nancy [name], female    DOB: 06-07-1991, 28 y.o.   MRN: [id number]  HPI   Nancy [name] is here in follow up of her of cerebral palsy and associated spasticity. The baclofen has helped with her spasticity. It made her sleepy at first but she's become used to it. She has titrated up to the 10mg  TID as directed. She is using her left upper more easily for maneuvering of her w/c. Mom notes that she still "locks" up during transfers, almost like a jack knife.   She has had two sessions of therapy since I last saw her. W/c is still in process.   Bowel and bladder function unchanged.   Pain Inventory Average Pain 0 Pain Right Now 0 My pain is na  In the last 24 hours, has pain interfered with the following? General activity 0 Relation with others 0 Enjoyment of life 0 What TIME of day is your pain at its worst? na Sleep (in general) Fair  Pain is worse with: na Pain improves with: na Relief from Meds: na  Mobility how many minutes can you walk? 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  no  Physicians involved in your care Any changes since last visit?  no   Family History  Problem Relation Age of Onset  . Cancer Mother   . Diabetes Father   . Alzheimer's disease Maternal Grandfather   . Heart attack Paternal Grandmother        Died between 1 or 23 years old   Social History   Socioeconomic History  . Marital status: Single    Spouse name: Not on file  . Number of children: Not on file  . Years of education: Not on file  . Highest education level: Not on file  Occupational History  . Not on file  Social Needs  . Financial resource strain: Not on file  . Food insecurity:    Worry: Not on file    Inability: Not on file  . Transportation needs:    Medical: Not on file    Non-medical: Not  on file  Tobacco Use  . Smoking status: Never Smoker  . Smokeless tobacco: Never Used  Substance and Sexual Activity  . Alcohol use: No    Comment: Pt denies  . Drug use: No    Comment: Pt denies  . Sexual activity: Never    Comment: lives in a group home.  single.  Lifestyle  . Physical activity:    Days per week: Not on file    Minutes per session: Not on file  . Stress: Not on file  Relationships  . Social connections:    Talks on phone: Not on file    Gets together: Not on file    Attends religious service: Not on file    Active member of club or organization: Not on file    Attends meetings of clubs or organizations: Not on file    Relationship status: Not on file  Other Topics Concern  . Not on file  Social History Narrative   Lives in an apartment with [facility] (assisted living)   Left handed    Past Surgical History:  Procedure Laterality Date  . EYE SURGERY     as a child  . HIP SURGERY Right 2003  . INTRAUTERINE DEVICE (  IUD) INSERTION N/A 11/12/2012   Procedure: INTRAUTERINE DEVICE (IUD) INSERTION;  Surgeon: Sherron Monday, MD;  Location: [facility];  Service: Gynecology;  Laterality: N/A;  . LEG SURGERY     as a child  . MULTIPLE TOOTH EXTRACTIONS  10/18/10   Past Medical History:  Diagnosis Date  . Anemia    h/o iron deficiency  . CP (cerebral palsy) (HCC)    with mild contracture  . IUD (intrauterine device) in place 10/2012  . Menorrhagia with regular cycle 11/11/2012  . Mobility impaired 11/11/2012  . Mood disorder (HCC)   . MR (mental retardation)   . Peripheral vascular disease (HCC) 10/02/2006   bilateral DVT lower legs  . Seizures (HCC)    childhood  . Wheelchair bound    r/t CP   BP 105/71   Pulse 85   SpO2 100%   Opioid Risk Score:   Fall Risk Score:  `1  Depression screen PHQ 2/9  Depression screen [facility] 2/9 09/24/2018 08/26/2018 11/28/2016  Decreased Interest 1 1 0  Down, Depressed, Hopeless 1 1 0  PHQ - 2 Score 2 2 0  Altered sleeping - 1 0    Tired, decreased energy - 2 0  Change in appetite - 0 0  Feeling bad or failure about yourself  - 1 0  Trouble concentrating - 3 0  Moving slowly or fidgety/restless - 1 0  Suicidal thoughts - 0 0  PHQ-9 Score - 10 0  Difficult doing work/chores - - Not difficult at all    Review of Systems  Constitutional: Negative.   HENT: Negative.   Eyes: Negative.   Respiratory: Negative.   Cardiovascular: Negative.   Gastrointestinal: Negative.   Endocrine: Negative.   Genitourinary: Negative.   Musculoskeletal: Negative.   Skin: Negative.   Allergic/Immunologic: Negative.   Neurological: Negative.   Hematological: Negative.   Psychiatric/Behavioral: Negative.   All other systems reviewed and are negative.      Objective:   Physical Exam General: No acute distress HEENT: EOMI, oral membranes moist Cards: reg rate  Chest: normal effort Abdomen: Soft, NT, ND Skin: dry, intact Extremities: no edema  Neuro: Patient is alert and oriented to self and place.  Was able to answer basic biographical questions.  Speech very dysarthric.  She has 3 to 3+ out of 5 strength in the upper extremities.  Tone is in the flexor adducted pattern in both uppers with the left arm TR to 1/4.  Right upper extremity   2 out of 4  biceps ,  2+ to 3 out of 4 shoulder girdle and pecs. .    Hip abductors and hamstrings are 3/4 bilaterally.    limited motor in LE's.  She was able to sense pain in all 4.  Reflexes are 3+ throughout. Musculoskeletal: She has significant dextroscoliosis of the thoracolumbar spine.  Ankle of the scoliosis was approximately 120 degrees.  There is also leftward rotation anteriorly of the spine and pelvis which results in her being in a twisted and bent position in her chair.--no changes today Psych: Pt's affect is appropriate. Pt is cooperative        Assessment & Plan:  1.  Cerebral palsy with spastic tetraplegia 2.  Severe dextroscoliosis of the thoracolumbar spine 3.   History of seizures 4.  History of psychosis/mood disorder which appears to be well compensated at present   Plan: 1.  Titrate baclofen slowly to 20mg  TID. Titration schedule provided.  2.  [facility].   3.  will set up for botox 400u right pecs, shoulder girdle, biceps at next visit 4.  A baclofen pump also could be an option here given the widespread nature of her spasticity. 5.  w/c per therapies.    15 minutes was spent with the patient and her family in the room.  I will see her back in the office in about 1 month's time.  All questions were encouraged and answered.

## 2018-10-09 ENCOUNTER — Other Ambulatory Visit: Payer: Self-pay | Admitting: Family Medicine

## 2018-10-10 NOTE — Telephone Encounter (Signed)
Requesting refill    Klonopin  LOV: 05/26/18  LRF: 08/26/18

## 2018-10-21 ENCOUNTER — Other Ambulatory Visit: Payer: Self-pay

## 2018-10-21 ENCOUNTER — Encounter (HOSPITAL_BASED_OUTPATIENT_CLINIC_OR_DEPARTMENT_OTHER): Payer: Self-pay | Admitting: *Deleted

## 2018-10-21 NOTE — Progress Notes (Signed)
Spoke with patients caregiver via telephone for pre op interview. Patient wheelchair bound, caregiver Raquel stated she is able to transfer the patient. NPO after MN. Patient to take Abilify, Baclofen and Klonopin AM of surgery with a sip of water. Father is legal guardian and will be there to sign consent. Will need CBC and UPT AM of surgery. Arrival time 49. Caregiver and father will need to come back for pre op.

## 2018-10-22 NOTE — H&P (Signed)
Nancy [name] is an 28 y.o. female G0 with spastic quadraperesis - for EUA/pap, IUD remove and replace.  Pt is wheelchair bound - without IUD had menorrhagia - difficulty caring for herself.  Also difficulty with routine screening.  D/W pt and guardian r/b/a of EUA and IUD placement and for contra.    Pertinent Gynecological History: G0 IUD placed 11/12/2012 No abn pap No STD  Menstrual History:  No LMP recorded. (Menstrual status: IUD).    Past Medical History:  Diagnosis Date  . Anemia    h/o iron deficiency  . CP (cerebral palsy) (HCC)    with mild contracture  . IUD (intrauterine device) in place 10/2012  . Menorrhagia with regular cycle 11/11/2012  . Mobility impaired 11/11/2012  . Mood disorder (HCC)   . MR (mental retardation)   . Peripheral vascular disease (HCC) 10/02/2006   bilateral DVT lower legs  . Seizures (HCC)    childhood  . Wheelchair bound    r/t CP    Past Surgical History:  Procedure Laterality Date  . EYE SURGERY     as a child  . HIP SURGERY Right 2003  . INTRAUTERINE DEVICE (IUD) INSERTION N/A 11/12/2012   Procedure: INTRAUTERINE DEVICE (IUD) INSERTION;  Surgeon: Sherron Monday, MD;  Location: [facility];  Service: Gynecology;  Laterality: N/A;  . LEG SURGERY     as a child  . MULTIPLE TOOTH EXTRACTIONS  10/18/10    Family History  Problem Relation Age of Onset  . Cancer Mother   . Diabetes Father   . Alzheimer's disease Maternal Grandfather   . Heart attack Paternal Grandmother        Died between 29 or 60 years old  CVA, CRF  Social History:  reports that she has never smoked. She has never used smokeless tobacco. She reports that she does not drink alcohol or use drugs. dingle, office worker/ day program  Allergies: No Known Allergies  Meds Clonazepam, ariprazole, glycopyrralate, ipatripium bromide, miralax, Zantac  Review of Systems  Constitutional: Negative.   HENT: Negative.   Eyes: Negative.   Respiratory: Negative.    Cardiovascular: Negative.   Gastrointestinal: Negative.   Genitourinary: Negative.   Musculoskeletal:       Spastic quadraparesis  Skin: Negative.   Neurological: Negative.   Psychiatric/Behavioral: Negative.     Height 5' (1.524 m), weight 49.9 kg. Physical Exam  Constitutional: She is oriented to person, place, and time. She appears well-developed and well-nourished.  HENT:  Head: Normocephalic and atraumatic.  Cardiovascular: Normal rate and regular rhythm.  Respiratory: Effort normal and breath sounds normal. No respiratory distress. She has no wheezes.  GI: Soft. Bowel sounds are normal. There is no abdominal tenderness.  Musculoskeletal:        General: Deformity present.  Neurological: She is alert and oriented to person, place, and time.  Skin: Skin is warm and dry.  Psychiatric: She has a normal mood and affect. Her behavior is normal.    No results found for this or any previous visit (from the past 24 hour(s)).  No results found.  Assessment/Plan: 27yo G0 for IUD replacement and pap exam - OR necessary to pt's lack of mobility and control of extremities D/w pt and caregiver/guardian r/b/a of procedure.  Agree, will proceed.    Nancy [name] 10/22/2018, 1:52 PM

## 2018-10-23 ENCOUNTER — Other Ambulatory Visit: Payer: Self-pay

## 2018-10-23 ENCOUNTER — Other Ambulatory Visit (HOSPITAL_BASED_OUTPATIENT_CLINIC_OR_DEPARTMENT_OTHER): Payer: Self-pay | Admitting: Obstetrics and Gynecology

## 2018-10-23 ENCOUNTER — Ambulatory Visit (HOSPITAL_COMMUNITY): Payer: Medicare Other

## 2018-10-23 ENCOUNTER — Ambulatory Visit (HOSPITAL_BASED_OUTPATIENT_CLINIC_OR_DEPARTMENT_OTHER)
Admission: RE | Admit: 2018-10-23 | Discharge: 2018-10-23 | Disposition: A | Discharge status: Home / Self Care | Payer: Medicare Other | Attending: Obstetrics and Gynecology | Admitting: Obstetrics and Gynecology

## 2018-10-23 ENCOUNTER — Ambulatory Visit (HOSPITAL_BASED_OUTPATIENT_CLINIC_OR_DEPARTMENT_OTHER): Payer: Medicare Other | Admitting: Anesthesiology

## 2018-10-23 ENCOUNTER — Encounter (HOSPITAL_BASED_OUTPATIENT_CLINIC_OR_DEPARTMENT_OTHER): Payer: Self-pay | Admitting: Anesthesiology

## 2018-10-23 ENCOUNTER — Encounter (HOSPITAL_BASED_OUTPATIENT_CLINIC_OR_DEPARTMENT_OTHER)
Admission: RE | Disposition: A | Discharge status: Home / Self Care | Payer: Self-pay | Source: Home / Self Care | Attending: Obstetrics and Gynecology

## 2018-10-23 DIAGNOSIS — F79 Unspecified intellectual disabilities: Secondary | ICD-10-CM | POA: Diagnosis not present

## 2018-10-23 DIAGNOSIS — F39 Unspecified mood [affective] disorder: Secondary | ICD-10-CM | POA: Insufficient documentation

## 2018-10-23 DIAGNOSIS — Z30430 Encounter for insertion of intrauterine contraceptive device: Secondary | ICD-10-CM | POA: Diagnosis not present

## 2018-10-23 DIAGNOSIS — G800 Spastic quadriplegic cerebral palsy: Secondary | ICD-10-CM | POA: Insufficient documentation

## 2018-10-23 DIAGNOSIS — I739 Peripheral vascular disease, unspecified: Secondary | ICD-10-CM | POA: Diagnosis not present

## 2018-10-23 DIAGNOSIS — Z86718 Personal history of other venous thrombosis and embolism: Secondary | ICD-10-CM | POA: Insufficient documentation

## 2018-10-23 DIAGNOSIS — M419 Scoliosis, unspecified: Secondary | ICD-10-CM | POA: Insufficient documentation

## 2018-10-23 DIAGNOSIS — Z975 Presence of (intrauterine) contraceptive device: Secondary | ICD-10-CM

## 2018-10-23 DIAGNOSIS — Z993 Dependence on wheelchair: Secondary | ICD-10-CM | POA: Diagnosis not present

## 2018-10-23 DIAGNOSIS — Z30433 Encounter for removal and reinsertion of intrauterine contraceptive device: Secondary | ICD-10-CM | POA: Diagnosis not present

## 2018-10-23 DIAGNOSIS — G8250 Quadriplegia, unspecified: Secondary | ICD-10-CM | POA: Diagnosis not present

## 2018-10-23 DIAGNOSIS — Z79899 Other long term (current) drug therapy: Secondary | ICD-10-CM | POA: Insufficient documentation

## 2018-10-23 DIAGNOSIS — Z30432 Encounter for removal of intrauterine contraceptive device: Secondary | ICD-10-CM | POA: Diagnosis not present

## 2018-10-23 DIAGNOSIS — N920 Excessive and frequent menstruation with regular cycle: Secondary | ICD-10-CM | POA: Diagnosis not present

## 2018-10-23 DIAGNOSIS — G839 Paralytic syndrome, unspecified: Secondary | ICD-10-CM | POA: Diagnosis not present

## 2018-10-23 DIAGNOSIS — T8332XA Displacement of intrauterine contraceptive device, initial encounter: Secondary | ICD-10-CM | POA: Diagnosis not present

## 2018-10-23 HISTORY — PX: INTRAUTERINE DEVICE (IUD) INSERTION: SHX5877

## 2018-10-23 LAB — POCT PREGNANCY, URINE: Preg Test, Ur: NEGATIVE

## 2018-10-23 LAB — CBC
HCT: 44 % (ref 36.0–46.0)
Hemoglobin: 13.9 g/dL (ref 12.0–15.0)
MCH: 31.4 pg (ref 26.0–34.0)
MCHC: 31.6 g/dL (ref 30.0–36.0)
MCV: 99.3 fL (ref 80.0–100.0)
Platelets: 289 10*3/uL (ref 150–400)
RBC: 4.43 MIL/uL (ref 3.87–5.11)
RDW: 12.8 % (ref 11.5–15.5)
WBC: 7.4 10*3/uL (ref 4.0–10.5)
nRBC: 0 % (ref 0.0–0.2)

## 2018-10-23 SURGERY — EXAM UNDER ANESTHESIA
Anesthesia: Monitor Anesthesia Care | Site: Vagina

## 2018-10-23 MED ORDER — PROPOFOL 10 MG/ML IV BOLUS
INTRAVENOUS | Status: AC
  Filled 2018-10-23: qty 40

## 2018-10-23 MED ORDER — MIDAZOLAM HCL 2 MG/2ML IJ SOLN
INTRAMUSCULAR | Status: AC
  Filled 2018-10-23: qty 2

## 2018-10-23 MED ORDER — WHITE PETROLATUM EX OINT
TOPICAL_OINTMENT | CUTANEOUS | Status: AC
  Filled 2018-10-23: qty 5

## 2018-10-23 MED ORDER — FENTANYL CITRATE (PF) 100 MCG/2ML IJ SOLN
INTRAMUSCULAR | Status: DC | PRN
  Administered 2018-10-23 (×2): 25 ug via INTRAVENOUS

## 2018-10-23 MED ORDER — LACTATED RINGERS IV SOLN
INTRAVENOUS | Status: DC
  Filled 2018-10-23: qty 1000

## 2018-10-23 MED ORDER — LACTATED RINGERS IV SOLN
INTRAVENOUS | Status: DC
  Administered 2018-10-23: 08:00:00 via INTRAVENOUS
  Filled 2018-10-23: qty 1000

## 2018-10-23 MED ORDER — IBUPROFEN 600 MG PO TABS: 600.0000 mg | ORAL_TABLET | Freq: Four times a day (QID) | ORAL | 1 refills | Status: DC | PRN

## 2018-10-23 MED ORDER — LIDOCAINE 2% (20 MG/ML) 5 ML SYRINGE
INTRAMUSCULAR | Status: DC | PRN
  Administered 2018-10-23: 25 mg via INTRAVENOUS

## 2018-10-23 MED ORDER — SODIUM CHLORIDE 0.9 % IR SOLN
Status: DC | PRN
  Administered 2018-10-23: 3000 mL

## 2018-10-23 MED ORDER — PROPOFOL 500 MG/50ML IV EMUL
INTRAVENOUS | Status: DC | PRN
  Administered 2018-10-23: 200 ug/kg/min via INTRAVENOUS

## 2018-10-23 MED ORDER — FENTANYL CITRATE (PF) 100 MCG/2ML IJ SOLN
INTRAMUSCULAR | Status: AC
  Filled 2018-10-23: qty 2

## 2018-10-23 MED ORDER — FENTANYL CITRATE (PF) 100 MCG/2ML IJ SOLN
25.0000 ug | INTRAMUSCULAR | Status: DC | PRN
  Filled 2018-10-23: qty 1

## 2018-10-23 MED ORDER — LIDOCAINE HCL 1 % IJ SOLN
INTRAMUSCULAR | Status: DC | PRN
  Administered 2018-10-23: 10 mL

## 2018-10-23 MED ORDER — ONDANSETRON HCL 4 MG/2ML IJ SOLN
INTRAMUSCULAR | Status: DC | PRN
  Administered 2018-10-23: 4 mg via INTRAVENOUS

## 2018-10-23 MED ORDER — DEXAMETHASONE SODIUM PHOSPHATE 10 MG/ML IJ SOLN
INTRAMUSCULAR | Status: DC | PRN
  Administered 2018-10-23: 5 mg via INTRAVENOUS

## 2018-10-23 MED ORDER — PROPOFOL 500 MG/50ML IV EMUL
INTRAVENOUS | Status: AC
  Filled 2018-10-23: qty 50

## 2018-10-23 MED ORDER — MIDAZOLAM HCL 2 MG/2ML IJ SOLN
INTRAMUSCULAR | Status: DC | PRN
  Administered 2018-10-23: 1 mg via INTRAVENOUS

## 2018-10-23 MED ORDER — OXYCODONE HCL 5 MG/5ML PO SOLN
5.0000 mg | Freq: Once | ORAL | Status: DC | PRN
  Filled 2018-10-23: qty 5

## 2018-10-23 MED ORDER — LACTATED RINGERS IV SOLN
INTRAVENOUS | Status: DC | PRN
  Administered 2018-10-23: 09:00:00 via INTRAVENOUS

## 2018-10-23 MED ORDER — OXYCODONE HCL 5 MG PO TABS
5.0000 mg | ORAL_TABLET | Freq: Once | ORAL | Status: DC | PRN
  Filled 2018-10-23: qty 1

## 2018-10-23 MED ORDER — ONDANSETRON HCL 4 MG/2ML IJ SOLN
4.0000 mg | Freq: Once | INTRAMUSCULAR | Status: DC | PRN
  Filled 2018-10-23: qty 2

## 2018-10-23 SURGICAL SUPPLY — 19 items
CATH ROBINSON RED A/P 16FR (CATHETERS) ×5 IMPLANT
CLOTH BEACON ORANGE TIMEOUT ST (SAFETY) ×3 IMPLANT
CONTAINER PREFILL 10% NBF 60ML (FORM) ×6 IMPLANT
COVER WAND RF STERILE (DRAPES) ×3 IMPLANT
DILATOR CANAL MILEX (MISCELLANEOUS) ×2 IMPLANT
GLOVE BIO SURGEON STRL SZ 6.5 (GLOVE) ×2 IMPLANT
GLOVE BIO SURGEONS STRL SZ 6.5 (GLOVE) ×1
GLOVE BIOGEL PI IND STRL 7.0 (GLOVE) ×1 IMPLANT
GLOVE BIOGEL PI INDICATOR 7.0 (GLOVE) ×2
GOWN STRL REUS W/TWL LRG LVL3 (GOWN DISPOSABLE) ×6 IMPLANT
IV NS IRRIG 3000ML ARTHROMATIC (IV SOLUTION) ×2 IMPLANT
KIT PROCEDURE FLUENT (KITS) ×2 IMPLANT
MIRENA  LEVONORGESTREL -RELEASING INTRATERINE SYST ×2 IMPLANT
NDL SPNL 22GX3.5 QUINCKE BK (NEEDLE) ×1 IMPLANT
NEEDLE SPNL 22GX3.5 QUINCKE BK (NEEDLE) ×3 IMPLANT
PACK VAGINAL MINOR WOMEN LF (CUSTOM PROCEDURE TRAY) ×3 IMPLANT
PAD PREP 24X48 CUFFED NSTRL (MISCELLANEOUS) ×3 IMPLANT
SYR 30ML LL (SYRINGE) ×2 IMPLANT
TOWEL OR 17X26 10 PK STRL BLUE (TOWEL DISPOSABLE) ×6 IMPLANT

## 2018-10-23 NOTE — Discharge Instructions (Signed)
Post Anesthesia Home Care Instructions  Activity: Get plenty of rest for the remainder of the day. A responsible individual must stay with you for 24 hours following the procedure.  For the next 24 hours, DO NOT: -Drive a car -Advertising copywriter -Drink alcoholic beverages -Take any medication unless instructed by your physician -Make any legal decisions or sign important papers.  Meals: Start with liquid foods such as gelatin or soup. Progress to regular foods as tolerated. Avoid greasy, spicy, heavy foods. If nausea and/or vomiting occur, drink only clear liquids until the nausea and/or vomiting subsides. Call your physician if vomiting continues.  Special Instructions/Symptoms: Your throat may feel dry or sore from the anesthesia or the breathing tube placed in your throat during surgery. If this causes discomfort, gargle with warm salt water. The discomfort should disappear within 24 hours.  Intrauterine Device Insertion What happens after the procedure?  You may have bleeding after the procedure. This is normal. It varies from light bleeding (spotting) for a few days to menstrual-like bleeding.  You may have cramping and pain.  You may feel dizzy or light-headed.  You may have lower back pain. Summary  An intrauterine device (IUD) is a small, T-shaped device that has one or two nylon strings hanging down from it.  Two types of IUDs are available. You may have a copper IUD or a hormone IUD.  Schedule the IUD insertion for when you will have your menstrual period or right after, to make sure you are not pregnant. Placement of the IUD is better tolerated shortly after a menstrual cycle.  You may have bleeding after the procedure. This is normal. It varies from light spotting for a few days to menstrual-like bleeding. This information is not intended to replace advice given to you by your health care provider. Make sure you discuss any questions you have with your health care  provider. Document Released: 04/11/2011 Document Revised: 07/04/2016 Document Reviewed: 07/04/2016 Elsevier Interactive Patient Education  2019 [employer].

## 2018-10-23 NOTE — Interval H&P Note (Signed)
History and Physical Interval Note:  10/23/2018 8:50 AM  Nancy [name]  has presented today for surgery, with the diagnosis of handicapp, needs pap and IUD replaced  The various methods of treatment have been discussed with the patient and family. After consideration of risks, benefits and other options for treatment, the patient has consented to  Procedure(s) with comments: EXAM UNDER ANESTHESIA (N/A) - pap INTRAUTERINE DEVICE (IUD) INSERTION; replaced (N/A) - Mirena replaced as a surgical intervention .  The patient's history has been reviewed, patient examined, no change in status, stable for surgery.  I have reviewed the patient's chart and labs.  Questions were answered to the patient's satisfaction.     Lyell Clugston Bovard-Stuckert

## 2018-10-23 NOTE — Anesthesia Preprocedure Evaluation (Addendum)
Anesthesia Evaluation  Patient identified by MRN, date of birth, ID band Patient awake    Reviewed: Allergy & Precautions, NPO status , Patient's Chart, lab work & pertinent test results  History of Anesthesia Complications Negative for: history of anesthetic complications  Airway Mallampati: III  TM Distance: >3 FB Neck ROM: Full    Dental  (+) Partial Upper, Dental Advisory Given   Pulmonary neg pulmonary ROS,    breath sounds clear to auscultation       Cardiovascular + DVT (no longer on anticoagulation)   Rhythm:Regular Rate:Normal     Neuro/Psych Seizures -, Well Controlled,  PSYCHIATRIC DISORDERS  Mood d/o  Cerebral palsy Wheelchair bound Mental retardation     GI/Hepatic negative GI ROS, Neg liver ROS,   Endo/Other  negative endocrine ROS  Renal/GU negative Renal ROS     Musculoskeletal  Scoliosis    Abdominal   Peds  Hematology  (+) anemia ,   Anesthesia Other Findings Pt answers questions appropriately. Pt's father and caregiver present to supplement any answers.  Reproductive/Obstetrics                          Anesthesia Physical Anesthesia Plan  ASA: II  Anesthesia Plan: MAC   Post-op Pain Management:    Induction: Intravenous  PONV Risk Score and Plan: 3 and Treatment may vary due to age or medical condition, Ondansetron and Propofol infusion  Airway Management Planned: Simple Face Mask and Natural Airway  Additional Equipment: None  Intra-op Plan:   Post-operative Plan:   Informed Consent: I have reviewed the patients History and Physical, chart, labs and discussed the procedure including the risks, benefits and alternatives for the proposed anesthesia with the patient or authorized representative who has indicated his/her understanding and acceptance.     Dental advisory given  Plan Discussed with: CRNA and Anesthesiologist  Anesthesia Plan Comments:         Anesthesia Quick Evaluation

## 2018-10-23 NOTE — Anesthesia Postprocedure Evaluation (Signed)
Anesthesia Post Note  Patient: [name]  Procedure(s) Performed: EXAM UNDER ANESTHESIA WITH ULTRASOUND, HYSTEROSCOPY (N/A Vagina ) INTRAUTERINE DEVICE (IUD) REPLACEMENT (N/A Vagina )     Patient location during evaluation: PACU Anesthesia Type: MAC Level of consciousness: awake and alert Pain management: pain level controlled Vital Signs Assessment: post-procedure vital signs reviewed and stable Respiratory status: spontaneous breathing, nonlabored ventilation and respiratory function stable Cardiovascular status: stable and blood pressure returned to baseline Anesthetic complications: no    Last Vitals:  Vitals:   10/23/18 1045 10/23/18 1113  BP: 124/84 111/87  Pulse: 84 88  Resp: 15 14  Temp:  36.7 C  SpO2: 100% 100%    Last Pain:  Vitals:   10/23/18 0729  TempSrc: Oral                 Audry Pili

## 2018-10-23 NOTE — Brief Op Note (Signed)
10/23/2018  10:31 AM  PATIENT:  Nancy [name]  28 y.o. female  PRE-OPERATIVE DIAGNOSIS:  handicapp, needs pap and IUD replaced  POST-OPERATIVE DIAGNOSIS:  handicapp, needs pap and IUD replaced  PROCEDURE:  Procedure(s): EXAM UNDER ANESTHESIA WITH ULTRASOUND, HYSTEROSCOPY (N/A) INTRAUTERINE DEVICE (IUD) REPLACEMENT (N/A)  SURGEON:  Surgeon(s) and Role:    * Bovard-Stuckert, Sriman Tally, MD - Primary  ANESTHESIA:   MAC  EBL:  5 mL uop 200cc by I&O cath IVF per anesthesia  BLOOD ADMINISTERED:none  DRAINS: none   LOCAL MEDICATIONS USED:  LIDOCAINE   SPECIMEN:  No Specimen  DISPOSITION OF SPECIMEN:  N/A  COUNTS:  YES  TOURNIQUET:  * No tourniquets in log *  DICTATION: .Other Dictation: Dictation Number [id number]  PLAN OF CARE: Discharge to home after PACU  PATIENT DISPOSITION:  PACU - hemodynamically stable.   Delay start of Pharmacological VTE agent (>24hrs) due to surgical blood loss or risk of bleeding: not applicable

## 2018-10-23 NOTE — Op Note (Signed)
NAME: Nancy [name], Nancy [name] MEDICAL RECORD TZ:[id number] ACCOUNT [id number] DATE OF BIRTH:01-29-1991 FACILITY: [facility] LOCATION: [facility] PHYSICIAN:Airiana Elman BOVARD-STUCKERT, MD  OPERATIVE REPORT  DATE OF PROCEDURE:  10/23/2018  PREOPERATIVE DIAGNOSIS:  Spastic quadriparesis, needs Pap, desires intrauterine device replacement.  POSTOPERATIVE DIAGNOSIS:  Spastic quadriparesis, needs Pap, desires intrauterine device replacement, status post ultrasound guided attempted intrauterine device replacement as well as hysteroscope.  PROCEDURE:  Exam under anesthesia with ultrasound and hysteroscopy with IUD removal and replacement.  SURGEON:  Janyth Contes, MD  ANESTHESIA:  MAC.  ESTIMATED BLOOD LOSS:  5 mL  URINE OUTPUT:  200 mL by I and O catheterization.  IV FLUIDS:  Per anesthesia.  COMPLICATIONS:  Difficulty with removal of IUD and IUD localization solved with ultrasound guidance and a hysteroscope.  HYSTEROSCOPIC DEFICIT:  Approximately 85 mL  PATHOLOGY:  None.  DESCRIPTION OF PROCEDURE:  After informed consent was reviewed with the patient, her caregivers and her power of attorney, she was transported to the OR, placed on the table in supine position.  MAC was then reduced and found to be adequate.  She was  then placed gently in the [place]in stirrups.  A small Terance Hart was used to visualize her cervix.  A Pap smear was performed.  Her IUD strings were unable to be located.  Her cervix was prepped with Betadine and a string finder was used as well as a Kelly  to attempt to locate the IUD.  This was unsuccessful.  Ultrasound guidance was used.  The ultrasound tech came to the room and transabdominally had a difficult time locating the IUD due to the air in the uterine cavity.  The decision was made to proceed  with a hysteroscope after IUD placement was confirmed.  The hysteroscope was primed and introduced into the uterus as the cavity was dilated with the fluid.  The IUD was noted,  grasped with the graspers and removed in entirety.  The uterus was sounded  to approximately 8 cm and the Mirena IUD was placed.  The strings were trimmed to approximately 2 cm.  The instruments were removed from the patient's vagina.  She tolerated the procedure well.  Sponge, lap and needle count was correct x2 per the  operating staff.  TN/NUANCE  D:10/23/2018 T:10/23/2018 JOB:[id number]

## 2018-10-23 NOTE — Interval H&P Note (Signed)
History and Physical Interval Note:  10/23/2018 8:50 AM  Nancy [name]  has presented today for surgery, with the diagnosis of handicapp, needs pap and IUD replaced  The various methods of treatment have been discussed with the patient and family. After consideration of risks, benefits and other options for treatment, the patient has consented to  Procedure(s) with comments: EXAM UNDER ANESTHESIA (N/A) - pap INTRAUTERINE DEVICE (IUD) INSERTION; replaced (N/A) - Mirena replaced as a surgical intervention .  The patient's history has been reviewed, patient examined, no change in status, stable for surgery.  I have reviewed the patient's chart and labs.  Questions were answered to the patient's satisfaction.     Nancy [name]   

## 2018-10-23 NOTE — Anesthesia Procedure Notes (Signed)
Procedure Name: [place] Performed by: Wanita Chamberlain, CRNA Pre-anesthesia Checklist: Patient identified, Emergency Drugs available, Suction available and Patient being monitored Patient Re-evaluated:Patient Re-evaluated prior to induction Oxygen Delivery Method: Nasal cannula Preoxygenation: Pre-oxygenation with 100% oxygen Induction Type: IV induction Placement Confirmation: CO2 detector,  breath sounds checked- equal and bilateral and positive ETCO2 Dental Injury: Teeth and Oropharynx as per pre-operative assessment

## 2018-10-23 NOTE — Transfer of Care (Signed)
Immediate Anesthesia Transfer of Care Note  Patient: Nancy [name]  Procedure(s) Performed: EXAM UNDER ANESTHESIA WITH ULTRASOUND, HYSTEROSCOPY (N/A Vagina ) INTRAUTERINE DEVICE (IUD) REPLACEMENT (N/A Vagina )  Patient Location: PACU  Anesthesia Type:MAC  Level of Consciousness: awake, alert , oriented and patient cooperative  Airway & Oxygen Therapy: Patient Spontanous Breathing and Patient connected to nasal cannula oxygen  Post-op Assessment: Report given to RN and Post -op Vital signs reviewed and stable  Post vital signs: Reviewed and stable  Last Vitals:  Vitals Value Taken Time  BP    Temp    Pulse    Resp    SpO2      Last Pain:  Vitals:   10/23/18 0729  TempSrc: Oral         Complications: No apparent anesthesia complications

## 2018-10-24 ENCOUNTER — Encounter (HOSPITAL_BASED_OUTPATIENT_CLINIC_OR_DEPARTMENT_OTHER): Payer: Self-pay | Admitting: Obstetrics and Gynecology

## 2018-10-24 LAB — CYTOLOGY - PAP
Adequacy: ABSENT
Diagnosis: NEGATIVE

## 2018-10-28 ENCOUNTER — Encounter: Payer: Medicare Other | Attending: Physical Medicine & Rehabilitation | Admitting: Physical Medicine & Rehabilitation

## 2018-10-28 DIAGNOSIS — Z833 Family history of diabetes mellitus: Secondary | ICD-10-CM | POA: Insufficient documentation

## 2018-10-28 DIAGNOSIS — F329 Major depressive disorder, single episode, unspecified: Secondary | ICD-10-CM | POA: Insufficient documentation

## 2018-10-28 DIAGNOSIS — G800 Spastic quadriplegic cerebral palsy: Secondary | ICD-10-CM | POA: Insufficient documentation

## 2018-10-28 DIAGNOSIS — F419 Anxiety disorder, unspecified: Secondary | ICD-10-CM | POA: Insufficient documentation

## 2018-10-28 DIAGNOSIS — M4134 Thoracogenic scoliosis, thoracic region: Secondary | ICD-10-CM | POA: Insufficient documentation

## 2018-10-28 DIAGNOSIS — R569 Unspecified convulsions: Secondary | ICD-10-CM | POA: Insufficient documentation

## 2018-10-28 DIAGNOSIS — Z79899 Other long term (current) drug therapy: Secondary | ICD-10-CM | POA: Insufficient documentation

## 2018-11-06 ENCOUNTER — Other Ambulatory Visit: Payer: Self-pay | Admitting: Family Medicine

## 2018-11-15 ENCOUNTER — Other Ambulatory Visit: Payer: Self-pay | Admitting: Family Medicine

## 2019-01-01 ENCOUNTER — Other Ambulatory Visit: Payer: Self-pay | Admitting: Family Medicine

## 2019-01-27 DIAGNOSIS — Z20828 Contact with and (suspected) exposure to other viral communicable diseases: Secondary | ICD-10-CM | POA: Diagnosis not present

## 2019-01-27 DIAGNOSIS — U071 COVID-19: Secondary | ICD-10-CM | POA: Diagnosis not present

## 2019-02-09 ENCOUNTER — Other Ambulatory Visit: Payer: Self-pay | Admitting: Family Medicine

## 2019-02-09 NOTE — Telephone Encounter (Signed)
Ok to refill??  Last office visit 05/26/2018.  Last refill 10/10/2018, #2 refills.

## 2019-02-20 ENCOUNTER — Other Ambulatory Visit: Payer: Self-pay | Admitting: Family Medicine

## 2019-03-16 DIAGNOSIS — G809 Cerebral palsy, unspecified: Secondary | ICD-10-CM | POA: Diagnosis not present

## 2019-03-16 DIAGNOSIS — H5203 Hypermetropia, bilateral: Secondary | ICD-10-CM | POA: Diagnosis not present

## 2019-03-16 DIAGNOSIS — H5032 Intermittent alternating esotropia: Secondary | ICD-10-CM | POA: Diagnosis not present

## 2019-03-23 ENCOUNTER — Other Ambulatory Visit: Payer: Self-pay | Admitting: Family Medicine

## 2019-03-23 NOTE — Therapy (Signed)
[facility] [address] [place], [place], [address] Phone: [phone]   Fax:  [phone]  Patient Details  Name: Nancy [name] MRN: [id number] Date of Birth: [birth year] Referring Provider:  No ref. provider found  Encounter Date: 03/23/2019  Pt did not return after 2nd visit on 07/30/18, therefore will d/c episode of care at this time.   Carey Bullocks, OTR/L 03/23/2019, 1:49 PM  [facility] [address] [place], [place], [address] Phone: [phone]   Fax:  [phone]

## 2019-03-23 NOTE — Telephone Encounter (Signed)
Ok to refill??  Last office visit 05/26/2018.  Last refill 02/09/2019, #1 refill.

## 2019-03-27 ENCOUNTER — Telehealth: Payer: Self-pay

## 2019-03-27 NOTE — Telephone Encounter (Signed)
Faxed form to [employer] with [facility].

## 2019-04-08 ENCOUNTER — Other Ambulatory Visit: Payer: Self-pay

## 2019-04-08 MED ORDER — ARIPIPRAZOLE 5 MG PO TABS: 5.0000 mg | ORAL_TABLET | Freq: Every day | ORAL | 0 refills | Status: DC

## 2019-04-29 ENCOUNTER — Other Ambulatory Visit: Payer: Self-pay | Admitting: Family Medicine

## 2019-04-30 NOTE — Telephone Encounter (Signed)
Requested Prescriptions   Pending Prescriptions Disp Refills  . clonazePAM (KLONOPIN) 1 MG tablet [Pharmacy Med Name: CLONAZEPAM 1MG  TAB] 30 tablet     Sig: TAKE ONE TABLET BY MOUTH EVERY MORNING    Last OV 05/26/2018  Last written 03/24/2019

## 2019-05-19 ENCOUNTER — Ambulatory Visit (INDEPENDENT_AMBULATORY_CARE_PROVIDER_SITE_OTHER): Payer: Medicare Other | Admitting: Family Medicine

## 2019-05-19 ENCOUNTER — Encounter: Payer: Self-pay | Admitting: Family Medicine

## 2019-05-19 VITALS — BP 126/64 | HR 96 | Temp 98.7°F | Resp 18

## 2019-05-19 DIAGNOSIS — F419 Anxiety disorder, unspecified: Secondary | ICD-10-CM

## 2019-05-19 DIAGNOSIS — G250 Essential tremor: Secondary | ICD-10-CM | POA: Diagnosis not present

## 2019-05-19 DIAGNOSIS — L219 Seborrheic dermatitis, unspecified: Secondary | ICD-10-CM | POA: Diagnosis not present

## 2019-05-19 DIAGNOSIS — Z23 Encounter for immunization: Secondary | ICD-10-CM | POA: Diagnosis not present

## 2019-05-19 MED ORDER — DESONIDE 0.05 % EX CREA: TOPICAL_CREAM | Freq: Two times a day (BID) | CUTANEOUS | 0 refills | Status: DC

## 2019-05-19 MED ORDER — CLONAZEPAM 1 MG PO TABS: 1.0000 mg | ORAL_TABLET | Freq: Two times a day (BID) | ORAL | 1 refills | Status: DC | PRN

## 2019-05-19 NOTE — Progress Notes (Signed)
Subjective:    Patient ID: Nancy [name], female    DOB: [birth year], 28 y.o.   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  Patient is here today with her caregiver.  She is concerned that she is having seizures.  When I asked the patient to describe her seizures, she states that when she picks up a spoon, her hand will start to shake.  The more she tries to hold her hand still the more her hand will shake.  If she puts down the spoon her hand will stop shaking.  The tremor seems to worsen with focus and attention and improved with relaxation.  She denies any bowel or bladder incontinence.  She denies any loss of consciousness.  She denies any paresthesias or numbness or tingling in either any of her extremities.  She denies any staring spells".  She denies any post ictal sleepiness.  She denies any headaches or recent head injury.  Her neurologic exam is at her baseline today with spasticity and hyperreflexia in the upper and lower extremities but cranial nerves II through XII are grossly intact.  She also reports trouble sleeping at night.  She states is hard for her to focus because her mind races and wanders with anxious thoughts.  She has a contentious relationship with her father.  At the present time there are legal proceedings with her aunt trying to get joint custody.  Often when she talks to her father he upsets her.  She denies depression Past Medical History:  Diagnosis Date  . Anemia    h/o iron deficiency  . CP (cerebral palsy) (HCC)    with mild contracture  . IUD (intrauterine device) in place 10/2012  . Menorrhagia with regular cycle 11/11/2012  . Mobility impaired 11/11/2012  . Mood disorder (HCC)   . MR (mental retardation)   . Peripheral vascular disease (HCC) 10/02/2006   bilateral DVT lower legs  .  Seizures (HCC)    childhood  . Wheelchair bound    r/t CP   Past Surgical History:  Procedure Laterality Date  . EYE SURGERY     as a child  . HIP SURGERY Right 2003  . INTRAUTERINE DEVICE (IUD) INSERTION N/A 11/12/2012   Procedure: INTRAUTERINE DEVICE (IUD) INSERTION;  Surgeon: Sherron Monday, MD;  Location: [facility];  Service: Gynecology;  Laterality: N/A;  . INTRAUTERINE DEVICE (IUD) INSERTION N/A 10/23/2018   Procedure: INTRAUTERINE DEVICE (IUD) REPLACEMENT;  Surgeon: Sherian Rein, MD;  Location: [facility];  Service: Gynecology;  Laterality: N/A;  . LEG SURGERY     as a child  . MULTIPLE TOOTH EXTRACTIONS  10/18/10   Current Outpatient Medications on File Prior to Visit  Medication Sig Dispense Refill  . ARIPiprazole (ABILIFY) 5 MG tablet Take 1 tablet (5 mg total) by mouth daily. 30 tablet 0  . baclofen (LIORESAL) 20 MG tablet Take 1 tablet (20 mg total) by mouth 3 (three) times daily. 180 tablet 4  . clonazePAM (KLONOPIN) 1 MG tablet TAKE ONE TABLET BY MOUTH EVERY MORNING 30 tablet 0  . ibuprofen (ADVIL,MOTRIN) 600 MG tablet Take 1 tablet (600 mg total) by mouth every 6 (six) hours as needed for moderate pain. 30 tablet 1  . ketoconazole (NIZORAL) 2 % shampoo APPLY ONE APPLICATION TOPICALLY THREE TIMES WEEKLY 120 mL 3  .  levonorgestrel (MIRENA) 20 MCG/24HR IUD 1 each by Intrauterine route once.     No current facility-administered medications on file prior to visit.    No Known Allergies Social History   Socioeconomic History  . Marital status: Single    Spouse name: Not on file  . Number of children: Not on file  . Years of education: Not on file  . Highest education level: Not on file  Occupational History  . Not on file  Social Needs  . Financial resource strain: Not on file  . Food insecurity    Worry: Not on file    Inability: Not on file  . Transportation needs    Medical: Not on file    Non-medical: Not on file  Tobacco Use  . Smoking  status: Never Smoker  . Smokeless tobacco: Never Used  Substance and Sexual Activity  . Alcohol use: No    Comment: Pt denies  . Drug use: No    Comment: Pt denies  . Sexual activity: Never    Comment: lives in a group home.  single.  Lifestyle  . Physical activity    Days per week: Not on file    Minutes per session: Not on file  . Stress: Not on file  Relationships  . Social Musician on phone: Not on file    Gets together: Not on file    Attends religious service: Not on file    Active member of club or organization: Not on file    Attends meetings of clubs or organizations: Not on file    Relationship status: Not on file  . Intimate partner violence    Fear of current or ex partner: Not on file    Emotionally abused: Not on file    Physically abused: Not on file    Forced sexual activity: Not on file  Other Topics Concern  . Not on file  Social History Narrative   Lives in an apartment with [facility] (assisted living)   Left handed      Review of Systems  All other systems reviewed and are negative.      Objective:   Physical Exam  Constitutional: She is oriented to person, place, and time. She appears well-developed and well-nourished.  Cardiovascular: Normal rate, regular rhythm and normal heart sounds.  Exam reveals no gallop and no friction rub.   No murmur heard. Pulmonary/Chest: Effort normal and breath sounds normal. No respiratory distress. She has no wheezes. She has no rales.  Abdominal: Soft. Bowel sounds are normal.  Neurological: She is alert and oriented to person, place, and time. She exhibits abnormal muscle tone. Coordination abnormal.  Reflex Scores:      Tricep reflexes are 3+ on the right side and 3+ on the left side.      Bicep reflexes are 3+ on the right side and 3+ on the left side.      Brachioradialis reflexes are 3+ on the right side and 3+ on the left side. Vitals reviewed.     Patient has contractures in both upper  extremities at the elbow.  She sits in a fully flexed position with her arms drawn up to her chest.  Passively I have a difficult time extending her elbows.  Legs show significant muscle atrophy.  She is barely able to flex her hips on command.  There is contractures of both knees and also inversion of both ankles. Assessment & Plan:  Essential tremor  Seborrheic  dermatitis  Anxiety  Patient's history is consistent for an essential tremor/intention tremor.  I reassured the patient that I do not feel that this is seizure activity.  Therefore I do not feel that she needs an anti-epileptic medication at the present time.  Her exam is at her baseline.  The rash in the creases behind her ears and also in the nasolabial folds is consistent with seborrheic dermatitis.  I recommended that they use the ketoconazole shampoo twice weekly in these areas as well they can also apply DesOwen cream 1-2 times daily to this area as needed to help control the irritation.  I believe her insomnia is likely secondary to anxiety.  Therefore I recommended that she take the Klonopin at night prior to going to bed and reduce her dose in the morning to just a half a milligram or even not take the medication if avoidable.

## 2019-05-19 NOTE — Addendum Note (Signed)
Addended by: Shary Decamp B on: 05/19/2019 03:16 PM   Modules accepted: Orders

## 2019-05-21 ENCOUNTER — Other Ambulatory Visit: Payer: Self-pay | Admitting: Family Medicine

## 2019-06-03 ENCOUNTER — Telehealth: Payer: Self-pay | Admitting: Family Medicine

## 2019-06-03 NOTE — Telephone Encounter (Signed)
Submitted PA through [facility] for the Desonide cream - Received a suspended response. Will recheck in a few days.

## 2019-06-04 ENCOUNTER — Other Ambulatory Visit: Payer: Self-pay | Admitting: Family Medicine

## 2019-06-05 MED ORDER — HYDROCORTISONE 1 % EX CREA: 1.0000 "application " | TOPICAL_CREAM | Freq: Two times a day (BID) | CUTANEOUS | 1 refills | Status: DC

## 2019-06-05 NOTE — Telephone Encounter (Signed)
I would try otc cortisone cream instead

## 2019-06-05 NOTE — Telephone Encounter (Signed)
Sent hydrocortisone cream to pharm

## 2019-06-05 NOTE — Telephone Encounter (Signed)
Insurance denied Desonide Cream - other recommendations? (mcd will cover hydrocortisone cream,lotion or ointment, Derma-smoothe oil. Medium potency is Elocon and Cutivate)

## 2019-06-11 ENCOUNTER — Other Ambulatory Visit: Payer: Self-pay | Admitting: Physical Medicine & Rehabilitation

## 2019-06-11 DIAGNOSIS — G800 Spastic quadriplegic cerebral palsy: Secondary | ICD-10-CM

## 2019-06-26 ENCOUNTER — Other Ambulatory Visit: Payer: Self-pay | Admitting: Family Medicine

## 2019-06-26 NOTE — Telephone Encounter (Signed)
Ok to refill??  Last office visit/ refill 05/19/2019.

## 2019-08-06 ENCOUNTER — Other Ambulatory Visit: Payer: Self-pay | Admitting: Family Medicine

## 2019-08-13 ENCOUNTER — Other Ambulatory Visit: Payer: Self-pay | Admitting: Physical Medicine & Rehabilitation

## 2019-08-13 DIAGNOSIS — G800 Spastic quadriplegic cerebral palsy: Secondary | ICD-10-CM

## 2019-08-25 ENCOUNTER — Other Ambulatory Visit: Payer: Self-pay | Admitting: Physical Medicine & Rehabilitation

## 2019-08-25 DIAGNOSIS — G800 Spastic quadriplegic cerebral palsy: Secondary | ICD-10-CM

## 2019-08-29 DIAGNOSIS — Z20828 Contact with and (suspected) exposure to other viral communicable diseases: Secondary | ICD-10-CM | POA: Diagnosis not present

## 2019-08-29 DIAGNOSIS — Z9189 Other specified personal risk factors, not elsewhere classified: Secondary | ICD-10-CM | POA: Diagnosis not present

## 2019-09-02 ENCOUNTER — Other Ambulatory Visit: Payer: Self-pay | Admitting: Family Medicine

## 2019-09-02 NOTE — Telephone Encounter (Signed)
Ok to refill??  Last office visit 05/19/2019.  Last refill 06/26/2019.  Ok to add refills to prescription?

## 2019-09-07 DIAGNOSIS — Z20822 Contact with and (suspected) exposure to covid-19: Secondary | ICD-10-CM | POA: Diagnosis not present

## 2019-09-09 ENCOUNTER — Other Ambulatory Visit: Payer: Self-pay | Admitting: Physical Medicine & Rehabilitation

## 2019-09-09 DIAGNOSIS — G800 Spastic quadriplegic cerebral palsy: Secondary | ICD-10-CM

## 2019-09-14 ENCOUNTER — Other Ambulatory Visit: Payer: Self-pay | Admitting: Family Medicine

## 2019-10-03 DIAGNOSIS — Z20828 Contact with and (suspected) exposure to other viral communicable diseases: Secondary | ICD-10-CM | POA: Diagnosis not present

## 2019-10-06 ENCOUNTER — Other Ambulatory Visit: Payer: Self-pay | Admitting: Physical Medicine & Rehabilitation

## 2019-10-06 DIAGNOSIS — G800 Spastic quadriplegic cerebral palsy: Secondary | ICD-10-CM

## 2019-10-06 MED ORDER — BACLOFEN 20 MG PO TABS: 20.0000 mg | ORAL_TABLET | Freq: Three times a day (TID) | ORAL | 0 refills | Status: DC

## 2019-10-06 MED ORDER — ARIPIPRAZOLE 5 MG PO TABS: 5.0000 mg | ORAL_TABLET | Freq: Every day | ORAL | 2 refills | Status: DC

## 2019-10-06 NOTE — Telephone Encounter (Signed)
appt made for f/u

## 2019-10-28 ENCOUNTER — Other Ambulatory Visit: Payer: Self-pay | Admitting: Family Medicine

## 2019-10-28 ENCOUNTER — Encounter: Payer: Self-pay | Admitting: Physical Medicine & Rehabilitation

## 2019-10-28 ENCOUNTER — Other Ambulatory Visit: Payer: Self-pay

## 2019-10-28 ENCOUNTER — Encounter: Payer: Medicare Other | Attending: Physical Medicine & Rehabilitation | Admitting: Physical Medicine & Rehabilitation

## 2019-10-28 VITALS — BP 89/56 | HR 121 | Ht 61.0 in | Wt 100.0 lb

## 2019-10-28 DIAGNOSIS — G800 Spastic quadriplegic cerebral palsy: Secondary | ICD-10-CM | POA: Diagnosis not present

## 2019-10-28 NOTE — Progress Notes (Signed)
Subjective:    Patient ID: Nancy [name], female    DOB: Nov 09, 1990, 29 y.o.   MRN: [id number]  HPI   Nancy [name] is here in follow-up of her cerebral palsy and associated spasticity.  I last saw her in January 2020.  We had increased her baclofen at that point to 20 mg 3 times daily.  We had talked about doing Botox injections but due to Covid she has not been back to the office.  Since it has been a while her mom wanted to come back and discuss further treatment for spasticity.  She does feel that the baclofen has made a lot of difference with her tone.  She is tolerating the medication well.  I asked [name] if she felt that there were any specific problem areas that she still wanted to work on from a spasticity standpoint and she could not identify any for me.  Bowel bladder remain functional.  She has not had any problems with skin or any other medical complications.  Pain Inventory Average Pain 0 Pain Right Now 0 My pain is na  In the last 24 hours, has pain interfered with the following? General activity 0 Relation with others 0 Enjoyment of life 0 What TIME of day is your pain at its worst? na Sleep (in general) Good  Pain is worse with: na Pain improves with: na Relief from Meds: na  Mobility do you drive?  no use a wheelchair  Function employed # of hrs/week 16  Neuro/Psych trouble walking  Prior Studies Any changes since last visit?  no  Physicians involved in your care Any changes since last visit?  no   Family History  Problem Relation Age of Onset  . Cancer Mother   . Diabetes Father   . Alzheimer's disease Maternal Grandfather   . Heart attack Paternal Grandmother        Died between 45 or 84 years old   Social History   Socioeconomic History  . Marital status: Single    Spouse name: Not on file  . Number of children: Not on file  . Years of education: Not on file  . Highest education level: Not on file  Occupational History  . Not on  file  Tobacco Use  . Smoking status: Never Smoker  . Smokeless tobacco: Never Used  Substance and Sexual Activity  . Alcohol use: No    Comment: Pt denies  . Drug use: No    Comment: Pt denies  . Sexual activity: Never    Comment: lives in a group home.  single.  Other Topics Concern  . Not on file  Social History Narrative   Lives in an apartment with [facility] (assisted living)   Left handed    Social Determinants of Health   Financial Resource Strain:   . Difficulty of Paying Living Expenses: Not on file  Food Insecurity:   . Worried About Charity fundraiser in the Last Year: Not on file  . Ran Out of Food in the Last Year: Not on file  Transportation Needs:   . Lack of Transportation (Medical): Not on file  . Lack of Transportation (Non-Medical): Not on file  Physical Activity:   . Days of Exercise per Week: Not on file  . Minutes of Exercise per Session: Not on file  Stress:   . Feeling of Stress : Not on file  Social Connections:   . Frequency of Communication with Friends and Family: Not on file  .  Frequency of Social Gatherings with Friends and Family: Not on file  . Attends Religious Services: Not on file  . Active Member of Clubs or Organizations: Not on file  . Attends Banker Meetings: Not on file  . Marital Status: Not on file   Past Surgical History:  Procedure Laterality Date  . EYE SURGERY     as a child  . HIP SURGERY Right 2003  . INTRAUTERINE DEVICE (IUD) INSERTION N/A 11/12/2012   Procedure: INTRAUTERINE DEVICE (IUD) INSERTION;  Surgeon: Sherron Monday, MD;  Location: [facility];  Service: Gynecology;  Laterality: N/A;  . INTRAUTERINE DEVICE (IUD) INSERTION N/A 10/23/2018   Procedure: INTRAUTERINE DEVICE (IUD) REPLACEMENT;  Surgeon: Sherian Rein, MD;  Location: [facility];  Service: Gynecology;  Laterality: N/A;  . LEG SURGERY     as a child  . MULTIPLE TOOTH EXTRACTIONS  10/18/10   Past Medical History:  Diagnosis Date    . Anemia    h/o iron deficiency  . CP (cerebral palsy) (HCC)    with mild contracture  . IUD (intrauterine device) in place 10/2012  . Menorrhagia with regular cycle 11/11/2012  . Mobility impaired 11/11/2012  . Mood disorder (HCC)   . MR (mental retardation)   . Peripheral vascular disease (HCC) 10/02/2006   bilateral DVT lower legs  . Seizures (HCC)    childhood  . Wheelchair bound    r/t CP   BP (!) 89/56   Pulse (!) 121   Ht 5\' 1"  (1.549 m)   Wt 100 lb (45.4 kg)   SpO2 99%   BMI 18.89 kg/m   Opioid Risk Score:   Fall Risk Score:  `1  Depression screen PHQ 2/9  Depression screen [facility] 2/9 09/24/2018 08/26/2018 11/28/2016  Decreased Interest 1 1 0  Down, Depressed, Hopeless 1 1 0  PHQ - 2 Score 2 2 0  Altered sleeping - 1 0  Tired, decreased energy - 2 0  Change in appetite - 0 0  Feeling bad or failure about yourself  - 1 0  Trouble concentrating - 3 0  Moving slowly or fidgety/restless - 1 0  Suicidal thoughts - 0 0  PHQ-9 Score - 10 0  Difficult doing work/chores - - Not difficult at all     Review of Systems  Constitutional: Negative.   HENT: Negative.   Eyes: Negative.   Respiratory: Negative.   Cardiovascular: Negative.   Gastrointestinal: Negative.   Endocrine: Negative.   Genitourinary: Negative.   Musculoskeletal: Positive for gait problem and myalgias.  Skin: Negative.   Allergic/Immunologic: Negative.   Hematological: Negative.   Psychiatric/Behavioral: Negative.   All other systems reviewed and are negative.      Objective:   Physical Exam General: No acute distress HEENT: EOMI, oral membranes moist Cards: reg rate  Chest: normal effort Abdomen: Soft, NT, ND Skin: dry, intact Extremities: no edema  Neuro:alert and orientd x3. Dysarthric.  She has 3 to 3+ out of 5 strength in the upper extremities, left may be 4/5. . right shoulder without tone. Biceps 2/4. Tr/4 right wrist.  .  Hip adductors 1-2/4 and hamstrings are 3/4 bilaterally.  gastrocs 1-2/4.  l Reflexes are 3+ throughout.  Musculoskeletal:She has significant dextroscoliosis of the thoracolumbar spine. fair sitting posture in w/c.  Psych:very pleasant      Assessment & Plan:  1. Cerebral palsy with spastic tetraplegia 2. Severe dextroscoliosis of the thoracolumbar spine 3. History of seizures 4. History of psychosis/mood  disorder which appears to be well compensated at present   Plan: 1.  Continue baclofen 20 mg 3 times daily 2. Home exercise program as tolerated3. Will arrange for botox 400 to hamstrings (200u each) 4. Briefly reviewed baclofen pump again which could be an option here given the widespread nature of her spasticity.   Fifteen minutes of face to face patient care time were spent during this visit. All questions were encouraged and answered.  Follow up with me in about a month.

## 2019-10-28 NOTE — Patient Instructions (Signed)
PLEASE FEEL FREE TO CALL OUR OFFICE WITH ANY PROBLEMS OR QUESTIONS ([phone])      

## 2019-11-23 ENCOUNTER — Other Ambulatory Visit: Payer: Self-pay | Admitting: Family Medicine

## 2019-11-23 NOTE — Telephone Encounter (Signed)
Ok to refill??  Last office visit 05/19/2019.  Last refill 04/30/2019.

## 2019-11-25 ENCOUNTER — Other Ambulatory Visit: Payer: Self-pay | Admitting: Physical Medicine & Rehabilitation

## 2019-11-25 ENCOUNTER — Other Ambulatory Visit: Payer: Self-pay | Admitting: Family Medicine

## 2019-11-25 DIAGNOSIS — G800 Spastic quadriplegic cerebral palsy: Secondary | ICD-10-CM

## 2019-12-16 ENCOUNTER — Encounter: Payer: Medicare Other | Admitting: Physical Medicine & Rehabilitation

## 2020-01-06 ENCOUNTER — Other Ambulatory Visit: Payer: Self-pay

## 2020-01-06 ENCOUNTER — Encounter: Payer: Self-pay | Admitting: Physical Medicine & Rehabilitation

## 2020-01-06 ENCOUNTER — Encounter: Payer: Medicare Other | Attending: Physical Medicine & Rehabilitation | Admitting: Physical Medicine & Rehabilitation

## 2020-01-06 VITALS — BP 135/90 | HR 105 | Temp 98.3°F

## 2020-01-06 DIAGNOSIS — G800 Spastic quadriplegic cerebral palsy: Secondary | ICD-10-CM | POA: Insufficient documentation

## 2020-01-06 NOTE — Progress Notes (Signed)
Botox Injection for spasticity using needle EMG guidance Indication: CP (cerebral palsy), spastic, quadriplegic (HCC) G80.0   Dilution: 100 Units/ml        Total Units Injected: 400 Indication: Severe spasticity which interferes with ADL,mobility and/or  hygiene and is unresponsive to medication management and other conservative care Informed consent was obtained after describing risks and benefits of the procedure with the patient. This includes bleeding, bruising, infection, excessive weakness, or medication side effects. A REMS form is on file and signed.  Needle: 10mm injectable monopolar needle electrode   bilateral Number of units per muscle  Quadriceps 0 units Gastroc/soleus 0 units Hamstrings 300 units left hamstrings 3 access points, 100 units right hams 3 access points Tibialis Posterior 0 units Tibialis Anterior 0 units EHL 0 units All injections were done after obtaining appropriate EMG activity and after negative drawback for blood. The patient tolerated the procedure well. Post procedure instructions were given. Return in about 3 months (around 04/07/2020).

## 2020-01-06 NOTE — Patient Instructions (Signed)
PLEASE FEEL FREE TO CALL OUR OFFICE WITH ANY PROBLEMS OR QUESTIONS ([phone])      

## 2020-01-18 ENCOUNTER — Other Ambulatory Visit: Payer: Self-pay

## 2020-01-18 ENCOUNTER — Other Ambulatory Visit: Payer: Self-pay | Admitting: Family Medicine

## 2020-01-18 ENCOUNTER — Ambulatory Visit (INDEPENDENT_AMBULATORY_CARE_PROVIDER_SITE_OTHER): Payer: Medicare Other | Admitting: Family Medicine

## 2020-01-18 VITALS — BP 126/80 | HR 130 | Temp 100.3°F

## 2020-01-18 DIAGNOSIS — G800 Spastic quadriplegic cerebral palsy: Secondary | ICD-10-CM

## 2020-01-18 NOTE — Progress Notes (Signed)
Subjective:    Patient ID: Nancy [name], female    DOB: 09-Apr-1991, 29 y.o.   MRN: [id number]  HPI  Patient is a 29 year old African-American female who is being seen today for a face-to-face evaluation for power wheelchair. 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 She needs a replacement for her current wheelchair as she has had her current wheelchair for over 5 years.   It is in need of replacement due to wear and tear from normal use.  Several pieces are in disrepair on the wheelchair. The head rest falls off for no reason.  The straps on the legs no longer work which are necessary due to her spasticity in her legs.  The wheelchair no longer meets her positioning needs. She's been evaluated by physical therapist.  They have previously determined that the patient has poor static and dynamic sitting balance, decreased rate, decreased range of motion and requires maximum assistance for all ADLs.   The patient is not able to functionally ambulate independently or with assistance due to her spastic quadriplegia. She does not have sufficient strength in her lower extremities to even bear weight on her legs. She also does not have sufficient strength in her upper extremities to operate a manual wheelchair.  She is unable to fully extend her arms at the elbows.  Her muscle strength is at best 3 over 5 in the upper extremities against resistance.  I do not believe that with the contractures of her hands in her upper extremities she would be able to operate a manual wheelchair. Patient is unable to walk at all. Therefore she will rely on power mobility for all her functional mobility.   she also has schizoaffective disorder with bipolar tendencies with paranoia and psychosis. This is currently being well managed with Abilify.   There is no evidence of depression or anxiety at the present time. She denies any hallucinations or delusions. There are no violent outburst. She  is overdue for fasting lab work. Her flu shot is up-to-date. Past Medical History:  Diagnosis Date  . Anemia    h/o iron deficiency  . CP (cerebral palsy) (HCC)    with mild contracture  . IUD (intrauterine device) in place 10/2012  . Menorrhagia with regular cycle 11/11/2012  . Mobility impaired 11/11/2012  . Mood disorder (HCC)   . MR (mental retardation)   . Peripheral vascular disease (HCC) 10/02/2006   bilateral DVT lower legs  . Seizures (HCC)    childhood  . Wheelchair bound    r/t CP   Past Surgical History:  Procedure Laterality Date  . EYE SURGERY     as a child  . HIP SURGERY Right 2003  . INTRAUTERINE DEVICE (IUD) INSERTION N/A 11/12/2012   Procedure: INTRAUTERINE DEVICE (IUD) INSERTION;  Surgeon: Sherron Monday, MD;  Location: [facility];  Service: Gynecology;  Laterality: N/A;  . INTRAUTERINE DEVICE (IUD) INSERTION N/A 10/23/2018   Procedure: INTRAUTERINE DEVICE (IUD) REPLACEMENT;  Surgeon: Sherian Rein, MD;  Location: [facility];  Service: Gynecology;  Laterality: N/A;  . LEG SURGERY     as a child  . MULTIPLE TOOTH EXTRACTIONS  10/18/10   Current Outpatient Medications on File Prior to Visit  Medication Sig Dispense Refill  . desonide (DESOWEN) 0.05 % cream Apply topically 2 (two) times daily. 30 g 0  . ARIPiprazole (ABILIFY) 5 MG tablet TAKE ONE TABLET BY MOUTH DAILY 30 tablet 11  . baclofen (LIORESAL) 20 MG tablet TAKE  ONE TABLET BY MOUTH THREE TIMES A DAY 90 tablet 1  . clonazePAM (KLONOPIN) 1 MG tablet TAKE ONE TABLET BY MOUTH TWICE A DAY AS NEEDED FOR ANXIETY 60 tablet 1  . ketoconazole (NIZORAL) 2 % shampoo APPLY ONE APPLICATION TOPICALLY THREE TIMES WEEKLY 120 mL 2  . levonorgestrel (MIRENA) 20 MCG/24HR IUD 1 each by Intrauterine route once.     No current facility-administered medications on file prior to visit.   No Known Allergies Social History   Socioeconomic History  . Marital status: Single    Spouse name: Not on file  . Number  of children: Not on file  . Years of education: Not on file  . Highest education level: Not on file  Occupational History  . Not on file  Tobacco Use  . Smoking status: Never Smoker  . Smokeless tobacco: Never Used  Substance and Sexual Activity  . Alcohol use: No    Comment: Pt denies  . Drug use: No    Comment: Pt denies  . Sexual activity: Never    Comment: lives in a group home.  single.  Other Topics Concern  . Not on file  Social History Narrative   Lives in an apartment with [facility] (assisted living)   Left handed    Social Determinants of Health   Financial Resource Strain:   . Difficulty of Paying Living Expenses:   Food Insecurity:   . Worried About Charity fundraiser in the Last Year:   . Arboriculturist in the Last Year:   Transportation Needs:   . Film/video editor (Medical):   Marland Kitchen Lack of Transportation (Non-Medical):   Physical Activity:   . Days of Exercise per Week:   . Minutes of Exercise per Session:   Stress:   . Feeling of Stress :   Social Connections:   . Frequency of Communication with Friends and Family:   . Frequency of Social Gatherings with Friends and Family:   . Attends Religious Services:   . Active Member of Clubs or Organizations:   . Attends Archivist Meetings:   Marland Kitchen Marital Status:   Intimate Partner Violence:   . Fear of Current or Ex-Partner:   . Emotionally Abused:   Marland Kitchen Physically Abused:   . Sexually Abused:      Review of Systems  All other systems reviewed and are negative.      Objective:   Physical Exam  Constitutional: She is oriented to person, place, and time. She appears well-developed and well-nourished.  Cardiovascular: Normal rate, regular rhythm and normal heart sounds.  Exam reveals no gallop and no friction rub.   No murmur heard. Pulmonary/Chest: Effort normal and breath sounds normal. No respiratory distress. She has no wheezes. She has no rales.  Abdominal: Soft. Bowel sounds are normal.   Neurological: She is alert and oriented to person, place, and time. She exhibits abnormal muscle tone. Coordination abnormal.  Reflex Scores:      Tricep reflexes are 3+ on the right side and 3+ on the left side.      Bicep reflexes are 3+ on the right side and 3+ on the left side.      Brachioradialis reflexes are 3+ on the right side and 3+ on the left side. Vitals reviewed.  Patient has 3 out of 5 strength in her upper extremities. 2 out of 5 strength in her lower extremities. She has spastic contractures at her elbows and at her knees. She  has hyperreflexia. She is confined to wheelchair and is unable to stand or walk even with maximum assistance.        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 Her current wheelchair is over 31 years old and is in disrepair from typical wear and tear. The wheelchair is no longer meeting her functional needs or positional needs. I will complete the necessary paperwork and fax this to her medical equipment company.  I also recommended the patient receive the Covid vaccination due to her chronic medical conditions.  She would be at high risk for complications.  Also will check a CBC and a CMP while the patient is here due to the medications that she takes to monitor for any liver or kidney abnormalities or to assess for any evidence of anemia secondary to nutritional deficiencies.

## 2020-01-18 NOTE — Telephone Encounter (Signed)
Clonazepam last refilled on 11/23/2019 Last office visit: 01/18/2020

## 2020-01-19 LAB — CBC WITH DIFFERENTIAL/PLATELET
Absolute Monocytes: 584 cells/uL (ref 200–950)
Basophils Absolute: 41 cells/uL (ref 0–200)
Basophils Relative: 0.7 %
Eosinophils Absolute: 59 cells/uL (ref 15–500)
Eosinophils Relative: 1 %
HCT: 44 % (ref 35.0–45.0)
Hemoglobin: 14.7 g/dL (ref 11.7–15.5)
Lymphs Abs: 2277 cells/uL (ref 850–3900)
MCH: 31.3 pg (ref 27.0–33.0)
MCHC: 33.4 g/dL (ref 32.0–36.0)
MCV: 93.8 fL (ref 80.0–100.0)
MPV: 10.2 fL (ref 7.5–12.5)
Monocytes Relative: 9.9 %
Neutro Abs: 2938 cells/uL (ref 1500–7800)
Neutrophils Relative %: 49.8 %
Platelets: 293 10*3/uL (ref 140–400)
RBC: 4.69 10*6/uL (ref 3.80–5.10)
RDW: 12.9 % (ref 11.0–15.0)
Total Lymphocyte: 38.6 %
WBC: 5.9 10*3/uL (ref 3.8–10.8)

## 2020-01-19 LAB — COMPLETE METABOLIC PANEL WITH GFR
AG Ratio: 1.4 (calc) (ref 1.0–2.5)
ALT: 13 U/L (ref 6–29)
AST: 18 U/L (ref 10–30)
Albumin: 4.2 g/dL (ref 3.6–5.1)
Alkaline phosphatase (APISO): 48 U/L (ref 31–125)
BUN: 7 mg/dL (ref 7–25)
CO2: 23 mmol/L (ref 20–32)
Calcium: 9.8 mg/dL (ref 8.6–10.2)
Chloride: 106 mmol/L (ref 98–110)
Creat: 0.56 mg/dL (ref 0.50–1.10)
GFR, Est African American: 145 mL/min/{1.73_m2} (ref >=60)
GFR, Est Non African American: 125 mL/min/{1.73_m2} (ref >=60)
Globulin: 3.1 g/dL (calc) (ref 1.9–3.7)
Glucose, Bld: 86 mg/dL (ref 65–99)
Potassium: 4 mmol/L (ref 3.5–5.3)
Sodium: 139 mmol/L (ref 135–146)
Total Bilirubin: 0.5 mg/dL (ref 0.2–1.2)
Total Protein: 7.3 g/dL (ref 6.1–8.1)

## 2020-02-04 ENCOUNTER — Telehealth: Payer: Self-pay | Admitting: Family Medicine

## 2020-02-04 NOTE — Telephone Encounter (Signed)
Cb# [phone] Pt was prescribe medication for drooling  while back would like to start back taking

## 2020-02-08 ENCOUNTER — Other Ambulatory Visit: Payer: Self-pay

## 2020-02-08 MED ORDER — GLYCOPYRROLATE 1 MG PO TABS: 1.0000 mg | ORAL_TABLET | Freq: Every day | ORAL | 5 refills | Status: DC

## 2020-02-08 NOTE — Telephone Encounter (Signed)
Could try glycopyrrolate 1 mg poqday

## 2020-02-08 NOTE — Telephone Encounter (Signed)
Rx sent in

## 2020-02-16 ENCOUNTER — Other Ambulatory Visit: Payer: Self-pay | Admitting: Family Medicine

## 2020-02-17 NOTE — Telephone Encounter (Signed)
Ok to refill??  Last office visit 01/18/2020.  Last refill 01/19/2020.  Ok to add refills to prescription?

## 2020-02-22 ENCOUNTER — Other Ambulatory Visit: Payer: Self-pay | Admitting: Physical Medicine & Rehabilitation

## 2020-02-22 DIAGNOSIS — G800 Spastic quadriplegic cerebral palsy: Secondary | ICD-10-CM

## 2020-03-22 ENCOUNTER — Other Ambulatory Visit: Payer: Self-pay

## 2020-03-22 ENCOUNTER — Ambulatory Visit (INDEPENDENT_AMBULATORY_CARE_PROVIDER_SITE_OTHER): Payer: Medicare Other | Admitting: Family Medicine

## 2020-03-22 VITALS — BP 120/78 | HR 147 | Temp 100.3°F | Ht 61.0 in

## 2020-03-22 DIAGNOSIS — M79675 Pain in left toe(s): Secondary | ICD-10-CM | POA: Diagnosis not present

## 2020-03-22 MED ORDER — SULFAMETHOXAZOLE-TRIMETHOPRIM 800-160 MG PO TABS: 1.0000 | ORAL_TABLET | Freq: Two times a day (BID) | ORAL | 0 refills | Status: DC

## 2020-03-22 NOTE — Progress Notes (Signed)
Subjective:    Patient ID: Nancy [name], female    DOB: [birth year], 29 y.o.   MRN: [id number]  HPI Patient recently developed pain in her left fourth toe after wearing a new pair of tennis shoes.  Caregiver remove the shoes and found a blister on the plantar surface at the distal portion of the toe.  The toe itself was erythematous.  Patient denies any pain today in the toe.  It is cool to the touch.  However it is erythematous and swollen and clearly deformed.  Differential diagnosis would be an undiagnosed fracture versus osteomyelitis.  Caregiver is uncertain how long the toe has been red and swollen.  There is now also a line of hyperpigmentation spreading proximally from the base of the toe as shown in the photograph. Past Medical History:  Diagnosis Date  . Anemia    h/o iron deficiency  . CP (cerebral palsy) (HCC)    with mild contracture  . IUD (intrauterine device) in place 10/2012  . Menorrhagia with regular cycle 11/11/2012  . Mobility impaired 11/11/2012  . Mood disorder (HCC)   . MR (mental retardation)   . Peripheral vascular disease (HCC) 10/02/2006   bilateral DVT lower legs  . Seizures (HCC)    childhood  . Wheelchair bound    r/t CP   Past Surgical History:  Procedure Laterality Date  . EYE SURGERY     as a child  . HIP SURGERY Right 2003  . INTRAUTERINE DEVICE (IUD) INSERTION N/A 11/12/2012   Procedure: INTRAUTERINE DEVICE (IUD) INSERTION;  Surgeon: Sherron Monday, MD;  Location: [facility];  Service: Gynecology;  Laterality: N/A;  . INTRAUTERINE DEVICE (IUD) INSERTION N/A 10/23/2018   Procedure: INTRAUTERINE DEVICE (IUD) REPLACEMENT;  Surgeon: Sherian Rein, MD;  Location: [facility];  Service: Gynecology;  Laterality: N/A;  . LEG SURGERY     as a child  . MULTIPLE TOOTH EXTRACTIONS  10/18/10   Current Outpatient Medications on File Prior to Visit  Medication Sig Dispense Refill  . ARIPiprazole (ABILIFY) 5 MG tablet TAKE ONE TABLET BY  MOUTH DAILY 30 tablet 11  . baclofen (LIORESAL) 20 MG tablet TAKE ONE TABLET BY MOUTH THREE TIMES A DAY 90 tablet 1  . clonazePAM (KLONOPIN) 1 MG tablet TAKE ONE TABLET BY MOUTH TWICE A DAY AS NEEDED FOR ANXIETY 60 tablet 3  . desonide (DESOWEN) 0.05 % cream Apply topically 2 (two) times daily. 30 g 0  . glycopyrrolate (ROBINUL) 1 MG tablet Take 1 tablet (1 mg total) by mouth daily. 30 tablet 5  . ketoconazole (NIZORAL) 2 % shampoo APPLY ONE APPLICATION TOPICALLY THREE TIMES WEEKLY 120 mL 2  . levonorgestrel (MIRENA) 20 MCG/24HR IUD 1 each by Intrauterine route once.     No current facility-administered medications on file prior to visit.   No Known Allergies Social History   Socioeconomic History  . Marital status: Single    Spouse name: Not on file  . Number of children: Not on file  . Years of education: Not on file  . Highest education level: Not on file  Occupational History  . Not on file  Tobacco Use  . Smoking status: Never Smoker  . Smokeless tobacco: Never Used  Vaping Use  . Vaping Use: Never used  Substance and Sexual Activity  . Alcohol use: No    Comment: Pt denies  . Drug use: No    Comment: Pt denies  . Sexual activity: Never    Comment:  lives in a group home.  single.  Other Topics Concern  . Not on file  Social History Narrative   Lives in an apartment with [facility] (assisted living)   Left handed    Social Determinants of Health   Financial Resource Strain:   . Difficulty of Paying Living Expenses:   Food Insecurity:   . Worried About Programme researcher, broadcasting/film/video in the Last Year:   . Barista in the Last Year:   Transportation Needs:   . Freight forwarder (Medical):   Marland Kitchen Lack of Transportation (Non-Medical):   Physical Activity:   . Days of Exercise per Week:   . Minutes of Exercise per Session:   Stress:   . Feeling of Stress :   Social Connections:   . Frequency of Communication with Friends and Family:   . Frequency of Social Gatherings with  Friends and Family:   . Attends Religious Services:   . Active Member of Clubs or Organizations:   . Attends Banker Meetings:   Marland Kitchen Marital Status:   Intimate Partner Violence:   . Fear of Current or Ex-Partner:   . Emotionally Abused:   Marland Kitchen Physically Abused:   . Sexually Abused:      Review of Systems  All other systems reviewed and are negative.      Objective:   Physical Exam  Constitutional: She is oriented to person, place, and time. She appears well-developed and well-nourished.  Cardiovascular: Normal rate, regular rhythm and normal heart sounds.  Exam reveals no gallop and no friction rub.   No murmur heard. Pulmonary/Chest: Effort normal and breath sounds normal. No respiratory distress. She has no wheezes. She has no rales.  Abdominal: Soft. Bowel sounds are normal.  Neurological: She is alert and oriented to person, place, and time. She exhibits abnormal muscle tone. Coordination abnormal.  Reflex Scores:      Tricep reflexes are 3+ on the right side and 3+ on the left side.      Bicep reflexes are 3+ on the right side and 3+ on the left side.      Brachioradialis reflexes are 3+ on the right side and 3+ on the left side. Vitals reviewed.  Patient has 3 out of 5 strength in her upper extremities. 2 out of 5 strength in her lower extremities. She has spastic contractures at her elbows and at her knees. She has hyperreflexia. She is confined to wheelchair and is unable to stand or walk even with maximum assistance.          Assessment & Plan:  Toe pain, left - Plan: DG Foot Complete Left  I believe the patient likely has chronic osteomyelitis.  Obtain an x-ray of the foot to evaluate further.  X-rays primarily to rule out fracture.  If x-ray confirms chronic osteomyelitis, I would recommend consultation with surgery/orthopedic surgery to remove the diseased portion of the bone.  Meanwhile start the patient on Bactrim double strength tablets twice daily to  cover staph until x-ray results have returned

## 2020-03-23 ENCOUNTER — Ambulatory Visit
Admission: RE | Admit: 2020-03-23 | Discharge: 2020-03-23 | Disposition: A | Discharge status: Home / Self Care | Payer: Medicare Other | Source: Ambulatory Visit | Attending: Family Medicine | Admitting: Family Medicine

## 2020-03-23 DIAGNOSIS — M810 Age-related osteoporosis without current pathological fracture: Secondary | ICD-10-CM | POA: Diagnosis not present

## 2020-03-23 DIAGNOSIS — M2012 Hallux valgus (acquired), left foot: Secondary | ICD-10-CM | POA: Diagnosis not present

## 2020-03-23 DIAGNOSIS — M7989 Other specified soft tissue disorders: Secondary | ICD-10-CM | POA: Diagnosis not present

## 2020-03-23 DIAGNOSIS — M79675 Pain in left toe(s): Secondary | ICD-10-CM

## 2020-03-24 ENCOUNTER — Other Ambulatory Visit: Payer: Self-pay

## 2020-03-24 DIAGNOSIS — M869 Osteomyelitis, unspecified: Secondary | ICD-10-CM

## 2020-03-25 ENCOUNTER — Other Ambulatory Visit: Payer: Self-pay

## 2020-03-25 ENCOUNTER — Ambulatory Visit (INDEPENDENT_AMBULATORY_CARE_PROVIDER_SITE_OTHER): Payer: Medicare Other | Admitting: Podiatry

## 2020-03-25 ENCOUNTER — Ambulatory Visit (INDEPENDENT_AMBULATORY_CARE_PROVIDER_SITE_OTHER): Payer: Medicare Other

## 2020-03-25 ENCOUNTER — Other Ambulatory Visit: Payer: Self-pay | Admitting: Podiatry

## 2020-03-25 DIAGNOSIS — M722 Plantar fascial fibromatosis: Secondary | ICD-10-CM | POA: Diagnosis not present

## 2020-03-25 DIAGNOSIS — I999 Unspecified disorder of circulatory system: Secondary | ICD-10-CM

## 2020-03-25 DIAGNOSIS — M79672 Pain in left foot: Secondary | ICD-10-CM | POA: Diagnosis not present

## 2020-03-25 DIAGNOSIS — Z993 Dependence on wheelchair: Secondary | ICD-10-CM

## 2020-03-25 DIAGNOSIS — I96 Gangrene, not elsewhere classified: Secondary | ICD-10-CM

## 2020-03-29 ENCOUNTER — Encounter: Payer: Self-pay | Admitting: Podiatry

## 2020-03-29 NOTE — Progress Notes (Signed)
Subjective:  Patient ID: Nancy [name], female    DOB: 10/07/1990,  MRN: [id number]  Chief Complaint  Patient presents with  . Foot Pain    pt is here for possible osteomyelitis of the left foot 4th toe, and possible second toe which has been going on for about 2 weeks. Pt states it started as a cyst and states that it stated to blister. Pt has no overall pain.    29 y.o. female presents with the above complaint.  Patient presents with a complaint of left fourth digit possible osteomyelitis.  Patient states been going on for about 2 weeks.  She started noticing discoloration of the fourth toe.  Patient states that her shoe may have rubbed against it causing a blister formation.  Patient's father is a diabetic.  She is not a diabetic.  Patient is primarily wheelchair-bound and is not able to walk.  She is concerned that it she does not want to lose it digit or have any kind of infection.  She denies any other acute complaints.  She would like to discuss other treatment options.  Patient was placed on Bactrim by her primary care physician.  I have asked her to complete the course.  Patient states understanding   Review of Systems: Negative except as noted in the HPI. Denies N/V/F/Ch.  Past Medical History:  Diagnosis Date  . Anemia    h/o iron deficiency  . CP (cerebral palsy) (HCC)    with mild contracture  . IUD (intrauterine device) in place 10/2012  . Menorrhagia with regular cycle 11/11/2012  . Mobility impaired 11/11/2012  . Mood disorder (HCC)   . MR (mental retardation)   . Peripheral vascular disease (HCC) 10/02/2006   bilateral DVT lower legs  . Seizures (HCC)    childhood  . Wheelchair bound    r/t CP    Current Outpatient Medications:  .  ARIPiprazole (ABILIFY) 5 MG tablet, TAKE ONE TABLET BY MOUTH DAILY, Disp: 30 tablet, Rfl: 11 .  baclofen (LIORESAL) 20 MG tablet, TAKE ONE TABLET BY MOUTH THREE TIMES A DAY, Disp: 90 tablet, Rfl: 1 .  clonazePAM (KLONOPIN) 1 MG  tablet, TAKE ONE TABLET BY MOUTH TWICE A DAY AS NEEDED FOR ANXIETY, Disp: 60 tablet, Rfl: 3 .  desonide (DESOWEN) 0.05 % cream, Apply topically 2 (two) times daily., Disp: 30 g, Rfl: 0 .  glycopyrrolate (ROBINUL) 1 MG tablet, Take 1 tablet (1 mg total) by mouth daily., Disp: 30 tablet, Rfl: 5 .  ketoconazole (NIZORAL) 2 % shampoo, APPLY ONE APPLICATION TOPICALLY THREE TIMES WEEKLY, Disp: 120 mL, Rfl: 2 .  levonorgestrel (MIRENA) 20 MCG/24HR IUD, 1 each by Intrauterine route once., Disp: , Rfl:  .  sulfamethoxazole-trimethoprim (BACTRIM DS) 800-160 MG tablet, Take 1 tablet by mouth 2 (two) times daily., Disp: 14 tablet, Rfl: 0  Social History   Tobacco Use  Smoking Status Never Smoker  Smokeless Tobacco Never Used    No Known Allergies Objective:  There were no vitals filed for this visit. There is no height or weight on file to calculate BMI. Constitutional Well developed. Well nourished.  Vascular Dorsalis pedis pulses non palpable bilaterally. Posterior tibial pulses non palpable bilaterally. Capillary refill normal to all digits.  No cyanosis or clubbing noted. Pedal hair growth normal.  Neurologic Normal speech. Oriented to person, place, and time. Epicritic sensation to light touch grossly present bilaterally.  Dermatologic Nails well groomed and normal in appearance. No open wounds. No skin lesions.  Orthopedic:  Left fourth brachymetatarsia was noted with fourth toe flailing.  No pain on palpation.  Mild superficial gangrenous changes noted to the fourth digit.  No ulceration present.  No frictional blister noted.  No any other clinical signs of infection noted.   Radiographs: 3 views of skeletally mature the left foot: No osteomyelitic changes noted without any cortical destruction.  No cortical destruction noted.  No soft tissue emphysema noted.  No other bony abnormalities identified.  Brachymetatarsia of the fourth digit noted. Assessment:   1. Foot pain, left   2.  Wheelchair bound   3. Vascular abnormality   4. Toe gangrene (HCC)    Plan:  Patient was evaluated and treated and all questions answered.  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 -The patient will benefit from ABIs PVRs to assess the vascular flow to the lower extremity followed by intervention. -I have asked her to complete the course of Bactrim however at this time I am not worried about infection as patient has stable superficial gangrene of the digit that can self resolve. -Surgical shoe was dispensed -I will see her back after ABIs PVRs.  No follow-ups on file.

## 2020-03-30 ENCOUNTER — Telehealth: Payer: Self-pay | Admitting: *Deleted

## 2020-03-30 DIAGNOSIS — Z993 Dependence on wheelchair: Secondary | ICD-10-CM

## 2020-03-30 DIAGNOSIS — I96 Gangrene, not elsewhere classified: Secondary | ICD-10-CM

## 2020-03-30 DIAGNOSIS — I999 Unspecified disorder of circulatory system: Secondary | ICD-10-CM

## 2020-03-30 NOTE — Telephone Encounter (Signed)
-----   Message from Candelaria Stagers, DPM sent at 03/29/2020  6:01 AM EDT ----- Regarding: ABIs PVRs Hi Keely/Saleh Ulbrich,  Would you be able to order ABIs PVRs for this patient to assess the vascular flow especially to the left side.   Thank you

## 2020-03-30 NOTE — Telephone Encounter (Signed)
[facility] - MEDICAID NOTIFICATION STATES, "THIS [facility] MEDICAID MEMBER DOES NOT REQUIRE PRIOR AUTHORIZATION FOR OUTPATIENT RADIOLOGY THROUGH [facility] OR [facility] AT THIS TIME." 

## 2020-03-30 NOTE — Telephone Encounter (Signed)
Orders faxed to [facility]. 

## 2020-04-06 ENCOUNTER — Encounter: Payer: Medicare Other | Attending: Physical Medicine & Rehabilitation | Admitting: Physical Medicine & Rehabilitation

## 2020-04-06 DIAGNOSIS — G800 Spastic quadriplegic cerebral palsy: Secondary | ICD-10-CM | POA: Insufficient documentation

## 2020-04-15 ENCOUNTER — Ambulatory Visit (INDEPENDENT_AMBULATORY_CARE_PROVIDER_SITE_OTHER): Payer: Medicare Other | Admitting: Podiatry

## 2020-04-15 ENCOUNTER — Other Ambulatory Visit: Payer: Self-pay

## 2020-04-15 DIAGNOSIS — I96 Gangrene, not elsewhere classified: Secondary | ICD-10-CM

## 2020-04-15 DIAGNOSIS — I999 Unspecified disorder of circulatory system: Secondary | ICD-10-CM | POA: Diagnosis not present

## 2020-04-15 DIAGNOSIS — M79672 Pain in left foot: Secondary | ICD-10-CM | POA: Diagnosis not present

## 2020-04-15 DIAGNOSIS — Z993 Dependence on wheelchair: Secondary | ICD-10-CM | POA: Diagnosis not present

## 2020-04-18 ENCOUNTER — Other Ambulatory Visit: Payer: Self-pay | Admitting: Family Medicine

## 2020-04-19 ENCOUNTER — Encounter: Payer: Self-pay | Admitting: Podiatry

## 2020-04-19 NOTE — Progress Notes (Signed)
Subjective:  Patient ID: Nancy [name], female    DOB: 20-Nov-1990,  MRN: [id number]  No chief complaint on file.   29 y.o. female presents with the above complaint.  Patient presents with a follow-up of left fourth digit possible superficial gangrene with underlying brachymetatarsia.  Patient states she is doing well.  The digit is improving.  Her pain is improved.  She has not got ABIs PVRs done yet.  She is scheduled for sometime next week.  I will see her back right afterwards.   Review of Systems: Negative except as noted in the HPI. Denies N/V/F/Ch.  Past Medical History:  Diagnosis Date  . Anemia    h/o iron deficiency  . CP (cerebral palsy) (HCC)    with mild contracture  . IUD (intrauterine device) in place 10/2012  . Menorrhagia with regular cycle 11/11/2012  . Mobility impaired 11/11/2012  . Mood disorder (HCC)   . MR (mental retardation)   . Peripheral vascular disease (HCC) 10/02/2006   bilateral DVT lower legs  . Seizures (HCC)    childhood  . Wheelchair bound    r/t CP    Current Outpatient Medications:  .  ARIPiprazole (ABILIFY) 5 MG tablet, TAKE ONE TABLET BY MOUTH DAILY, Disp: 30 tablet, Rfl: 11 .  baclofen (LIORESAL) 20 MG tablet, TAKE ONE TABLET BY MOUTH THREE TIMES A DAY, Disp: 90 tablet, Rfl: 1 .  clonazePAM (KLONOPIN) 1 MG tablet, TAKE ONE TABLET BY MOUTH TWICE A DAY AS NEEDED FOR ANXIETY, Disp: 60 tablet, Rfl: 3 .  desonide (DESOWEN) 0.05 % cream, Apply topically 2 (two) times daily., Disp: 30 g, Rfl: 0 .  glycopyrrolate (ROBINUL) 1 MG tablet, Take 1 tablet (1 mg total) by mouth daily., Disp: 30 tablet, Rfl: 5 .  ketoconazole (NIZORAL) 2 % shampoo, APPLY ONE APPLICATION TOPICALLY THREE TIMES WEEKLY, Disp: 120 mL, Rfl: 1 .  levonorgestrel (MIRENA) 20 MCG/24HR IUD, 1 each by Intrauterine route once., Disp: , Rfl:  .  sulfamethoxazole-trimethoprim (BACTRIM DS) 800-160 MG tablet, Take 1 tablet by mouth 2 (two) times daily., Disp: 14 tablet, Rfl: 0  Social  History   Tobacco Use  Smoking Status Never Smoker  Smokeless Tobacco Never Used    No Known Allergies Objective:  There were no vitals filed for this visit. There is no height or weight on file to calculate BMI. Constitutional Well developed. Well nourished.  Vascular Dorsalis pedis pulses non palpable bilaterally. Posterior tibial pulses non palpable bilaterally. Capillary refill normal to all digits.  No cyanosis or clubbing noted. Pedal hair growth normal.  Neurologic Normal speech. Oriented to person, place, and time. Epicritic sensation to light touch grossly present bilaterally.  Dermatologic Nails well groomed and normal in appearance. No open wounds. No skin lesions.  Orthopedic:  Left fourth brachymetatarsia was noted with fourth toe flailing.  No pain on palpation.  Mild superficial gangrenous changes noted to the fourth digit.  No ulceration present.  No frictional blister noted.  No any other clinical signs of infection noted.   Radiographs: 3 views of skeletally mature the left foot: No osteomyelitic changes noted without any cortical destruction.  No cortical destruction noted.  No soft tissue emphysema noted.  No other bony abnormalities identified.  Brachymetatarsia of the fourth digit noted. Assessment:   1. Wheelchair bound   2. Vascular abnormality   3. Toe gangrene (HCC)   4. Foot pain, left    Plan:  Patient was evaluated and treated and all questions answered.  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 -The patient will benefit from ABIs PVRs to assess the vascular flow to the lower extremity followed by intervention. -I have asked her to complete  the course of Bactrim however at this time I am not worried about infection as patient has stable superficial gangrene of the digit that can self resolve. -Surgical shoe was dispensed -Still awaiting ABIs PVRs.  No follow-ups on file.

## 2020-04-27 ENCOUNTER — Ambulatory Visit (HOSPITAL_COMMUNITY)
Admission: RE | Admit: 2020-04-27 | Discharge: 2020-04-27 | Disposition: A | Discharge status: Home / Self Care | Payer: Medicare Other | Source: Ambulatory Visit | Attending: Cardiovascular Disease | Admitting: Cardiovascular Disease

## 2020-04-27 ENCOUNTER — Other Ambulatory Visit: Payer: Self-pay

## 2020-04-27 DIAGNOSIS — Z993 Dependence on wheelchair: Secondary | ICD-10-CM | POA: Diagnosis not present

## 2020-04-27 DIAGNOSIS — I999 Unspecified disorder of circulatory system: Secondary | ICD-10-CM

## 2020-04-27 DIAGNOSIS — I96 Gangrene, not elsewhere classified: Secondary | ICD-10-CM | POA: Diagnosis not present

## 2020-04-29 ENCOUNTER — Other Ambulatory Visit: Payer: Self-pay | Admitting: Physical Medicine & Rehabilitation

## 2020-04-29 DIAGNOSIS — G800 Spastic quadriplegic cerebral palsy: Secondary | ICD-10-CM

## 2020-05-04 DIAGNOSIS — Z23 Encounter for immunization: Secondary | ICD-10-CM | POA: Diagnosis not present

## 2020-05-05 ENCOUNTER — Ambulatory Visit: Payer: Medicare Other | Attending: Family Medicine | Admitting: Physical Therapy

## 2020-05-05 ENCOUNTER — Other Ambulatory Visit: Payer: Self-pay

## 2020-05-05 DIAGNOSIS — G800 Spastic quadriplegic cerebral palsy: Secondary | ICD-10-CM | POA: Diagnosis not present

## 2020-05-06 ENCOUNTER — Ambulatory Visit (INDEPENDENT_AMBULATORY_CARE_PROVIDER_SITE_OTHER): Payer: Medicare Other | Admitting: Podiatry

## 2020-05-06 ENCOUNTER — Encounter: Payer: Self-pay | Admitting: Physical Therapy

## 2020-05-06 DIAGNOSIS — Z993 Dependence on wheelchair: Secondary | ICD-10-CM | POA: Diagnosis not present

## 2020-05-06 DIAGNOSIS — I96 Gangrene, not elsewhere classified: Secondary | ICD-10-CM | POA: Diagnosis not present

## 2020-05-06 DIAGNOSIS — I999 Unspecified disorder of circulatory system: Secondary | ICD-10-CM | POA: Diagnosis not present

## 2020-05-06 NOTE — Therapy (Signed)
[facility] [address] [place], [place], [address] Phone: [phone]   Fax:  [phone]  Physical Therapy Evaluation  Patient Details  Name: Nancy [name] MRN: [id number] Date of Birth: 02-Nov-1990 Referring Provider (PT): Lynnea Ferrier, MD   Encounter Date: 05/05/2020   PT End of Session - 05/06/20 0709    Visit Number 1    Authorization Type Medicare & Medicaid    PT Start Time 1445    PT Stop Time 1558    PT Time Calculation (min) 73 min    Activity Tolerance Patient tolerated treatment well    Behavior During Therapy [facility] for tasks assessed/performed           Past Medical History:  Diagnosis Date  . Anemia    h/o iron deficiency  . CP (cerebral palsy) (HCC)    with mild contracture  . IUD (intrauterine device) in place 10/2012  . Menorrhagia with regular cycle 11/11/2012  . Mobility impaired 11/11/2012  . Mood disorder (HCC)   . MR (mental retardation)   . Peripheral vascular disease (HCC) 10/02/2006   bilateral DVT lower legs  . Seizures (HCC)    childhood  . Wheelchair bound    r/t CP    Past Surgical History:  Procedure Laterality Date  . EYE SURGERY     as a child  . HIP SURGERY Right 2003  . INTRAUTERINE DEVICE (IUD) INSERTION N/A 11/12/2012   Procedure: INTRAUTERINE DEVICE (IUD) INSERTION;  Surgeon: Sherron Monday, MD;  Location: [facility];  Service: Gynecology;  Laterality: N/A;  . INTRAUTERINE DEVICE (IUD) INSERTION N/A 10/23/2018   Procedure: INTRAUTERINE DEVICE (IUD) REPLACEMENT;  Surgeon: Sherian Rein, MD;  Location: [facility];  Service: Gynecology;  Laterality: N/A;  . LEG SURGERY     as a child  . MULTIPLE TOOTH EXTRACTIONS  10/18/10    There were no vitals filed for this visit.    Subjective Assessment - 05/06/20 0705    Subjective Pt presents for power wheelchair eval accompanied by her mother - Harrie Jeans, [employer] with [employer] present for eval     Patient is accompained by: Family member    Pertinent History CP    Patient Stated Goals obtain new power wheelchair    Currently in Pain? No/denies              [facility] PT Assessment - 05/06/20 0001      Assessment   Medical Diagnosis Cerebral Palsy     Referring Provider (PT) Lynnea Ferrier, MD    Onset Date/Surgical Date --   Congenital     Precautions   Precautions Fall      Balance Screen   Has the patient fallen in the past 6 months No    Has the patient had a decrease in activity level because of a fear of falling?  No    Is the patient reluctant to leave their home because of a fear of falling?  No             LMN for power wheelchair ([employer] M3 with power tilt and recline) will be completed; also LMN for hoyer lift will be written (pt's Mother requesting)         Objective measurements completed on examination: See above findings.               PT Education - 05/06/20 0708    Education Details power wheelchair recommendations with power tilt and recline and  seat elevator requested; also will  write LMN for hoyer lift    Person(s) Educated Patient;Parent(s)    Methods Explanation    Comprehension Verbalized understanding                       Plan - 05/06/20 0710    Clinical Impression Statement Pt presents with spastic quadriplegic CP; pt requires power wheelchair for independence with mobility - needs power tilt and recline for independence with performing pressure relief.  Harrie Jeans, ATP with [employer] present for eval.  Perm[employer]il M3 power wheelchair with power tilt and recline is recommended.    Personal Factors and Comorbidities Comorbidity 1;Time since onset of injury/illness/exacerbation    Examination-Activity Limitations Locomotion Level;Bend;Squat;Stairs;Toileting;Stand;Transfers;Hygiene/Grooming;Bathing    Examination-Participation Restrictions Cleaning;Community Activity;Volunteer;Laundry;Other;Meal Prep     Stability/Clinical Decision Making Stable/Uncomplicated    Clinical Decision Making Low    PT Frequency One time visit    PT Treatment/Interventions Other (comment)   wheelchair management   Recommended Other Services obtain power wheelchair from [employer] - Permobil M3 with power tilt and recline    Consulted and Agree with Plan of Care Patient;Family member/caregiver    Family Member Consulted mother           Patient will benefit from skilled therapeutic intervention in order to improve the following deficits and impairments:  Difficulty walking, Decreased balance, Decreased range of motion, Impaired tone, Impaired UE functional use, Decreased mobility  Visit Diagnosis: Spastic quadriplegic cerebral palsy (HCC) - Plan: PT plan of care cert/re-cert     Problem List Patient Active Problem List   Diagnosis Date Noted  . SIRS (systemic inflammatory response syndrome) (HCC) 08/09/2016  . Nausea vomiting and diarrhea 08/09/2016  . Fecal impaction (HCC) 08/09/2016  . Psychosis (HCC) 11/23/2013  . Paranoia (HCC) 11/22/2013  . Fall from wheelchair 05/29/2013  . Anemia   . CP (cerebral palsy), spastic, quadriplegic (HCC) 12/23/2012  . 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 Myoclonus 12/23/2012  . 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 Insomnia, unspecified 12/23/2012  . Menorrhagia with regular cycle 11/11/2012  . Mobility impaired 11/11/2012  . Wheelchair bound 11/11/2012    DildayDonavan Burnet, PT 05/06/2020, 7:18 AM  [facility] [address] [place], [place], [address] Phone: [phone]   Fax:  [phone]  Name: Nancy [name] MRN: [id number] Date of Birth: July 26, 1991

## 2020-05-10 ENCOUNTER — Encounter: Payer: Self-pay | Admitting: Podiatry

## 2020-05-10 NOTE — Progress Notes (Signed)
Subjective:  Patient ID: Nancy [name], female    DOB: Nov 08, 1990,  MRN: [id number]  Chief Complaint  Patient presents with  . Toe check    PT stated that she has no concerns or pain just here for a check on her toe     29 y.o. female presents with the above complaint.  Patient is here for follow-up on left fourth digit possible superficial gangrene with underlying brachymetatarsia.  Patient states she is doing well.  She no longer has any pain.  The digit is doing well.  She denies any other acute complaints   Review of Systems: Negative except as noted in the HPI. Denies N/V/F/Ch.  Past Medical History:  Diagnosis Date  . Anemia    h/o iron deficiency  . CP (cerebral palsy) (HCC)    with mild contracture  . IUD (intrauterine device) in place 10/2012  . Menorrhagia with regular cycle 11/11/2012  . Mobility impaired 11/11/2012  . Mood disorder (HCC)   . MR (mental retardation)   . Peripheral vascular disease (HCC) 10/02/2006   bilateral DVT lower legs  . Seizures (HCC)    childhood  . Wheelchair bound    r/t CP    Current Outpatient Medications:  .  ARIPiprazole (ABILIFY) 5 MG tablet, TAKE ONE TABLET BY MOUTH DAILY, Disp: 30 tablet, Rfl: 11 .  baclofen (LIORESAL) 20 MG tablet, TAKE ONE TABLET BY MOUTH THREE TIMES A DAY, Disp: 90 tablet, Rfl: 1 .  clonazePAM (KLONOPIN) 1 MG tablet, TAKE ONE TABLET BY MOUTH TWICE A DAY AS NEEDED FOR ANXIETY, Disp: 60 tablet, Rfl: 3 .  desonide (DESOWEN) 0.05 % cream, Apply topically 2 (two) times daily., Disp: 30 g, Rfl: 0 .  glycopyrrolate (ROBINUL) 1 MG tablet, Take 1 tablet (1 mg total) by mouth daily., Disp: 30 tablet, Rfl: 5 .  ketoconazole (NIZORAL) 2 % shampoo, APPLY ONE APPLICATION TOPICALLY THREE TIMES WEEKLY, Disp: 120 mL, Rfl: 1 .  levonorgestrel (MIRENA) 20 MCG/24HR IUD, 1 each by Intrauterine route once., Disp: , Rfl:  .  sulfamethoxazole-trimethoprim (BACTRIM DS) 800-160 MG tablet, Take 1 tablet by mouth 2 (two) times daily.,  Disp: 14 tablet, Rfl: 0  Social History   Tobacco Use  Smoking Status Never Smoker  Smokeless Tobacco Never Used    No Known Allergies Objective:  There were no vitals filed for this visit. There is no height or weight on file to calculate BMI. Constitutional Well developed. Well nourished.  Vascular Dorsalis pedis pulses non palpable bilaterally. Posterior tibial pulses non palpable bilaterally. Capillary refill normal to all digits.  No cyanosis or clubbing noted. Pedal hair growth normal.  Neurologic Normal speech. Oriented to person, place, and time. Epicritic sensation to light touch grossly present bilaterally.  Dermatologic Nails well groomed and normal in appearance. No open wounds. No skin lesions.  Orthopedic:  Left fourth brachymetatarsia was noted with fourth toe flailing.  No pain on palpation.  Gangrene has resolved.  No clinical signs of infection noted.  No ulceration present.  No frictional blister noted.  No any other clinical signs of infection noted.   Radiographs: 3 views of skeletally mature the left foot: No osteomyelitic changes noted without any cortical destruction.  No cortical destruction noted.  No soft tissue emphysema noted.  No other bony abnormalities identified.  Brachymetatarsia of the fourth digit noted. Assessment:   1. Wheelchair bound   2. Vascular abnormality   3. Toe gangrene [facility])    Plan:  Patient was evaluated  and treated and all questions answered.  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  Patient no longer has pain to the fourth digit.  There is good rubor/cap refill to the fourth digit as well. -Patient can transition to regular sneakers. -ABIs PVRs were reviewed with the patient they are within normal limits. -At this time patient is officially discharged from my care.  If there is continued foot and ankle issues that arise in the future I have asked her to come  back and see me.  Patient states understanding  No follow-ups on file.

## 2020-05-18 ENCOUNTER — Other Ambulatory Visit: Payer: Self-pay | Admitting: Family Medicine

## 2020-05-19 NOTE — Telephone Encounter (Signed)
Ok to refill??  Last office visit 03/22/2020.  Last refill 02/18/2020, #3 refills.

## 2020-06-12 ENCOUNTER — Encounter (HOSPITAL_COMMUNITY): Payer: Self-pay | Admitting: Emergency Medicine

## 2020-06-12 ENCOUNTER — Emergency Department (HOSPITAL_COMMUNITY)
Admission: EM | Admit: 2020-06-12 | Discharge: 2020-06-13 | Disposition: A | Discharge status: Home / Self Care | Payer: Medicare Other | Attending: Emergency Medicine | Admitting: Emergency Medicine

## 2020-06-12 ENCOUNTER — Other Ambulatory Visit: Payer: Self-pay

## 2020-06-12 DIAGNOSIS — R109 Unspecified abdominal pain: Secondary | ICD-10-CM | POA: Insufficient documentation

## 2020-06-12 DIAGNOSIS — Z20822 Contact with and (suspected) exposure to covid-19: Secondary | ICD-10-CM | POA: Diagnosis not present

## 2020-06-12 DIAGNOSIS — R112 Nausea with vomiting, unspecified: Secondary | ICD-10-CM | POA: Diagnosis not present

## 2020-06-12 DIAGNOSIS — R000 Tachycardia, unspecified: Secondary | ICD-10-CM | POA: Insufficient documentation

## 2020-06-12 DIAGNOSIS — R509 Fever, unspecified: Secondary | ICD-10-CM | POA: Diagnosis not present

## 2020-06-12 DIAGNOSIS — R1011 Right upper quadrant pain: Secondary | ICD-10-CM | POA: Diagnosis not present

## 2020-06-12 LAB — CBC
HCT: 43.1 % (ref 36.0–46.0)
Hemoglobin: 13.6 g/dL (ref 12.0–15.0)
MCH: 30.1 pg (ref 26.0–34.0)
MCHC: 31.6 g/dL (ref 30.0–36.0)
MCV: 95.4 fL (ref 80.0–100.0)
Platelets: 312 10*3/uL (ref 150–400)
RBC: 4.52 MIL/uL (ref 3.87–5.11)
RDW: 12.8 % (ref 11.5–15.5)
WBC: 8.5 10*3/uL (ref 4.0–10.5)
nRBC: 0 % (ref 0.0–0.2)

## 2020-06-12 LAB — I-STAT BETA HCG BLOOD, ED (MC, WL, AP ONLY): I-stat hCG, quantitative: <5 m[IU]/mL (ref <5)

## 2020-06-12 LAB — COMPREHENSIVE METABOLIC PANEL
ALT: 23 U/L (ref 0–44)
AST: 26 U/L (ref 15–41)
Albumin: 3.9 g/dL (ref 3.5–5.0)
Alkaline Phosphatase: 42 U/L (ref 38–126)
Anion gap: 9 (ref 5–15)
BUN: 6 mg/dL (ref 6–20)
CO2: 23 mmol/L (ref 22–32)
Calcium: 9.1 mg/dL (ref 8.9–10.3)
Chloride: 107 mmol/L (ref 98–111)
Creatinine, Ser: 0.5 mg/dL (ref 0.44–1.00)
GFR, Estimated: >60 mL/min (ref >60)
Glucose, Bld: 111 mg/dL — ABNORMAL HIGH (ref 70–99)
Potassium: 3.6 mmol/L (ref 3.5–5.1)
Sodium: 139 mmol/L (ref 135–145)
Total Bilirubin: 0.9 mg/dL (ref 0.3–1.2)
Total Protein: 7.4 g/dL (ref 6.5–8.1)

## 2020-06-12 LAB — RESPIRATORY PANEL BY RT PCR (FLU A&B, COVID)
Influenza A by PCR: NEGATIVE
Influenza B by PCR: NEGATIVE
SARS Coronavirus 2 by RT PCR: NEGATIVE

## 2020-06-12 LAB — LIPASE, BLOOD: Lipase: 72 U/L — ABNORMAL HIGH (ref 11–51)

## 2020-06-12 NOTE — ED Triage Notes (Signed)
Pt to triage via [facility] from home.  Reports nausea, vomiting, generalized abd pain, and fever x 2 days.  IV- 20 g L arm.  700cc NS PTA and 4 mg Zofran.  Pt requesting COVID testing.

## 2020-06-13 ENCOUNTER — Emergency Department (HOSPITAL_COMMUNITY): Payer: Medicare Other

## 2020-06-13 DIAGNOSIS — R1011 Right upper quadrant pain: Secondary | ICD-10-CM | POA: Diagnosis not present

## 2020-06-13 DIAGNOSIS — R112 Nausea with vomiting, unspecified: Secondary | ICD-10-CM | POA: Diagnosis not present

## 2020-06-13 MED ORDER — SODIUM CHLORIDE 0.9 % IV BOLUS
1000.0000 mL | Freq: Once | INTRAVENOUS | Status: AC
  Administered 2020-06-13: 1000 mL via INTRAVENOUS

## 2020-06-13 MED ORDER — ONDANSETRON 4 MG PO TBDP: 4.0000 mg | ORAL_TABLET | Freq: Three times a day (TID) | ORAL | 0 refills | Status: DC | PRN

## 2020-06-13 NOTE — Discharge Instructions (Signed)
Use Zofran for any further nausea. If fever develops, treat with Tylenol.   Follow up with your doctor for recheck if symptoms persist or worsen.

## 2020-06-13 NOTE — ED Provider Notes (Signed)
[facility] EMERGENCY DEPARTMENT Provider Note   CSN: [id number] Arrival date & time: 06/12/20  1757     History Chief Complaint  Patient presents with  . Abdominal Pain    Nancy [name] is a 29 y.o. female.  Patient with history of cerebral palsy, MR, PVD, seizures, presents to ED with live-in caregiver for evaluation of nausea and vomiting for the past 2 days. Episodes of vomiting have been infrequent, with last emesis yesterday morning. No hematemesis. No diarrhea. Per caregiver, she has had regular soft bowel movements daily - her normal - including after arrival to the ED. She reports the EMS recorded a fever of 103 and this was treated in route. She was not aware she had a fever at home. She denies abdominal pain, chest pain, SOB, cough.   The history is provided by the patient, a caregiver and the EMS personnel. No language interpreter was used.  Abdominal Pain Associated symptoms: fever, nausea and vomiting   Associated symptoms: no chest pain, no chills, no constipation, no cough, no diarrhea, no dysuria, no shortness of breath and no sore throat        Past Medical History:  Diagnosis Date  . Anemia    h/o iron deficiency  . CP (cerebral palsy) (HCC)    with mild contracture  . IUD (intrauterine device) in place 10/2012  . Menorrhagia with regular cycle 11/11/2012  . Mobility impaired 11/11/2012  . Mood disorder (HCC)   . MR (mental retardation)   . Peripheral vascular disease (HCC) 10/02/2006   bilateral DVT lower legs  . Seizures (HCC)    childhood  . Wheelchair bound    r/t CP    Patient Active Problem List   Diagnosis Date Noted  . SIRS (systemic inflammatory response syndrome) (HCC) 08/09/2016  . Nausea vomiting and diarrhea 08/09/2016  . Fecal impaction (HCC) 08/09/2016  . Psychosis (HCC) 11/23/2013  . Paranoia (HCC) 11/22/2013  . Fall from wheelchair 05/29/2013  . Anemia   . CP (cerebral palsy), spastic, quadriplegic (HCC)  12/23/2012  . 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 Myoclonus 12/23/2012  . 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 Insomnia, unspecified 12/23/2012  . Menorrhagia with regular cycle 11/11/2012  . Mobility impaired 11/11/2012  . Wheelchair bound 11/11/2012    Past Surgical History:  Procedure Laterality Date  . EYE SURGERY     as a child  . HIP SURGERY Right 2003  . INTRAUTERINE DEVICE (IUD) INSERTION N/A 11/12/2012   Procedure: INTRAUTERINE DEVICE (IUD) INSERTION;  Surgeon: Sherron Monday, MD;  Location: [facility];  Service: Gynecology;  Laterality: N/A;  . INTRAUTERINE DEVICE (IUD) INSERTION N/A 10/23/2018   Procedure: INTRAUTERINE DEVICE (IUD) REPLACEMENT;  Surgeon: Sherian Rein, MD;  Location: [facility];  Service: Gynecology;  Laterality: N/A;  . LEG SURGERY     as a child  . MULTIPLE TOOTH EXTRACTIONS  10/18/10     OB History   No obstetric history on file.     Family History  Problem Relation Age of Onset  . Cancer Mother   . Diabetes Father   . Alzheimer's disease Maternal Grandfather   . Heart attack Paternal Grandmother        Died between 55 or 12 years old    Social History   Tobacco Use  . Smoking status: Never Smoker  . Smokeless tobacco: Never Used  Vaping Use  .  Vaping Use: Never used  Substance Use Topics  . Alcohol use: No    Comment: Pt denies  . Drug use: No    Comment: Pt denies    Home Medications Prior to Admission medications   Medication Sig Start Date End Date Taking? Authorizing Provider  ARIPiprazole (ABILIFY) 5 MG tablet TAKE ONE TABLET BY MOUTH DAILY 11/26/19   Donita Brooks, MD  baclofen (LIORESAL) 20 MG tablet TAKE ONE TABLET BY MOUTH THREE TIMES A DAY 04/29/20   Ranelle Oyster, MD  clonazePAM (KLONOPIN) 1 MG tablet TAKE ONE TABLET BY MOUTH TWICE A DAY AS NEEDED FOR ANXIETY 05/19/20   Donita Brooks,  MD  desonide (DESOWEN) 0.05 % cream Apply topically 2 (two) times daily. 05/19/19   Donita Brooks, MD  glycopyrrolate (ROBINUL) 1 MG tablet Take 1 tablet (1 mg total) by mouth daily. 02/08/20   Donita Brooks, MD  ketoconazole (NIZORAL) 2 % shampoo APPLY ONE APPLICATION TOPICALLY THREE TIMES WEEKLY 05/19/20   Donita Brooks, MD  levonorgestrel (MIRENA) 20 MCG/24HR IUD 1 each by Intrauterine route once.    [provider]  sulfamethoxazole-trimethoprim (BACTRIM DS) 800-160 MG tablet Take 1 tablet by mouth 2 (two) times daily. 03/22/20   Donita Brooks, MD    Allergies    Patient has no known allergies.  Review of Systems   Review of Systems  Constitutional: Positive for fever. Negative for chills.  HENT: Negative.  Negative for congestion and sore throat.   Respiratory: Negative.  Negative for cough and shortness of breath.   Cardiovascular: Negative.  Negative for chest pain.  Gastrointestinal: Positive for abdominal pain, nausea and vomiting. Negative for constipation and diarrhea.  Genitourinary: Negative.  Negative for decreased urine volume, difficulty urinating and dysuria.  Musculoskeletal: Negative.  Negative for myalgias.  Skin: Negative.  Negative for color change and rash.  Neurological: Negative.  Negative for weakness and light-headedness.  Psychiatric/Behavioral: Negative for confusion.    Physical Exam Updated Vital Signs BP 127/75 (BP Location: Left Arm)   Pulse 88   Temp 98.3 F (36.8 C) (Oral)   Resp 16   SpO2 100%   Physical Exam Vitals and nursing note reviewed.  Constitutional:      Appearance: She is well-developed.  HENT:     Head: Normocephalic.     Mouth/Throat:     Mouth: Mucous membranes are dry.  Eyes:     Conjunctiva/sclera: Conjunctivae normal.  Cardiovascular:     Rate and Rhythm: Regular rhythm. Tachycardia present.     Heart sounds: No murmur heard.   Pulmonary:     Effort: Pulmonary effort is normal.     Breath  sounds: Normal breath sounds. No wheezing, rhonchi or rales.  Abdominal:     General: Bowel sounds are normal.     Palpations: Abdomen is soft.     Tenderness: There is abdominal tenderness (RUQ tenderness without guarding). There is no guarding or rebound.  Musculoskeletal:        General: Normal range of motion.     Cervical back: Normal range of motion and neck supple.     Comments: L>R UE  Contractures   Skin:    General: Skin is warm and dry.     Findings: No rash.  Neurological:     Mental Status: She is alert and oriented to person, place, and time.     ED Results / Procedures / Treatments   Labs (all labs ordered are listed,  but only abnormal results are displayed) Labs Reviewed  LIPASE, BLOOD - Abnormal; Notable for the following components:      Result Value   Lipase 72 (*)    All other components within normal limits  COMPREHENSIVE METABOLIC PANEL - Abnormal; Notable for the following components:   Glucose, Bld 111 (*)    All other components within normal limits  RESPIRATORY PANEL BY RT PCR (FLU A&B, COVID)  CBC  URINALYSIS, ROUTINE W REFLEX MICROSCOPIC  I-STAT BETA HCG BLOOD, ED ([facility], [facility], [facility] ONLY)   Results for orders placed or performed during the hospital encounter of 06/12/20  Respiratory Panel by RT PCR (Flu A&B, Covid) - Nasopharyngeal Swab   Specimen: Nasopharyngeal Swab  Result Value Ref Range   SARS Coronavirus 2 by RT PCR NEGATIVE NEGATIVE   Influenza A by PCR NEGATIVE NEGATIVE   Influenza B by PCR NEGATIVE NEGATIVE  Lipase, blood  Result Value Ref Range   Lipase 72 (H) 11 - 51 U/L  Comprehensive metabolic panel  Result Value Ref Range   Sodium 139 135 - 145 mmol/L   Potassium 3.6 3.5 - 5.1 mmol/L   Chloride 107 98 - 111 mmol/L   CO2 23 22 - 32 mmol/L   Glucose, Bld 111 (H) 70 - 99 mg/dL   BUN 6 6 - 20 mg/dL   Creatinine, Ser 5.91 0.44 - 1.00 mg/dL   Calcium 9.1 8.9 - 63.8 mg/dL   Total Protein 7.4 6.5 - 8.1 g/dL   Albumin 3.9 3.5 - 5.0 g/dL     AST 26 15 - 41 U/L   ALT 23 0 - 44 U/L   Alkaline Phosphatase 42 38 - 126 U/L   Total Bilirubin 0.9 0.3 - 1.2 mg/dL   GFR, Estimated >46 >65 mL/min   Anion gap 9 5 - 15  CBC  Result Value Ref Range   WBC 8.5 4.0 - 10.5 K/uL   RBC 4.52 3.87 - 5.11 MIL/uL   Hemoglobin 13.6 12.0 - 15.0 g/dL   HCT 99.3 36 - 46 %   MCV 95.4 80.0 - 100.0 fL   MCH 30.1 26.0 - 34.0 pg   MCHC 31.6 30.0 - 36.0 g/dL   RDW 57.0 17.7 - 93.9 %   Platelets 312 150 - 400 K/uL   nRBC 0.0 0.0 - 0.2 %  I-Stat beta hCG blood, ED  Result Value Ref Range   I-stat hCG, quantitative <5.0 <5 mIU/mL   Comment 3            EKG None  Radiology No results found.  Procedures Procedures (including critical care time)  Medications Ordered in ED Medications  sodium chloride 0.9 % bolus 1,000 mL (has no administration in time range)    ED Course  I have reviewed the triage vital signs and the nursing notes.  Pertinent labs & imaging results that were available during my care of the patient were reviewed by me and considered in my medical decision making (see chart for details).    MDM Rules/Calculators/A&P                          Patient to ED with ss/sxs as per HPI.   Overall well appearing. No vomiting since arrival 12 hours ago. She was given medication for fever by EMS and this did not cause vomiting.   Abdominal exam essentially benign, however, she does report tenderness to palpation on repeat exam. Given history of  CP, MR, consider exam unreliable. No leukocytosis or LFT dysfunction. She has minimally elevated lipase at 72. Will obtain a gall bladder ultrasound for further evaluation. She is also tachycardic on the monitor at 110's and appears dry on exam. Will provide 1 L fluids.   Patient care signed out to oncoming provider pending temperature recheck, ultrasound and IVF's/tachycardia.   Final Clinical Impression(s) / ED Diagnoses Final diagnoses:  None   1. Vomiting  Rx / DC Orders ED  Discharge Orders    None       Elpidio AnisUpstill, Raza Bayless, PA-C 06/13/20 [id number]    Derwood KaplanNanavati, Ankit, MD 06/13/20 [phone]

## 2020-06-13 NOTE — ED Provider Notes (Signed)
7:16 AM Care assumed from Junius Finner and Rhunette Croft, MD.  At time of transfer care, patient is awaiting right upper quadrant ultrasound and reassessment after rehydration.  If ultrasound is reassuring and patient is feeling improved, dissipate discharge back to facility for outpatient follow-up after rehydration.  8:47 AM Patient's ultrasound came back showing no evidence of acute cholecystitis. I went to reassess the patient as she is resting comfortably. Family with patient agrees with plan for discharge and PCP follow-up. They have no other questions or concerns and patient discharged in good condition with improved symptoms.  Clinical Impression: 1. Non-intractable vomiting with nausea, unspecified vomiting type     Disposition: Discharge  Condition: Good  I have discussed the results, Dx and Tx plan with the pt(& family if present). He/she/they expressed understanding and agree(s) with the plan. Discharge instructions discussed at great length. Strict return precautions discussed and pt &/or family have verbalized understanding of the instructions. No further questions at time of discharge.    New Prescriptions   ONDANSETRON (ZOFRAN ODT) 4 MG DISINTEGRATING TABLET    Take 1 tablet (4 mg total) by mouth every 8 (eight) hours as needed for nausea or vomiting.    Follow Up: Donita Brooks, MD [address] [place] [address] [phone]   As needed, If symptoms worsen  [facility] EMERGENCY DEPARTMENT [address] [id number] mc [place] [address] [phone]       Parrish Bonn, Canary Brim, MD 06/13/20 [phone]

## 2020-06-20 ENCOUNTER — Ambulatory Visit: Payer: Medicare Other | Admitting: Family Medicine

## 2020-07-09 DIAGNOSIS — Z23 Encounter for immunization: Secondary | ICD-10-CM | POA: Diagnosis not present

## 2020-07-20 ENCOUNTER — Other Ambulatory Visit: Payer: Self-pay | Admitting: Physical Medicine & Rehabilitation

## 2020-07-20 ENCOUNTER — Other Ambulatory Visit: Payer: Self-pay | Admitting: Family Medicine

## 2020-07-20 DIAGNOSIS — G800 Spastic quadriplegic cerebral palsy: Secondary | ICD-10-CM

## 2020-08-18 ENCOUNTER — Other Ambulatory Visit: Payer: Self-pay | Admitting: Physical Medicine & Rehabilitation

## 2020-08-18 DIAGNOSIS — G800 Spastic quadriplegic cerebral palsy: Secondary | ICD-10-CM

## 2020-08-23 DIAGNOSIS — Z20828 Contact with and (suspected) exposure to other viral communicable diseases: Secondary | ICD-10-CM | POA: Diagnosis not present

## 2020-09-15 ENCOUNTER — Other Ambulatory Visit: Payer: Self-pay | Admitting: Family Medicine

## 2020-09-15 ENCOUNTER — Other Ambulatory Visit: Payer: Self-pay | Admitting: Physical Medicine & Rehabilitation

## 2020-09-15 DIAGNOSIS — G800 Spastic quadriplegic cerebral palsy: Secondary | ICD-10-CM

## 2020-09-15 NOTE — Telephone Encounter (Signed)
Ok to refill??  Last office visit 03/22/2020.  Last refill 05/19/2020, #2 refills.

## 2020-10-11 ENCOUNTER — Other Ambulatory Visit: Payer: Self-pay | Admitting: Physical Medicine & Rehabilitation

## 2020-10-11 DIAGNOSIS — G800 Spastic quadriplegic cerebral palsy: Secondary | ICD-10-CM

## 2020-11-04 ENCOUNTER — Encounter: Payer: Medicare Other | Admitting: Family Medicine

## 2020-11-14 ENCOUNTER — Encounter: Payer: Self-pay | Admitting: Family Medicine

## 2020-11-14 ENCOUNTER — Other Ambulatory Visit: Payer: Self-pay

## 2020-11-14 ENCOUNTER — Ambulatory Visit (INDEPENDENT_AMBULATORY_CARE_PROVIDER_SITE_OTHER): Payer: Medicare Other | Admitting: Family Medicine

## 2020-11-14 VITALS — BP 122/68 | HR 102 | Temp 98.1°F | Resp 18

## 2020-11-14 DIAGNOSIS — Z13220 Encounter for screening for lipoid disorders: Secondary | ICD-10-CM | POA: Diagnosis not present

## 2020-11-14 DIAGNOSIS — Z0000 Encounter for general adult medical examination without abnormal findings: Secondary | ICD-10-CM

## 2020-11-14 DIAGNOSIS — F250 Schizoaffective disorder, bipolar type: Secondary | ICD-10-CM | POA: Diagnosis not present

## 2020-11-14 DIAGNOSIS — G800 Spastic quadriplegic cerebral palsy: Secondary | ICD-10-CM | POA: Diagnosis not present

## 2020-11-14 DIAGNOSIS — Z136 Encounter for screening for cardiovascular disorders: Secondary | ICD-10-CM | POA: Diagnosis not present

## 2020-11-14 MED ORDER — FLUOCINOLONE ACETONIDE BODY 0.01 % EX OIL: 1.0000 "application " | TOPICAL_OIL | CUTANEOUS | 1 refills | Status: DC

## 2020-11-14 MED ORDER — DICLOFENAC SODIUM 1 % EX GEL: 2.0000 g | Freq: Four times a day (QID) | CUTANEOUS | 2 refills | Status: DC

## 2020-11-14 MED ORDER — IBUPROFEN 800 MG PO TABS: 800.0000 mg | ORAL_TABLET | Freq: Three times a day (TID) | ORAL | 0 refills | Status: DC | PRN

## 2020-11-14 NOTE — Progress Notes (Signed)
Subjective:    Patient ID: Nancy [name], female    DOB: 11-Jan-1991, 30 y.o.   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  SHe is wheelchair bound.  She has a history of DVT's, schizoaffective disorder, and MR.  She is here for CPE.  She had a Pap smear performed by gynecologist in 2020.  She is not due again until 2023.  There is no family history of breast cancer that would necessitate a mammogram before age 48.  Her mother died from colon cancer in her late 58s.  Therefore I suggested the [name] get screening for colon cancer at 43 however this is not due.  She denies any blood in her stool or abdominal pain.  She does request a refill on ibuprofen that she uses sparingly for joint pain and back pain.  She has significant scoliosis in the spine and occasionally complains of muscle pains in her back and in her arms from sitting in a wheelchair.  She uses this sparingly and is administered this by her caregiver.  She also uses Voltaren gel occasionally which would be much better for her stomach.  They are treating [name] for seborrheic dermatitis in the scalp.  Caregiver reports thick, flaky skin all along the scalp and around the hairline especially behind the ears and in the nasolabial folds.  They are using Nizoral shampoo 1-2 times a week however she continues to have scaly rash in her scalp.  She has very thick hair.  I do not appreciate any rash today although she does have occasional yellow scale seen through her hair. Past Medical History:  Diagnosis Date  . Anemia    h/o iron deficiency  . CP (cerebral palsy) (HCC)    with mild contracture  . IUD (intrauterine device) in place 10/2012  . Menorrhagia with regular cycle 11/11/2012  . Mobility impaired 11/11/2012  . Mood disorder (HCC)   . MR (mental retardation)   .  Peripheral vascular disease (HCC) 10/02/2006   bilateral DVT lower legs  . Seizures (HCC)    childhood  . Wheelchair bound    r/t CP   Past Surgical History:  Procedure Laterality Date  . EYE SURGERY     as a child  . HIP SURGERY Right 2003  . INTRAUTERINE DEVICE (IUD) INSERTION N/A 11/12/2012   Procedure: INTRAUTERINE DEVICE (IUD) INSERTION;  Surgeon: Sherron Monday, MD;  Location: [facility];  Service: Gynecology;  Laterality: N/A;  . INTRAUTERINE DEVICE (IUD) INSERTION N/A 10/23/2018   Procedure: INTRAUTERINE DEVICE (IUD) REPLACEMENT;  Surgeon: Sherian Rein, MD;  Location: [facility];  Service: Gynecology;  Laterality: N/A;  . LEG SURGERY     as a child  . MULTIPLE TOOTH EXTRACTIONS  10/18/10   Current Outpatient Medications on File Prior to Visit  Medication Sig Dispense Refill  . ARIPiprazole (ABILIFY) 5 MG tablet TAKE ONE TABLET BY MOUTH DAILY 30 tablet 11  . baclofen (LIORESAL) 20 MG tablet TAKE ONE TABLET BY MOUTH THREE TIMES A DAY 90 tablet 3  . clonazePAM (KLONOPIN) 1 MG tablet TAKE ONE TABLET BY MOUTH TWICE A DAY AS NEEDED FOR ANXIETY 60 tablet 2  . desonide (DESOWEN) 0.05 % cream Apply topically 2 (two) times daily. 30 g 0  . glycopyrrolate (ROBINUL) 1 MG tablet TAKE 1 TABLET (1 MG TOTAL) BY MOUTH DAILY.  30 tablet 4  . ketoconazole (NIZORAL) 2 % shampoo APPLY ONE APPLICATION TOPICALLY THREE TIMES WEEKLY 120 mL 0  . levonorgestrel (MIRENA) 20 MCG/24HR IUD 1 each by Intrauterine route once.    . ondansetron (ZOFRAN ODT) 4 MG disintegrating tablet Take 1 tablet (4 mg total) by mouth every 8 (eight) hours as needed for nausea or vomiting. 10 tablet 0  . sulfamethoxazole-trimethoprim (BACTRIM DS) 800-160 MG tablet Take 1 tablet by mouth 2 (two) times daily. 14 tablet 0   No current facility-administered medications on file prior to visit.   No Known Allergies Social History   Socioeconomic History  . Marital status: Single    Spouse name: Not on file  .  Number of children: Not on file  . Years of education: Not on file  . Highest education level: Not on file  Occupational History  . Not on file  Tobacco Use  . Smoking status: Never Smoker  . Smokeless tobacco: Never Used  Vaping Use  . Vaping Use: Never used  Substance and Sexual Activity  . Alcohol use: No    Comment: Pt denies  . Drug use: No    Comment: Pt denies  . Sexual activity: Never    Comment: lives in a group home.  single.  Other Topics Concern  . Not on file  Social History Narrative   Lives in an apartment with [facility] (assisted living)   Left handed    Social Determinants of Health   Financial Resource Strain: Not on file  Food Insecurity: Not on file  Transportation Needs: Not on file  Physical Activity: Not on file  Stress: Not on file  Social Connections: Not on file  Intimate Partner Violence: Not on file     Review of Systems  All other systems reviewed and are negative.       Review of Systems  All other systems reviewed and are negative.      Objective:   Physical Exam Vitals reviewed.  Constitutional:      General: She is not in acute distress.    Appearance: She is not ill-appearing, toxic-appearing or diaphoretic.  HENT:     Head: Normocephalic and atraumatic.     Right Ear: Tympanic membrane and ear canal normal.     Left Ear: External ear normal. There is impacted cerumen.     Nose: Nose normal. No congestion or rhinorrhea.     Mouth/Throat:     Mouth: Mucous membranes are moist.     Pharynx: Oropharynx is clear. No oropharyngeal exudate or posterior oropharyngeal erythema.  Eyes:     General: No scleral icterus.       Right eye: No discharge.        Left eye: No discharge.     Extraocular Movements: Extraocular movements intact.     Conjunctiva/sclera: Conjunctivae normal.     Pupils: Pupils are equal, round, and reactive to light.  Cardiovascular:     Rate and Rhythm: Normal rate and regular rhythm.     Pulses: Normal  pulses.     Heart sounds: Normal heart sounds. No murmur heard. No friction rub. No gallop.   Pulmonary:     Effort: Pulmonary effort is normal. No respiratory distress.     Breath sounds: Normal breath sounds. No stridor. No wheezing, rhonchi or rales.  Chest:     Chest wall: No tenderness.  Abdominal:     General: Abdomen is flat. Bowel sounds are normal. There is no distension.  Palpations: Abdomen is soft.     Tenderness: There is no abdominal tenderness. There is no guarding or rebound.     Hernia: No hernia is present.  Musculoskeletal:     Cervical back: Normal range of motion. No rigidity or tenderness.  Lymphadenopathy:     Cervical: No cervical adenopathy.  Neurological:     Mental Status: She is alert and oriented to person, place, and time. Mental status is at baseline.     Cranial Nerves: No cranial nerve deficit.     Coordination: Coordination abnormal.     Gait: Gait abnormal.     Deep Tendon Reflexes: Reflexes abnormal.  Psychiatric:        Mood and Affect: Mood normal.        Behavior: Behavior normal.        Thought Content: Thought content normal.        Judgment: Judgment normal.   Patient is wheelchair-bound.  She has contractures at the elbows of the upper extremities.  She has atrophy in the muscles in her thighs and in her calves.  She has spasticity in all 4 extremities.  Her caregiver help me to assess her back today.  Aside from significant scoliosis there is no visible pressure sore at her coccyx or along the sacrum.  I visualized as best I could her gluteus muscles and did not see any skin breakdown over the ischial tuberosities bilaterally      Assessment & Plan:  General medical exam - Plan: CBC with Differential/Platelet, COMPLETE METABOLIC PANEL WITH GFR, Lipid panel  CP (cerebral palsy), spastic, quadriplegic (HCC)  Schizoaffective disorder, bipolar type (HCC)  Physical exam today significant for the chronic findings of cerebral palsy and  spastic quadriplegia.  I will treat her scalp condition is seborrheic dermatitis with Nizoral shampoo 2 times a week and then Derma-Smoothe oil once a week as needed to control the irritation in the scale.  I will also check a CBC, CMP, lipid panel.  I refilled her ibuprofen but I encouraged her to use this sparingly to avoid GI toxicity and renal toxicity.  Instead I would rather her use Voltaren gel 2-4 times a day as needed as I feel that this will cause less side effects.  Her mood seems well controlled today.  There is no evidence of depression or delusions or paranoia or hallucinations.  She appears well taken care of by her caregiver.  She is had 2 COVID vaccines.  I recommended she get the third 1 in April when her 60-month timeframe has elapsed.

## 2020-11-15 ENCOUNTER — Encounter: Payer: Self-pay | Admitting: *Deleted

## 2020-11-15 LAB — CBC WITH DIFFERENTIAL/PLATELET
Absolute Monocytes: 502 cells/uL (ref 200–950)
Basophils Absolute: 41 cells/uL (ref 0–200)
Basophils Relative: 0.7 %
Eosinophils Absolute: 30 cells/uL (ref 15–500)
Eosinophils Relative: 0.5 %
HCT: 37.2 % (ref 35.0–45.0)
Hemoglobin: 11.8 g/dL (ref 11.7–15.5)
Lymphs Abs: 1510 cells/uL (ref 850–3900)
MCH: 28 pg (ref 27.0–33.0)
MCHC: 31.7 g/dL — ABNORMAL LOW (ref 32.0–36.0)
MCV: 88.4 fL (ref 80.0–100.0)
MPV: 10.1 fL (ref 7.5–12.5)
Monocytes Relative: 8.5 %
Neutro Abs: 3817 cells/uL (ref 1500–7800)
Neutrophils Relative %: 64.7 %
Platelets: 334 10*3/uL (ref 140–400)
RBC: 4.21 10*6/uL (ref 3.80–5.10)
RDW: 12.7 % (ref 11.0–15.0)
Total Lymphocyte: 25.6 %
WBC: 5.9 10*3/uL (ref 3.8–10.8)

## 2020-11-15 LAB — COMPLETE METABOLIC PANEL WITH GFR
AG Ratio: 1.3 (calc) (ref 1.0–2.5)
ALT: 10 U/L (ref 6–29)
AST: 14 U/L (ref 10–30)
Albumin: 4.1 g/dL (ref 3.6–5.1)
Alkaline phosphatase (APISO): 51 U/L (ref 31–125)
BUN: 8 mg/dL (ref 7–25)
CO2: 22 mmol/L (ref 20–32)
Calcium: 9.6 mg/dL (ref 8.6–10.2)
Chloride: 106 mmol/L (ref 98–110)
Creat: 0.52 mg/dL (ref 0.50–1.10)
GFR, Est African American: 148 mL/min/{1.73_m2} (ref >=60)
GFR, Est Non African American: 127 mL/min/{1.73_m2} (ref >=60)
Globulin: 3.1 g/dL (calc) (ref 1.9–3.7)
Glucose, Bld: 90 mg/dL (ref 65–99)
Potassium: 3.8 mmol/L (ref 3.5–5.3)
Sodium: 141 mmol/L (ref 135–146)
Total Bilirubin: 0.4 mg/dL (ref 0.2–1.2)
Total Protein: 7.2 g/dL (ref 6.1–8.1)

## 2020-11-15 LAB — LIPID PANEL
Cholesterol: 123 mg/dL (ref <200)
HDL: 48 mg/dL — ABNORMAL LOW (ref >=50)
LDL Cholesterol (Calc): 65 mg/dL (calc)
Non-HDL Cholesterol (Calc): 75 mg/dL (calc) (ref <130)
Total CHOL/HDL Ratio: 2.6 (calc) (ref <5.0)
Triglycerides: 35 mg/dL (ref <150)

## 2020-11-23 ENCOUNTER — Telehealth: Payer: Self-pay | Admitting: *Deleted

## 2020-11-23 NOTE — Telephone Encounter (Signed)
Received request from pharmacy for PA on Diclofenac Gel 1%.  PA submitted.   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 [employer] will review the request and will issue a decision, typically within 1-3 days from your submission. You can check the updated outcome later by reopening this request.  If [employer] has not responded in 1-3 days or if you have any questions about your ePA request, please contact [employer] at [phone]. If you think there may be a problem with your PA request, use our live chat feature at the bottom right.

## 2020-11-23 NOTE — Telephone Encounter (Signed)
PA approved 08/27/2020 - 11/23/2021.

## 2020-11-25 ENCOUNTER — Other Ambulatory Visit: Payer: Self-pay | Admitting: Family Medicine

## 2020-11-25 DIAGNOSIS — Z23 Encounter for immunization: Secondary | ICD-10-CM | POA: Diagnosis not present

## 2020-12-12 ENCOUNTER — Other Ambulatory Visit: Payer: Self-pay | Admitting: Family Medicine

## 2020-12-12 NOTE — Telephone Encounter (Signed)
Ok to refill??  Last office visit 11/14/2020.  Last refill on Klonopin 09/16/2020, #2 refills.

## 2020-12-21 ENCOUNTER — Other Ambulatory Visit: Payer: Self-pay

## 2020-12-21 ENCOUNTER — Encounter: Payer: Medicare Other | Attending: Physical Medicine & Rehabilitation | Admitting: Physical Medicine & Rehabilitation

## 2020-12-21 ENCOUNTER — Encounter: Payer: Self-pay | Admitting: Physical Medicine & Rehabilitation

## 2020-12-21 ENCOUNTER — Other Ambulatory Visit: Payer: Self-pay | Admitting: Family Medicine

## 2020-12-21 VITALS — BP 117/73 | HR 129 | Temp 98.9°F

## 2020-12-21 DIAGNOSIS — G800 Spastic quadriplegic cerebral palsy: Secondary | ICD-10-CM | POA: Diagnosis not present

## 2020-12-21 MED ORDER — BACLOFEN 20 MG PO TABS: 20.0000 mg | ORAL_TABLET | Freq: Three times a day (TID) | ORAL | 6 refills | Status: DC

## 2020-12-21 NOTE — Patient Instructions (Addendum)
PLEASE FEEL FREE TO CALL OUR OFFICE WITH ANY PROBLEMS OR QUESTIONS [phone])   IF YOU WANT TO INCREASE THE BACLOFEN FURTHER, WE CAN GO TO 20MG  4 X DAILY.

## 2020-12-21 NOTE — Progress Notes (Signed)
Subjective:    Patient ID: Nancy [name], female    DOB: [birth year], 30 y.o.   MRN: [id number] Need refill on Baclofen  HPI Nancy [name] is here in follow-up of her cerebral palsy and associated spasticity.  I last saw her in May 2021 when we did Botox injections to her hamstrings. She says they helped to a degree but prefers to be on baclofen. She feels that it prevents spasms although her knees remain tight. She is able to tolerate the medicine without any significant sedation. She is stretched daily by her caregivers.   She denies any new bowel, bladder, or skin issues. She denies sob, cough. Sleep is pretty regular.   Pain Inventory Average Pain 0 Pain Right Now 0 My pain is na  LOCATION OF PAIN  na  BOWEL Number of stools per week: 7 Oral laxative use No  Type of laxative na Enema or suppository use No  History of colostomy No  Incontinent No   BLADDER Normal In and out cath, frequency na Able to self cath No  Bladder incontinence No  Frequent urination No  Leakage with coughing No  Difficulty starting stream No  Incomplete bladder emptying No    Mobility ability to climb steps?  no do you drive?  no use a wheelchair needs help with transfers  Function disabled: date disabled . I need assistance with the following:  dressing, bathing, toileting, meal prep, household duties and shopping  Neuro/Psych No problems in this area  Prior Studies Any changes since last visit?  no  Physicians involved in your care Any changes since last visit?  no   Family History  Problem Relation Age of Onset  . Cancer Mother   . Diabetes Father   . Alzheimer's disease Maternal Grandfather   . Heart attack Paternal Grandmother        Died between 5 or 12 years old   Social History   Socioeconomic History  . Marital status: Single    Spouse name: Not on file  . Number of children: Not on file  . Years of education: Not on file  . Highest education level: Not on  file  Occupational History  . Not on file  Tobacco Use  . Smoking status: Never Smoker  . Smokeless tobacco: Never Used  Vaping Use  . Vaping Use: Never used  Substance and Sexual Activity  . Alcohol use: No    Comment: Pt denies  . Drug use: No    Comment: Pt denies  . Sexual activity: Never    Comment: lives in a group home.  single.  Other Topics Concern  . Not on file  Social History Narrative   Lives in an apartment with [facility] (assisted living)   Left handed    Social Determinants of Health   Financial Resource Strain: Not on file  Food Insecurity: Not on file  Transportation Needs: Not on file  Physical Activity: Not on file  Stress: Not on file  Social Connections: Not on file   Past Surgical History:  Procedure Laterality Date  . EYE SURGERY     as a child  . HIP SURGERY Right 2003  . INTRAUTERINE DEVICE (IUD) INSERTION N/A 11/12/2012   Procedure: INTRAUTERINE DEVICE (IUD) INSERTION;  Surgeon: Sherron Monday, MD;  Location: [facility];  Service: Gynecology;  Laterality: N/A;  . INTRAUTERINE DEVICE (IUD) INSERTION N/A 10/23/2018   Procedure: INTRAUTERINE DEVICE (IUD) REPLACEMENT;  Surgeon: Sherian Rein, MD;  Location: [facility];  Service: Gynecology;  Laterality: N/A;  . LEG SURGERY     as a child  . MULTIPLE TOOTH EXTRACTIONS  10/18/10   Past Medical History:  Diagnosis Date  . Anemia    h/o iron deficiency  . CP (cerebral palsy) (HCC)    with mild contracture  . IUD (intrauterine device) in place 10/2012  . Menorrhagia with regular cycle 11/11/2012  . Mobility impaired 11/11/2012  . Mood disorder (HCC)   . MR (mental retardation)   . Peripheral vascular disease (HCC) 10/02/2006   bilateral DVT lower legs  . Seizures (HCC)    childhood  . Wheelchair bound    r/t CP   BP 117/73   Pulse (!) 129   Temp 98.9 F (37.2 C)   Opioid Risk Score:   Fall Risk Score:  `1  Depression screen PHQ 2/9  Depression screen [facility], [facility] 2/9 11/14/2020  09/24/2018 08/26/2018 11/28/2016  Decreased Interest 1 1 1  0  Down, Depressed, Hopeless 2 1 1  0  PHQ - 2 Score 3 2 2  0  Altered sleeping - - 1 0  Tired, decreased energy 2 - 2 0  Change in appetite 0 - 0 0  Feeling bad or failure about yourself  1 - 1 0  Trouble concentrating 3 - 3 0  Moving slowly or fidgety/restless 0 - 1 0  Suicidal thoughts 0 - 0 0  PHQ-9 Score - - 10 0  Difficult doing work/chores Very difficult - - Not difficult at all    Review of Systems  Musculoskeletal:       Spasms   All other systems reviewed and are negative.      Objective:   Physical Exam General: No acute distress HEENT: EOMI, oral membranes moist Cards: reg rate  Chest: normal effort Abdomen: Soft, NT, ND Skin: dry, intact Extremities: no edema Psych: pleasant and appropriate  Neuro: alert and orientd x3. dysarthric   She has 3 out of 5 strength in the right upper extremity, left   4/5. .  right shoulder without tone but elbow contracted to around 90 degrees.  Biceps 2/4. Tr/4 right wrist.  .    Hip adductors 1-2/4 and hamstrings are 3/4 bilaterally. gastrocs 1-2/4.  hamstring tendon contractures.   Reflexes are 3+ throughout.  Musculoskeletal: She has significant dextroscoliosis of the thoracolumbar spine.  wc fiting appropriatelly.             Assessment & Plan:  1.  Cerebral palsy with spastic tetraplegia 2.  Severe dextroscoliosis of the thoracolumbar spine 3.  History of seizures 4.  History of psychosis/mood disorder which appears to be well compensated at present     Plan: 1.   Continue baclofen 20 mg 3 times daily. Discussed possibility of increasing further to qid. She will think about it. Unfortunately a lot of her tightness is contractures in lower exts 2.  HEP, stretching at home 3. Baclofen pump has been discussed previously.    Fifteen minutes of face to face patient care time were spent during this visit. All questions were encouraged and answered.  Follow up with me in  1 year .

## 2021-02-24 ENCOUNTER — Other Ambulatory Visit: Payer: Self-pay | Admitting: Family Medicine

## 2021-02-24 NOTE — Telephone Encounter (Signed)
PDMP reviewed.  Appears patient takes this medication chronically.  Refill given in place of PCP who is out of the office. 

## 2021-03-17 ENCOUNTER — Other Ambulatory Visit: Payer: Self-pay

## 2021-04-18 ENCOUNTER — Telehealth: Payer: Self-pay | Admitting: Family Medicine

## 2021-04-18 ENCOUNTER — Other Ambulatory Visit: Payer: Self-pay

## 2021-04-18 DIAGNOSIS — M170 Bilateral primary osteoarthritis of knee: Secondary | ICD-10-CM

## 2021-04-18 MED ORDER — DICLOFENAC SODIUM 1 % EX GEL: CUTANEOUS | 1 refills | Status: DC

## 2021-04-18 NOTE — Telephone Encounter (Signed)
Sent!

## 2021-04-18 NOTE — Telephone Encounter (Signed)
Patient called to request refiil of diclofenac Sodium (VOLTAREN) 1 % GEL [[id number]]  Pharmacy confirmed as   [facility] - [place], [place] - [address] [facility]  [address] [place], [place] [address]  Phone:  [phone]  Fax:  [phone]    Please advise at [phone] , or [phone]

## 2021-04-19 ENCOUNTER — Other Ambulatory Visit: Payer: Self-pay | Admitting: Family Medicine

## 2021-05-12 ENCOUNTER — Other Ambulatory Visit: Payer: Self-pay | Admitting: Family Medicine

## 2021-05-12 NOTE — Telephone Encounter (Signed)
Patient requesting Rx be sent to   [facility] [place], [place] - [address] [facility]  [address] [place], [place] [address]  Phone:  [phone]  Fax:  [phone]   Please advise at [phone]

## 2021-05-12 NOTE — Telephone Encounter (Signed)
Is this okay to refill ?  Last fill date 10/25/20  Last ov 11/14/20 Dr Tanya Nones

## 2021-05-29 ENCOUNTER — Telehealth: Payer: Self-pay

## 2021-05-29 NOTE — Telephone Encounter (Signed)
Pt called in requesting a refill of desonide (DESOWEN) 0.05 % cream.    Cb#: [phone]

## 2021-05-30 MED ORDER — DESONIDE 0.05 % EX CREA: TOPICAL_CREAM | Freq: Two times a day (BID) | CUTANEOUS | 0 refills | Status: AC

## 2021-05-30 NOTE — Telephone Encounter (Signed)
Prescription sent to pharmacy.

## 2021-06-01 ENCOUNTER — Other Ambulatory Visit: Payer: Self-pay

## 2021-06-01 ENCOUNTER — Telehealth: Payer: Self-pay | Admitting: Family Medicine

## 2021-06-01 ENCOUNTER — Ambulatory Visit (INDEPENDENT_AMBULATORY_CARE_PROVIDER_SITE_OTHER): Payer: Medicare Other

## 2021-06-01 VITALS — Ht 61.0 in | Wt 100.0 lb

## 2021-06-01 DIAGNOSIS — G800 Spastic quadriplegic cerebral palsy: Secondary | ICD-10-CM | POA: Diagnosis not present

## 2021-06-01 DIAGNOSIS — M170 Bilateral primary osteoarthritis of knee: Secondary | ICD-10-CM

## 2021-06-01 DIAGNOSIS — H539 Unspecified visual disturbance: Secondary | ICD-10-CM | POA: Diagnosis not present

## 2021-06-01 DIAGNOSIS — Z0000 Encounter for general adult medical examination without abnormal findings: Secondary | ICD-10-CM

## 2021-06-01 MED ORDER — DICLOFENAC SODIUM 1 % EX GEL: CUTANEOUS | 1 refills | Status: DC

## 2021-06-01 NOTE — Progress Notes (Addendum)
Subjective:   Nancy [name] is a 30 y.o. female who presents for an Initial Medicare Annual Wellness Visit. Virtual Visit via Telephone Note  I connected with  Nancy [name] on 06/01/21 at  3:30 PM EDT by telephone and verified that I am speaking with the correct person using two identifiers.  Location: Patient: home  Provider: [facility] Persons participating in the virtual visit: patient/Nurse Health Advisor   I discussed the limitations, risks, security and privacy concerns of performing an evaluation and management service by telephone and the availability of in person appointments. The patient expressed understanding and agreed to proceed.  Interactive audio and video telecommunications were attempted between this nurse and patient, however failed, due to patient having technical difficulties OR patient did not have access to video capability.  We continued and completed visit with audio only.  Some vital signs may be absent or patient reported.   Nancy Driver, LPN  Review of Systems     Cardiac Risk Factors include: sedentary lifestyle;Other (see comment), Risk factor comments: Cerebral Palsy and Epilepsy     Objective:    Today's Vitals   06/01/21 1535  Weight: 100 lb (45.4 kg)  Height: 5' 1"  (1.549 m)   Body mass index is 18.89 kg/m.  Advanced Directives 06/01/2021 10/23/2018 07/17/2018 08/09/2016 03/16/2016 12/04/2013 11/06/2012  Does Patient Have a Medical Advance Directive? No No Yes No No Patient does not have advance directive;Patient would not like information Patient does not have advance directive  Type of Advance Directive - - [place]  Would patient like information on creating a medical advance directive? No - Patient declined No - Patient declined - No - Patient declined - - -    Current Medications (verified) Outpatient Encounter Medications as of 06/01/2021  Medication Sig   ARIPiprazole (ABILIFY) 5 MG tablet TAKE  ONE TABLET BY MOUTH DAILY   baclofen (LIORESAL) 20 MG tablet Take 1 tablet (20 mg total) by mouth 3 (three) times daily.   clonazePAM (KLONOPIN) 1 MG tablet TAKE ONE TABLET BY MOUTH TWICE A DAY AS NEEDED FOR ANXIETY   desonide (DESOWEN) 0.05 % cream Apply topically 2 (two) times daily.   diclofenac Sodium (VOLTAREN) 1 % GEL APPLY TWO GRAMS TOPICALLY FOUR TIMES A DAY   Fluocinolone Acetonide Body 0.01 % OIL APPLY 1 APPLICATION TOPICALLY ONCE A WEEK   glycopyrrolate (ROBINUL) 1 MG tablet TAKE ONE TABLET BY MOUTH DAILY   ibuprofen (ADVIL) 800 MG tablet TAKE ONE TABLET BY MOUTH EVERY 8 HOURS AS NEEDED   ketoconazole (NIZORAL) 2 % shampoo APPLY ONE APPLICATION TOPICALLY THREE TIMES WEEKLY   levonorgestrel (MIRENA) 20 MCG/24HR IUD 1 each by Intrauterine route once.   ondansetron (ZOFRAN ODT) 4 MG disintegrating tablet Take 1 tablet (4 mg total) by mouth every 8 (eight) hours as needed for nausea or vomiting.   No facility-administered encounter medications on file as of 06/01/2021.    Allergies (verified) Patient has no known allergies.   History: Past Medical History:  Diagnosis Date   Anemia    h/o iron deficiency   CP (cerebral palsy) (HCC)    with mild contracture   IUD (intrauterine device) in place 10/2012   Menorrhagia with regular cycle 11/11/2012   Mobility impaired 11/11/2012   Mood disorder ([place])    MR (mental retardation)    Peripheral vascular disease ([place]) 10/02/2006   bilateral DVT lower legs   Seizures ([place])    childhood  Wheelchair bound    r/t CP   Past Surgical History:  Procedure Laterality Date   EYE SURGERY     as a child   HIP SURGERY Right 2003   INTRAUTERINE DEVICE (IUD) INSERTION N/A 11/12/2012   Procedure: INTRAUTERINE DEVICE (IUD) INSERTION;  Surgeon: Thornell Sartorius, MD;  Location: [facility];  Service: Gynecology;  Laterality: N/A;   INTRAUTERINE DEVICE (IUD) INSERTION N/A 10/23/2018   Procedure: INTRAUTERINE DEVICE (IUD) REPLACEMENT;  Surgeon: Janyth Contes, MD;  Location: [facility];  Service: Gynecology;  Laterality: N/A;   LEG SURGERY     as a child   MULTIPLE TOOTH EXTRACTIONS  10/18/10   Family History  Problem Relation Age of Onset   Cancer Mother    Diabetes Father    Alzheimer's disease Maternal Grandfather    Heart attack Paternal Grandmother        Died between 55 or 46 years old   Social History   Socioeconomic History   Marital status: Single    Spouse name: Not on file   Number of children: Not on file   Years of education: Not on file   Highest education level: Not on file  Occupational History   Not on file  Tobacco Use   Smoking status: Never   Smokeless tobacco: Never  Vaping Use   Vaping Use: Never used  Substance and Sexual Activity   Alcohol use: No    Comment: Pt denies   Drug use: No    Comment: Pt denies   Sexual activity: Never    Comment: lives in a group home.  single.  Other Topics Concern   Not on file  Social History Narrative   Lives in an apartment with [facility] (assisted living)   Left handed    Social Determinants of Health   Financial Resource Strain: Low Risk    Difficulty of Paying Living Expenses: Not hard at all  Food Insecurity: No Food Insecurity   Worried About Charity fundraiser in the Last Year: Never true   Arboriculturist in the Last Year: Never true  Transportation Needs: No Transportation Needs   Lack of Transportation (Medical): No   Lack of Transportation (Non-Medical): No  Physical Activity: Inactive   Days of Exercise per Week: 0 days   Minutes of Exercise per Session: 0 min  Stress: No Stress Concern Present   Feeling of Stress : Not at all  Social Connections: Moderately Integrated   Frequency of Communication with Friends and Family: More than three times a week   Frequency of Social Gatherings with Friends and Family: More than three times a week   Attends Religious Services: More than 4 times per year   Active Member of [employer] or  Organizations: Yes   Attends Music therapist: More than 4 times per year   Marital Status: Never married    Tobacco Counseling Counseling given: Not Answered   Clinical Intake:  Pre-visit preparation completed: No  Pain : No/denies pain     BMI - recorded: 18.89 Nutritional Status: BMI <19  Underweight Nutritional Risks: None Diabetes: No  How often do you need to have someone help you when you read instructions, pamphlets, or other written materials from your doctor or pharmacy?: 1 - Never  Diabetic?no  Interpreter Needed?: No  Information entered by :: MJ Juvia Aerts, LPN   Activities of Daily Living In your present state of health, do you have any difficulty performing the  following activities: 06/01/2021  Hearing? N  Vision? N  Difficulty concentrating or making decisions? N  Walking or climbing stairs? Y  Dressing or bathing? Y  Doing errands, shopping? Y  Preparing Food and eating ? Y  Using the Toilet? Y  In the past six months, have you accidently leaked urine? N  Do you have problems with loss of bowel control? N  Managing your Medications? Y  Managing your Finances? Y  Housekeeping or managing your Housekeeping? Y  Some recent data might be hidden    Patient Care Team: Susy Frizzle, MD as PCP - General (Family Medicine)  Indicate any recent Medical Services you may have received from other than [facility] providers in the past year (date may be approximate).     Assessment:   This is a routine wellness examination for [name].  Hearing/Vision screen No results found.  Dietary issues and exercise activities discussed: Current Exercise Habits: The patient does not participate in regular exercise at present, Exercise limited by: neurologic condition(s)   Goals Addressed             This Visit's Progress    Exercise 3x per week (30 min per time)       Pt would like to start PT back again. Pt also starting back to school in Jan  2023.       Depression Screen PHQ 2/9 Scores 06/01/2021 11/14/2020 09/24/2018 08/26/2018 11/28/2016  PHQ - 2 Score 0 3 2 2  0  PHQ- 9 Score - - - 10 0    Fall Risk Fall Risk  06/01/2021 12/21/2020 11/14/2020 10/28/2019 09/24/2018  Falls in the past year? 0 0 Exclusion - non ambulatory 0 0  Number falls in past yr: 0 - - - -  Injury with Fall? 0 - - - -  Risk for fall due to : Impaired mobility;Impaired vision - History of fall(s) - -  Follow up Falls prevention discussed - - - -    FALL RISK PREVENTION PERTAINING TO THE HOME:  Any stairs in or around the home? No  If so, are there any without handrails? No  Home free of loose throw rugs in walkways, pet beds, electrical cords, etc? No  Adequate lighting in your home to reduce risk of falls? No   ASSISTIVE DEVICES UTILIZED TO PREVENT FALLS:  Life alert? No  Use of a cane, walker or w/c? Yes  Grab bars in the bathroom? Yes  Shower chair or bench in shower? No  Walk in shower, pt can roll wheelchair into shower. Elevated toilet seat or a handicapped toilet? Yes   TIMED UP AND GO:  Was the test performed? No . Phone visit.    Cognitive Function:     6CIT Screen 06/01/2021  What Year? 0 points  What month? 0 points  What time? 0 points  Count back from 20 0 points  Months in reverse 4 points  Repeat phrase 4 points  Total Score 8    Immunizations Immunization History  Administered Date(s) Administered   DTaP 12/04/1989, 02/18/1990, 04/24/1990, 03/13/1991, 08/31/1993   Hepatitis B 07/02/1991, 09/09/1991, 08/25/1992, 06/12/2001   HiB (PRP-OMP) 12/04/1989, 02/18/1990, 04/24/1990, 12/09/1990   IPV 12/04/1989, 02/18/1990, 03/13/1991, 08/31/1993   Influenza,inj,Quad PF,6+ Mos 05/05/2013, 08/03/2014, 05/27/2015, 07/06/2016, 05/26/2018, 05/19/2019   Influenza-Unspecified 09/26/2012   MMR 09/05/1989, 12/09/1990   PFIZER(Purple Top)SARS-COV-2 Vaccination 05/04/2020, 05/25/2020   Td 12/01/2001   Tdap 12/01/2001    TDAP status:  Due, Education has been provided regarding the  importance of this vaccine. Advised may receive this vaccine at local pharmacy or Health Dept. Aware to provide a copy of the vaccination record if obtained from local pharmacy or Health Dept. Verbalized acceptance and understanding.  Flu Vaccine status: Due, Education has been provided regarding the importance of this vaccine. Advised may receive this vaccine at local pharmacy or Health Dept. Aware to provide a copy of the vaccination record if obtained from local pharmacy or Health Dept. Verbalized acceptance and understanding.  Pneumococcal vaccine status: Up to date  Covid-19 vaccine status: Information provided on how to obtain vaccines.   Qualifies for Shingles Vaccine? No   Zostavax completed No   Shingrix Completed?: No.    Education has been provided regarding the importance of this vaccine. Patient has been advised to call insurance company to determine out of pocket expense if they have not yet received this vaccine. Advised may also receive vaccine at local pharmacy or Health Dept. Verbalized acceptance and understanding.  Screening Tests Health Maintenance  Topic Date Due   Hepatitis C Screening  Never done   TETANUS/TDAP  12/02/2011   COVID-19 Vaccine (3 - Booster for Pfizer series) 10/24/2020   INFLUENZA VACCINE  03/27/2021   HIV Screening  11/21/2035 (Originally 08/22/2004)   PAP SMEAR-Modifier  10/23/2021   HPV VACCINES  Aged Out    Health Maintenance  Health Maintenance Due  Topic Date Due   Hepatitis C Screening  Never done   TETANUS/TDAP  12/02/2011   COVID-19 Vaccine (3 - Booster for Pfizer series) 10/24/2020   INFLUENZA VACCINE  03/27/2021    Colorectal cancer screening: No longer required.  Not required due to age.   Mammogram status: No longer required due to age.  Bone Density status: Ordered Not required until age 15. Marland Kitchen Pt provided with contact info and advised to call to schedule appt.  Lung Cancer  Screening: (Low Dose CT Chest recommended if Age 16-80 years, 30 pack-year currently smoking OR have quit w/in 15years.) does not qualify.    Additional Screening:  Hepatitis C Screening: does not qualify  Vision Screening: Recommended annual ophthalmology exams for early detection of glaucoma and other disorders of the eye. Is the patient up to date with their annual eye exam?  No  Who is the provider or what is the name of the office in which the patient attends annual eye exams? Does not have an eye doctor. If pt is not established with a provider, would they like to be referred to a provider to establish care? Yes .   Dental Screening: Recommended annual dental exams for proper oral hygiene  Community Resource Referral / Chronic Care Management: CRR required this visit?  No   CCM required this visit?  No      Plan:     I have personally reviewed and noted the following in the patient's chart:   Medical and social history Use of alcohol, tobacco or illicit drugs  Current medications and supplements including opioid prescriptions. Patient is not currently taking opioid prescriptions. Functional ability and status Nutritional status Physical activity Advanced directives List of other physicians Hospitalizations, surgeries, and ER visits in previous 12 months Vitals Screenings to include cognitive, depression, and falls Referrals and appointments  In addition, I have reviewed and discussed with patient certain preventive protocols, quality metrics, and best practice recommendations. A written personalized care plan for preventive services as well as general preventive health recommendations were provided to patient.     Doyle Askew  Langston Masker, [place]   [id number]   Nurse Notes: Pt doing well. Would like referral to [facility]. Pt would also like a referral for PT. Orders placed.  I have collaborated with the care management provider regarding care management and care coordination  activities outlined in this encounter and have reviewed this encounter including documentation in the note and care plan. I am certifying that I agree with the content of this note and encounter as supervising physician.

## 2021-06-01 NOTE — Telephone Encounter (Signed)
Patient requesting refill of diclofenac Sodium (VOLTAREN) 1 % GEL [[id number]]   Pharmacy confirmed as  [facility] - [place], [place] - [address]  [address] [place] [address]  Phone:  [phone]  Fax:  [phone]   Requesting dose be lowered to TID; patient states it's difficult to apply it 4 times a day.   Also requesting referrals for PT and to see eye the doctor.   Please advise at [phone].

## 2021-06-01 NOTE — Patient Instructions (Signed)
[name] , Thank you for taking time to come for your Medicare Wellness Visit. I appreciate your ongoing commitment to your health goals. Please review the following plan we discussed and let me know if I can assist you in the future.   Screening recommendations/referrals: Colonoscopy: Not required due to age. Mammogram: Not required due to age.  Bone Density: Not required due to age.  Recommended yearly ophthalmology/optometry visit for glaucoma screening and checkup-referral sent 06/01/21 Recommended yearly dental visit for hygiene and checkup  Vaccinations: Influenza vaccine: Done 05/19/2019 Repeat annually  Pneumococcal vaccine: Not required due to age.  Tdap vaccine: Done 12/01/2001 Repeat in 10 years  Shingles vaccine: Not required due to age.   Covid-19: Done 05/04/2020 and 05/25/2020  Advanced directives: Advance directive discussed with you today. Even though you declined this today, please call our office should you change your mind, and we can give you the proper paperwork for you to fill out.   Conditions/risks identified: Aim for 30 minutes of exercise each day, drink 6-8 glasses of water and eat lots of fruits and vegetables.   Next appointment: Follow up in one year for your annual wellness visit. 2023.  Preventive Care 40-64 Years, Female Preventive care refers to lifestyle choices and visits with your health care provider that can promote health and wellness. What does preventive care include? A yearly physical exam. This is also called an annual well check. Dental exams once or twice a year. Routine eye exams. Ask your health care provider how often you should have your eyes checked. Personal lifestyle choices, including: Daily care of your teeth and gums. Regular physical activity. Eating a healthy diet. Avoiding tobacco and drug use. Limiting alcohol use. Practicing safe sex. Taking low-dose aspirin daily starting at age 35. Taking vitamin and mineral  supplements as recommended by your health care provider. What happens during an annual well check? The services and screenings done by your health care provider during your annual well check will depend on your age, overall health, lifestyle risk factors, and family history of disease. Counseling  Your health care provider may ask you questions about your: Alcohol use. Tobacco use. Drug use. Emotional well-being. Home and relationship well-being. Sexual activity. Eating habits. Work and work Statistician. Method of birth control. Menstrual cycle. Pregnancy history. Screening  You may have the following tests or measurements: Height, weight, and BMI. Blood pressure. Lipid and cholesterol levels. These may be checked every 5 years, or more frequently if you are over 27 years old. Skin check. Lung cancer screening. You may have this screening every year starting at age 17 if you have a 30-pack-year history of smoking and currently smoke or have quit within the past 15 years. Fecal occult blood test (FOBT) of the stool. You may have this test every year starting at age 56. Flexible sigmoidoscopy or colonoscopy. You may have a sigmoidoscopy every 5 years or a colonoscopy every 10 years starting at age 92. Hepatitis C blood test. Hepatitis B blood test. Sexually transmitted disease (STD) testing. Diabetes screening. This is done by checking your blood sugar (glucose) after you have not eaten for a while (fasting). You may have this done every 1-3 years. Mammogram. This may be done every 1-2 years. Talk to your health care provider about when you should start having regular mammograms. This may depend on whether you have a family history of breast cancer. BRCA-related cancer screening. This may be done if you have a family history of breast, ovarian,  tubal, or peritoneal cancers. Pelvic exam and Pap test. This may be done every 3 years starting at age 23. Starting at age 21, this may be done  every 5 years if you have a Pap test in combination with an HPV test. Bone density scan. This is done to screen for osteoporosis. You may have this scan if you are at high risk for osteoporosis. Discuss your test results, treatment options, and if necessary, the need for more tests with your health care provider. Vaccines  Your health care provider may recommend certain vaccines, such as: Influenza vaccine. This is recommended every year. Tetanus, diphtheria, and acellular pertussis (Tdap, Td) vaccine. You may need a Td booster every 10 years. Zoster vaccine. You may need this after age 9. Pneumococcal 13-valent conjugate (PCV13) vaccine. You may need this if you have certain conditions and were not previously vaccinated. Pneumococcal polysaccharide (PPSV23) vaccine. You may need one or two doses if you smoke cigarettes or if you have certain conditions. Talk to your health care provider about which screenings and vaccines you need and how often you need them. This information is not intended to replace advice given to you by your health care provider. Make sure you discuss any questions you have with your health care provider. Document Released: 09/09/2015 Document Revised: 05/02/2016 Document Reviewed: 06/14/2015 Elsevier Interactive Patient Education  2017 [place] Prevention in the Home Falls can cause injuries. They can happen to people of all ages. There are many things you can do to make your home safe and to help prevent falls. What can I do on the outside of my home? Regularly fix the edges of walkways and driveways and fix any cracks. Remove anything that might make you trip as you walk through a door, such as a raised step or threshold. Trim any bushes or trees on the path to your home. Use bright outdoor lighting. Clear any walking paths of anything that might make someone trip, such as rocks or tools. Regularly check to see if handrails are loose or broken. Make  sure that both sides of any steps have handrails. Any raised decks and porches should have guardrails on the edges. Have any leaves, snow, or ice cleared regularly. Use sand or salt on walking paths during winter. Clean up any spills in your garage right away. This includes oil or grease spills. What can I do in the bathroom? Use night lights. Install grab bars by the toilet and in the tub and shower. Do not use towel bars as grab bars. Use non-skid mats or decals in the tub or shower. If you need to sit down in the shower, use a plastic, non-slip stool. Keep the floor dry. Clean up any water that spills on the floor as soon as it happens. Remove soap buildup in the tub or shower regularly. Attach bath mats securely with double-sided non-slip rug tape. Do not have throw rugs and other things on the floor that can make you trip. What can I do in the bedroom? Use night lights. Make sure that you have a light by your bed that is easy to reach. Do not use any sheets or blankets that are too big for your bed. They should not hang down onto the floor. Have a firm chair that has side arms. You can use this for support while you get dressed. Do not have throw rugs and other things on the floor that can make you trip. What can I  do in the kitchen? Clean up any spills right away. Avoid walking on wet floors. Keep items that you use a lot in easy-to-reach places. If you need to reach something above you, use a strong step stool that has a grab bar. Keep electrical cords out of the way. Do not use floor polish or wax that makes floors slippery. If you must use wax, use non-skid floor wax. Do not have throw rugs and other things on the floor that can make you trip. What can I do with my stairs? Do not leave any items on the stairs. Make sure that there are handrails on both sides of the stairs and use them. Fix handrails that are broken or loose. Make sure that handrails are as long as the  stairways. Check any carpeting to make sure that it is firmly attached to the stairs. Fix any carpet that is loose or worn. Avoid having throw rugs at the top or bottom of the stairs. If you do have throw rugs, attach them to the floor with carpet tape. Make sure that you have a light switch at the top of the stairs and the bottom of the stairs. If you do not have them, ask someone to add them for you. What else can I do to help prevent falls? Wear shoes that: Do not have high heels. Have rubber bottoms. Are comfortable and fit you well. Are closed at the toe. Do not wear sandals. If you use a stepladder: Make sure that it is fully opened. Do not climb a closed stepladder. Make sure that both sides of the stepladder are locked into place. Ask someone to hold it for you, if possible. Clearly mark and make sure that you can see: Any grab bars or handrails. First and last steps. Where the edge of each step is. Use tools that help you move around (mobility aids) if they are needed. These include: Canes. Walkers. Scooters. Crutches. Turn on the lights when you go into a dark area. Replace any light bulbs as soon as they burn out. Set up your furniture so you have a clear path. Avoid moving your furniture around. If any of your floors are uneven, fix them. If there are any pets around you, be aware of where they are. Review your medicines with your doctor. Some medicines can make you feel dizzy. This can increase your chance of falling. Ask your doctor what other things that you can do to help prevent falls. This information is not intended to replace advice given to you by your health care provider. Make sure you discuss any questions you have with your health care provider. Document Released: 06/09/2009 Document Revised: 01/19/2016 Document Reviewed: 09/17/2014 Elsevier Interactive Patient Education  2017 [employer].

## 2021-06-01 NOTE — Telephone Encounter (Signed)
Prescription sent to pharmacy.   Referral orders placed.  

## 2021-06-02 ENCOUNTER — Telehealth: Payer: Self-pay | Admitting: Family Medicine

## 2021-06-02 NOTE — Telephone Encounter (Signed)
Patient called to ask if it's ok for her to use [facility] for her toothache. Please advise at [phone].

## 2021-06-02 NOTE — Telephone Encounter (Signed)
Since Orajel is topical, it should not interfere with any medications. It is safe to use.   Call placed to patient and patient caregiver made aware.

## 2021-06-05 ENCOUNTER — Telehealth: Payer: Self-pay | Admitting: Family Medicine

## 2021-06-05 DIAGNOSIS — M170 Bilateral primary osteoarthritis of knee: Secondary | ICD-10-CM

## 2021-06-05 MED ORDER — DICLOFENAC SODIUM 1 % EX GEL: CUTANEOUS | 1 refills | Status: DC

## 2021-06-05 NOTE — Telephone Encounter (Signed)
Patient called about refill quantity received for diclofenac Sodium (VOLTAREN) 1 % GEL [[id number]]   Patient stated she only received 1 but needs 4; patient applies it BID. Please advise at [phone]

## 2021-06-12 ENCOUNTER — Other Ambulatory Visit: Payer: Self-pay

## 2021-06-12 MED ORDER — CLONAZEPAM 1 MG PO TABS
ORAL_TABLET | ORAL | 3 refills | Status: DC
Start: 2021-06-12 — End: 2021-09-22

## 2021-06-12 NOTE — Telephone Encounter (Signed)
Pt called in requesting a refill of clonazePAM (KLONOPIN) 1 MG .   Cb #: [phone]

## 2021-06-12 NOTE — Telephone Encounter (Signed)
Ok to refill??  Last office visit 06/05/2021  Last refill 02/24/2021.

## 2021-06-15 ENCOUNTER — Telehealth: Payer: Self-pay | Admitting: Family Medicine

## 2021-06-15 DIAGNOSIS — H539 Unspecified visual disturbance: Secondary | ICD-10-CM

## 2021-06-15 DIAGNOSIS — G800 Spastic quadriplegic cerebral palsy: Secondary | ICD-10-CM

## 2021-06-15 NOTE — Telephone Encounter (Signed)
Patient can be seen by Ophthalmology as she is already a patient there.

## 2021-06-15 NOTE — Telephone Encounter (Signed)
Received voicemail from [place] at [facility] are regarding referral; no optometrist on site. Complete eye exam can be done. If optometrist needed, referral should be made out to a different office. Please advise at [phone].

## 2021-06-19 NOTE — Telephone Encounter (Signed)
Referral orders placed

## 2021-06-28 ENCOUNTER — Other Ambulatory Visit: Payer: Self-pay

## 2021-06-28 ENCOUNTER — Ambulatory Visit: Payer: Medicare Other | Attending: Family Medicine

## 2021-06-28 DIAGNOSIS — R2689 Other abnormalities of gait and mobility: Secondary | ICD-10-CM | POA: Diagnosis not present

## 2021-06-28 DIAGNOSIS — R293 Abnormal posture: Secondary | ICD-10-CM | POA: Diagnosis not present

## 2021-06-28 DIAGNOSIS — G800 Spastic quadriplegic cerebral palsy: Secondary | ICD-10-CM | POA: Insufficient documentation

## 2021-06-28 NOTE — Therapy (Signed)
[facility] [address] [place], [place], [address] Phone: [phone]   Fax:  [phone]  Physical Therapy Evaluation  Patient Details  Name: Nancy [name] MRN: [id number] Date of Birth: 11-17-1990 Referring Provider (PT): Jenna Luo, MD   Encounter Date: 06/28/2021   PT End of Session - 06/28/21 1229     Visit Number 1    Number of Visits 1    Date for PT Re-Evaluation 06/29/21    Authorization Type Medicare A&B/MCD    PT Start Time 1230    PT Stop Time 1315    PT Time Calculation (min) 45 min    Equipment Utilized During Treatment Gait belt    Activity Tolerance Patient tolerated treatment well    Behavior During Therapy [facility] for tasks assessed/performed             Past Medical History:  Diagnosis Date   Anemia    h/o iron deficiency   CP (cerebral palsy) ([place])    with mild contracture   IUD (intrauterine device) in place 10/2012   Menorrhagia with regular cycle 11/11/2012   Mobility impaired 11/11/2012   Mood disorder ([place])    MR (mental retardation)    Peripheral vascular disease ([place]) 10/02/2006   bilateral DVT lower legs   Seizures ([place])    childhood   Wheelchair bound    r/t CP    Past Surgical History:  Procedure Laterality Date   EYE SURGERY     as a child   HIP SURGERY Right 2003   INTRAUTERINE DEVICE (IUD) INSERTION N/A 11/12/2012   Procedure: INTRAUTERINE DEVICE (IUD) INSERTION;  Surgeon: Thornell Sartorius, MD;  Location: [facility];  Service: Gynecology;  Laterality: N/A;   INTRAUTERINE DEVICE (IUD) INSERTION N/A 10/23/2018   Procedure: INTRAUTERINE DEVICE (IUD) REPLACEMENT;  Surgeon: Janyth Contes, MD;  Location: [facility];  Service: Gynecology;  Laterality: N/A;   LEG SURGERY     as a child   MULTIPLE TOOTH EXTRACTIONS  10/18/10    There were no vitals filed for this visit.    Subjective Assessment - 06/28/21 1234     Subjective Patient reports she  has been doing well since she was here in 2021 for w/c assessment. Patient present with ALF [name]). Patient report that her w/c still fits her well, no issues. No pain. Patient reports her ALF is picking her up and putting her in bed. Bed is not adjustable, and is interested in hospital bed. No falls or current concerns. ALF denies any issues with mobility/transfers at home. Has not been stretching regularly. On Baclofen but has not recieved Botox.    Patient is accompained by: --   ALF [name])   Pertinent History Cerebral Palsy, Mental Retardation, PVD, Seizures, Wheelchair Bound    Limitations Standing;Sitting    Patient Stated Goals get hospital bed    Currently in Pain? No/denies                [facility] PT Assessment - 06/28/21 0001       Assessment   Medical Diagnosis Cerebral Palsy/Spastic Quadriplegia    Referring Provider (PT) Jenna Luo, MD    Onset Date/Surgical Date 06/01/21    Hand Dominance Left    Prior Therapy Prior PT in 2019, W/C eval in 2021      Precautions   Precautions Fall    Precaution Comments sigificant tone noted, posterior lean      Restrictions   Weight  Bearing Restrictions No      Balance Screen   Has the patient fallen in the past 6 months No    Has the patient had a decrease in activity level because of a fear of falling?  No    Is the patient reluctant to leave their home because of a fear of falling?  No      Home Ecologist residence    Living Arrangements Other (Comment)   AFL   Available Help at Discharge Personal care attendant    Home Equipment Wheelchair - power;Tub bench    Additional Comments requesting need of hospital bed      Prior Function   Level of Independence Needs assistance with ADLs;Needs assistance with transfers      Cognition   Overall Cognitive Status Difficult to assess      Posture/Postural Control   Posture/Postural Control Postural limitations    Posture Comments  Continue to present with knees swept to the R side, hips to the L. Lateral trunk lean.      Tone   Assessment Location Right Lower Extremity;Left Lower Extremity      ROM / Strength   AROM / PROM / Strength PROM      PROM   Overall PROM  Deficits    Overall PROM Comments PROM limited in BLE, more notable in L > R due to tone.      Bed Mobility   Bed Mobility Supine to Sit;Sit to Supine    Supine to Sit Total Assistance - Patient < 25%    Sit to Supine Total Assistance - Patient < 25%      Transfers   Transfers Squat Pivot Transfers    Squat Pivot Transfers 1: +1 Total assist    Squat Pivot Transfer Details (indicate cue type and reason) transfer from power chair > mat with Total A. patient able to initiate anterior lean to faciliatate but PT provide total A for transfer.      Ambulation/Gait   Ambulation/Gait --   unable to ambulate     Balance   Balance Assessed Yes      Static Sitting Balance   Static Sitting - Balance Support Right upper extremity supported;Feet supported    Static Sitting - Level of Assistance 2: Max assist;3: Mod assist    Static Sitting - Comment/# of Minutes patient able to maintain for 15-20 seconds, but posterior lean noted require assistance from PT      RLE Tone   RLE Tone Modified Ashworth      RLE Tone   Modified Ashworth Scale for Grading Hypertonia RLE Considerable increase in muschle tone, passive movement difficult      LLE Tone   LLE Tone Modified Ashworth      LLE Tone   Modified Ashworth Scale for Grading Hypertonia LLE Affected part(s) rigid in flexion or extension              Objective measurements completed on examination: See above findings.       [facility] Adult PT Treatment/Exercise - 06/28/21 0001       Self-Care   Self-Care Other Self-Care Comments    Other Self-Care Comments  PT suggesting reaching out to Dr. Naaman Plummer and potential for medical management/botox to promote improvements in tone. Reviewed stretching HEP  with caregiver and handout provided.              PT Education - 06/28/21 1313     Education  Details Caregiver Stretches; PT to write LMN for equipment needs    Person(s) Educated Patient;Caregiver(s)    Methods Explanation;Demonstration;Handout    Comprehension Returned demonstration;Verbalized understanding               Plan - 06/28/21 1523     Clinical Impression Statement Patient is a 30 y.o. female referred to Neuro OPPT services for CP/Spastic Quadriplegia. Patient's PMH significant for the following: Cerebral Palsy, Mental Retardation, PVD, Seizures, Wheelchair Bound. Patient presents with chronic impairments. Patient continues to present with significant tone affecting mobility and ROM, total A with transfers, and impaired seated balance due to tone. PT educating on medical management for management of tone and stretching program. Reviewd stretching program with ALF. Pt has not presented with any significant decline, and patient/ALF provider decline significant challenges at home. No need for PT services at this time, with ALF and patient agreeable to management of stretching at home. PT will plan to write LMN for hospital bed.    Personal Factors and Comorbidities Comorbidity 3+;Time since onset of injury/illness/exacerbation    Comorbidities Cerebral Palsy, Mental Retardation, PVD, Seizures, Wheelchair Bound    Examination-Activity Limitations Bed Mobility;Sit;Transfers    Stability/Clinical Decision Making Stable/Uncomplicated    Clinical Decision Making Low    PT Frequency One time visit    PT Treatment/Interventions Therapeutic exercise;Therapeutic activities;Functional mobility training;Balance training;Neuromuscular re-education;Patient/family education;Wheelchair mobility training;Manual techniques;Passive range of motion    Consulted and Agree with Plan of Care Patient;Other (Comment)   ALF Lenise Arena)            Patient will benefit from skilled  therapeutic intervention in order to improve the following deficits and impairments:  Decreased balance, Decreased mobility, Hypomobility, Increased muscle spasms, Postural dysfunction  Visit Diagnosis: Other abnormalities of gait and mobility  Abnormal posture     Problem List Patient Active Problem List   Diagnosis Date Noted   SIRS (systemic inflammatory response syndrome) (HCC) 08/09/2016   Nausea vomiting and diarrhea 08/09/2016   Fecal impaction (HCC) 08/09/2016   Psychosis (HCC) 11/23/2013   Paranoia (HCC) 11/22/2013   Fall from wheelchair 05/29/2013   Anemia    CP (cerebral palsy), spastic, 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

## 2021-06-28 NOTE — Patient Instructions (Signed)
CAREGIVER ASSISTED: Adductors    Caregiver moves leg out to side, keeping toes pointed up. Hold _30__ seconds. _3__ reps per set, _1-2_ sets per day.  Copyright  VHI. All rights reserved.    CAREGIVER ASSISTED: Hamstrings - Supine    Caregiver holds leg at ankle and over knee; raises leg straight. Hold _30__ seconds. _3__ reps per set, _1-2__ sets per day. Copyright  VHI. All rights reserved.    Flexion (Assistive)    Clasp hands together and raise arms above head, keeping elbows as straight as possible. Do lying down**. Repeat __5-10__ times. Do __2__ sessions per day.  Copyright  VHI. All rights reserved.           Electronically signed by Berneice Heinrich, PT at 07/02/2018 11:46 AM      CAREGIVER ASSISTED: Hip / Knee Flexion    Caregiver holds leg at knee and heel; raises knee to chest. Hold __30_ seconds. _3__ reps per set, _1__ sets per day.

## 2021-07-10 ENCOUNTER — Other Ambulatory Visit: Payer: Self-pay | Admitting: Family Medicine

## 2021-07-24 ENCOUNTER — Telehealth: Payer: Self-pay | Admitting: Family Medicine

## 2021-07-24 NOTE — Telephone Encounter (Signed)
Patient states she needs to continue taking the white pill since it helps control drooling. Also requesting something to help with constipation; asked if she can take miralax.  Please advise at [phone].

## 2021-07-24 NOTE — Telephone Encounter (Signed)
Patient is requesting something to be called in for nausea. She states that she has just started to become nauseas after taking a "white pill". She couldn't tell me the name of it.  CB# [phone]

## 2021-07-24 NOTE — Telephone Encounter (Signed)
Spoke with pt again and explained I do not think it is Baclofen as this is a muscle relaxer and would not help control her drooling. Pt is still unsure which medication she has taken that made her nauseous. Asked if there was anyone with her who could assist in locating the pill bottle, pt states there is not. She will call us back tomorrow with more information.

## 2021-07-24 NOTE — Telephone Encounter (Signed)
Pt states she has been nauseous since taking "a white pill" this morning. Pt reports it is a pill she takes 3 time a day, per med list this could be Baclofen. Pt is not sure what the pill is and could not get the bottle to confirm. Advised pt to not take any more of this medication at this time.   Please advise, thanks!

## 2021-07-26 NOTE — Telephone Encounter (Signed)
Called patient and left message for call back.

## 2021-08-22 ENCOUNTER — Other Ambulatory Visit: Payer: Self-pay | Admitting: Physical Medicine & Rehabilitation

## 2021-08-22 ENCOUNTER — Other Ambulatory Visit: Payer: Self-pay | Admitting: Family Medicine

## 2021-08-22 DIAGNOSIS — G800 Spastic quadriplegic cerebral palsy: Secondary | ICD-10-CM

## 2021-09-14 ENCOUNTER — Telehealth: Payer: Self-pay

## 2021-09-14 NOTE — Telephone Encounter (Signed)
Spoke with caregiver and she is going to give patient miralax as directed to see if that helps.

## 2021-09-22 ENCOUNTER — Other Ambulatory Visit: Payer: Self-pay | Admitting: Family Medicine

## 2021-09-22 NOTE — Telephone Encounter (Signed)
LOV 11/14/20 Last refill: Clonazepam  06/12/21, #60, 3 refills Abilify  11/25/20, #30, 10 refills  Both appear to be early refill requests based on previous rxs.   Please review, thanks!

## 2021-10-16 ENCOUNTER — Other Ambulatory Visit: Payer: Self-pay | Admitting: Family Medicine

## 2021-10-16 DIAGNOSIS — M170 Bilateral primary osteoarthritis of knee: Secondary | ICD-10-CM

## 2021-11-09 DIAGNOSIS — H5203 Hypermetropia, bilateral: Secondary | ICD-10-CM | POA: Diagnosis not present

## 2021-11-09 DIAGNOSIS — H50112 Monocular exotropia, left eye: Secondary | ICD-10-CM | POA: Diagnosis not present

## 2021-11-17 ENCOUNTER — Other Ambulatory Visit: Payer: Self-pay | Admitting: Family Medicine

## 2021-11-24 ENCOUNTER — Telehealth: Payer: Self-pay

## 2021-11-24 NOTE — Telephone Encounter (Signed)
An nurse from [facility] called in stating that this pt who was referred to their office by pcp was a no show for her appt today. Please call with any questions. ? ?Cb#: [phone] ?

## 2021-11-24 NOTE — Telephone Encounter (Signed)
Noted  

## 2021-12-05 ENCOUNTER — Ambulatory Visit: Payer: Medicare Other | Admitting: Family Medicine

## 2021-12-14 ENCOUNTER — Other Ambulatory Visit: Payer: Self-pay | Admitting: Family Medicine

## 2021-12-14 DIAGNOSIS — M170 Bilateral primary osteoarthritis of knee: Secondary | ICD-10-CM

## 2021-12-14 NOTE — Telephone Encounter (Signed)
LOV 11/14/20 ?Last refill 09/22/21, #60, 2 refills ? ?Please review, thanks! ? ?

## 2021-12-27 ENCOUNTER — Encounter: Payer: Medicare Other | Attending: Physical Medicine & Rehabilitation | Admitting: Physical Medicine & Rehabilitation

## 2022-01-10 ENCOUNTER — Other Ambulatory Visit: Payer: Self-pay | Admitting: Family Medicine

## 2022-01-10 NOTE — Telephone Encounter (Signed)
Requested Prescriptions  ?Pending Prescriptions Disp Refills  ?? glycopyrrolate (ROBINUL) 1 MG tablet [Pharmacy Med Name: GLYCOPYRROLATE 1MG  TAB] 30 tablet 0  ?  Sig: TAKE ONE TABLET BY MOUTH DAILY *NEEDS APPT FOR FURTHER REFILLS*  ?  ? Gastroenterology:  Antispasmodic Agents Failed - 01/10/2022  1:12 PM  ?  ?  Failed - Valid encounter within last 12 months  ?  Recent Outpatient Visits   ?      ? 1 year ago General medical exam  ? [facility] Susy Frizzle, MD  ? 1 year ago Toe pain, left  ? [facility] Pickard, Cammie Mcgee, MD  ? 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 [facility] Pickard, Cammie Mcgee, MD  ? 2 years ago Essential tremor  ? [place] Pickard, Cammie Mcgee, MD  ? 3 years ago General medical exam  ? [facility] Susy Frizzle, MD  ?  ?  ? ?  ?  ?  ? ?

## 2022-01-31 DIAGNOSIS — H40053 Ocular hypertension, bilateral: Secondary | ICD-10-CM | POA: Diagnosis not present

## 2022-02-08 ENCOUNTER — Other Ambulatory Visit: Payer: Self-pay | Admitting: Family Medicine

## 2022-02-08 ENCOUNTER — Other Ambulatory Visit: Payer: Self-pay | Admitting: Physical Medicine & Rehabilitation

## 2022-02-08 DIAGNOSIS — G800 Spastic quadriplegic cerebral palsy: Secondary | ICD-10-CM

## 2022-02-08 NOTE — Telephone Encounter (Signed)
Requested medication (s) are due for refill today: yes  Requested medication (s) are on the active medication list: yes  /Last refill:  klonopin 12/14/21 #60/, robinul 01/10/22 #30/0  Future visit scheduled: yes  Notes to clinic:  Unable to refill per protocol, medication not assigned to the refill protocol.    Requested Prescriptions  Pending Prescriptions Disp Refills   clonazePAM (KLONOPIN) 1 MG tablet [Pharmacy Med Name: CLONAZEPAM 1MG  TAB] 60 tablet 0    Sig: TAKE ONE TABLET BY MOUTH TWICE A DAY AS NEEDED FOR ANXIETY     There is no refill protocol information for this order     glycopyrrolate (ROBINUL) 1 MG tablet [Pharmacy Med Name: GLYCOPYRROLATE 1MG  TAB] 30 tablet 0    Sig: TAKE ONE TABLET BY MOUTH DAILY *NEEDS APPT FOR FURTHER REFILLS*     There is no refill protocol information for this order

## 2022-02-09 ENCOUNTER — Encounter: Payer: Self-pay | Admitting: Family Medicine

## 2022-02-12 NOTE — Telephone Encounter (Signed)
LOV 11/14/20 Last refill 12/14/21, #60, 1 refills  Please review, thanks!

## 2022-02-20 ENCOUNTER — Ambulatory Visit: Payer: Medicare Other | Admitting: Family Medicine

## 2022-03-06 ENCOUNTER — Encounter: Payer: Medicare Other | Admitting: Family Medicine

## 2022-03-08 ENCOUNTER — Other Ambulatory Visit: Payer: Self-pay | Admitting: Family Medicine

## 2022-03-08 NOTE — Telephone Encounter (Signed)
Requested medication (s) are due for refill today: yes  Requested medication (s) are on the active medication list: yes  Last refill:  02/12/22 #30/0  Future visit scheduled: yes 03/29/22  Notes to clinic:  pt is overdue for appt and klonopin is not delegated. Please advise on refill until appt      Requested Prescriptions  Pending Prescriptions Disp Refills   glycopyrrolate (ROBINUL) 1 MG tablet [Pharmacy Med Name: GLYCOPYRROLATE 1MG  TAB] 30 tablet 0    Sig: TAKE ONE TABLET BY MOUTH DAILY *NEEDS APPT FOR FURTHER REFILLS*     Gastroenterology:  Antispasmodic Agents Failed - 03/08/2022  2:24 PM      Failed - Valid encounter within last 12 months    Recent Outpatient Visits           1 year ago General medical exam   [facility]   1 year ago Toe pain, left   [facility], MD   2 years ago 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 [facility])   [facility] Pickard, [facility], MD   2 years ago Essential tremor   [facility] Pickard, [facility], MD   3 years ago General medical exam   [facility] Pickard, [facility], MD       Future Appointments             In 3 weeks Pickard, Priscille Heidelberg, MD [facility]             clonazePAM (KLONOPIN) 1 MG tablet [Pharmacy Med Name: CLONAZEPAM 1MG  TAB] 60 tablet     Sig: TAKE ONE TABLET BY MOUTH TWICE A DAY AS NEEDED FOR ANXIETY     Not Delegated - Psychiatry: Anxiolytics/Hypnotics 2 Failed - 03/08/2022  2:24 PM      Failed - This refill cannot be delegated      Failed - Urine Drug Screen completed in last 360 days      Failed - Valid encounter within last 6 months    Recent Outpatient Visits           1 year ago General medical exam   [facility] 03/10/2022, MD   1 year ago Toe pain, left   [facility] Donita Brooks, MD   2 years ago Spastic  quadriparesis secondary to cerebral palsy [facility])   [facility], MD   2 years ago Essential tremor   [facility] Pickard, Donita Brooks, MD   3 years ago General medical exam   [facility] Pickard, Priscille Heidelberg, MD       Future Appointments             In 3 weeks Pickard, [facility], MD [facility]            Passed - Patient is not pregnant

## 2022-03-14 ENCOUNTER — Other Ambulatory Visit: Payer: Self-pay | Admitting: Family Medicine

## 2022-03-14 DIAGNOSIS — M170 Bilateral primary osteoarthritis of knee: Secondary | ICD-10-CM

## 2022-03-15 ENCOUNTER — Other Ambulatory Visit: Payer: Self-pay | Admitting: Family Medicine

## 2022-03-15 ENCOUNTER — Other Ambulatory Visit: Payer: Self-pay

## 2022-03-15 DIAGNOSIS — M170 Bilateral primary osteoarthritis of knee: Secondary | ICD-10-CM

## 2022-03-15 MED ORDER — IBUPROFEN 800 MG PO TABS: 800.0000 mg | ORAL_TABLET | Freq: Three times a day (TID) | ORAL | 0 refills | Status: DC | PRN

## 2022-03-15 MED ORDER — DICLOFENAC SODIUM 1 % EX GEL: CUTANEOUS | 0 refills | Status: DC

## 2022-03-28 ENCOUNTER — Other Ambulatory Visit: Payer: Self-pay | Admitting: Family Medicine

## 2022-03-28 ENCOUNTER — Other Ambulatory Visit: Payer: Self-pay | Admitting: Physical Medicine & Rehabilitation

## 2022-03-28 DIAGNOSIS — G800 Spastic quadriplegic cerebral palsy: Secondary | ICD-10-CM

## 2022-03-28 NOTE — Telephone Encounter (Signed)
Reached out to patient at [phone]  was given another number to reach patient at permanently at [phone]. Unable to reach anyone or leave VM. PT will need appt in order to get refill on medication.

## 2022-03-29 ENCOUNTER — Encounter: Payer: Medicare Other | Admitting: Family Medicine

## 2022-03-29 ENCOUNTER — Other Ambulatory Visit: Payer: Self-pay | Admitting: Family Medicine

## 2022-03-29 NOTE — Telephone Encounter (Signed)
Caregiver Malachi Bonds called this morning stating they had no transportation for patient. She states that [facility]'s transportation couldn't bring her here and will need to find a new provider since patient no longer lives with father and she can't get patient here. She is requesting refill on ibuprofen (ADVIL) 800 MG tablet [[id number]]    Order Details Dose: 800 mg Route: Oral Frequency: Every 8 hours PRN  Dispense Quantity: 30 tablet Refills: 0        Sig: Take 1 tablet (800 mg total) by mouth every 8 (eight) hours as needed.  Patient uses [employer]  CB# [phone]

## 2022-03-30 ENCOUNTER — Encounter: Payer: Medicare Other | Admitting: Family Medicine

## 2022-03-30 ENCOUNTER — Other Ambulatory Visit: Payer: Self-pay | Admitting: Family Medicine

## 2022-03-30 NOTE — Telephone Encounter (Signed)
  Notes to clinic:  pharmacy did not receive rx approval for Advil, they did receive one for Baclofen 03/28/22. Pt was no show this week then canceled another day this week. Scheduled with [name] at [facility] 06/05/22, please assess.      Requested Prescriptions  Pending Prescriptions Disp Refills   ibuprofen (ADVIL) 800 MG tablet 30 tablet 0    Sig: Take 1 tablet (800 mg total) by mouth every 8 (eight) hours as needed.     Analgesics:  NSAIDS Failed - 03/29/2022 11:57 AM      Failed - Manual Review: Labs are only required if the patient has taken medication for more than 8 weeks.      Failed - Cr in normal range and within 360 days    Creat  Date Value Ref Range Status  11/14/2020 0.52 0.50 - 1.10 mg/dL Final         Failed - HGB in normal range and within 360 days    Hemoglobin  Date Value Ref Range Status  11/14/2020 11.8 11.7 - 15.5 g/dL Final         Failed - PLT in normal range and within 360 days    Platelets  Date Value Ref Range Status  11/14/2020 334 140 - 400 Thousand/uL Final         Failed - HCT in normal range and within 360 days    HCT  Date Value Ref Range Status  11/14/2020 37.2 35.0 - 45.0 % Final         Failed - eGFR is 30 or above and within 360 days    GFR, Est African American  Date Value Ref Range Status  11/14/2020 148 > OR = 60 mL/min/1.54m Final   GFR, Est Non African American  Date Value Ref Range Status  11/14/2020 127 > OR = 60 mL/min/1.716mFinal         Failed - Valid encounter within last 12 months    Recent Outpatient Visits           1 year ago General medical exam   [facility]   2 years ago Toe pain, left   [facility]   2 years ago 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  [place], [place]gee[place]   2 years ago Essential tremor   [facility]ammie McgeeMD   3 years ago  General medical exam   [place]       Future Appointments             In 2 months Ed[place]letta [place] CageNP [facility] Patient is not pregnant

## 2022-03-30 NOTE — Telephone Encounter (Signed)
Requested medication (s) are due for refill today:   Provider to determine  Requested medication (s) are on the active medication list:   Yes  Future visit scheduled:   No  She has cancelled multiple appts with Dr. Dennard Schaumann.   Has appt. With [facility] on 06/05/2022 with [name], NP.      Last ordered: All labs due if Dr. Dennard Schaumann decides to reorder it.   Requested Prescriptions  Pending Prescriptions Disp Refills   ibuprofen (ADVIL) 800 MG tablet [Pharmacy Med Name: IBUPROFEN 800MG TAB] 30 tablet 0    Sig: TAKE ONE TABLET BY MOUTH EVERY 8 HOURS AS NEEDED     Analgesics:  NSAIDS Failed - 03/30/2022  9:23 AM      Failed - Manual Review: Labs are only required if the patient has taken medication for more than 8 weeks.      Failed - Cr in normal range and within 360 days    Creat  Date Value Ref Range Status  11/14/2020 0.52 0.50 - 1.10 mg/dL Final         Failed - HGB in normal range and within 360 days    Hemoglobin  Date Value Ref Range Status  11/14/2020 11.8 11.7 - 15.5 g/dL Final         Failed - PLT in normal range and within 360 days    Platelets  Date Value Ref Range Status  11/14/2020 334 140 - 400 Thousand/uL Final         Failed - HCT in normal range and within 360 days    HCT  Date Value Ref Range Status  11/14/2020 37.2 35.0 - 45.0 % Final         Failed - eGFR is 30 or above and within 360 days    GFR, Est African American  Date Value Ref Range Status  11/14/2020 148 > OR = 60 mL/min/1.89m Final   GFR, Est Non African American  Date Value Ref Range Status  11/14/2020 127 > OR = 60 mL/min/1.780mFinal         Failed - Valid encounter within last 12 months    Recent Outpatient Visits           1 year ago General medical exam   [facility] FrizzleMD   2 years ago Toe pain, left   [facility] FrizzleMD   2 years ago 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  [facility]  [facility], [place]ammie McgeeMD   2 years ago Essential tremor   Br[place]WaCammie McgeeMD   3 years ago General medical exam   BrHendricksickard, WaCammie McgeeMD       Future Appointments             In 2 months EdOletta LamasiMilford CageNP [facility] Patient is not pregnant

## 2022-04-12 ENCOUNTER — Other Ambulatory Visit: Payer: Self-pay | Admitting: Physical Medicine & Rehabilitation

## 2022-04-12 ENCOUNTER — Other Ambulatory Visit: Payer: Self-pay | Admitting: Family Medicine

## 2022-04-12 DIAGNOSIS — G800 Spastic quadriplegic cerebral palsy: Secondary | ICD-10-CM

## 2022-04-25 IMAGING — US US ABDOMEN LIMITED
1 series · 14 of 25 positions shown · non-contrast
Comparison: 08/09/2016 abdomen and pelvis CT

CLINICAL DATA: Right upper quadrant pain

EXAM:
ULTRASOUND ABDOMEN LIMITED RIGHT UPPER QUADRANT

[Series 1: us abdomen limited · 14 of 28 slices shown]
[im 1/28]
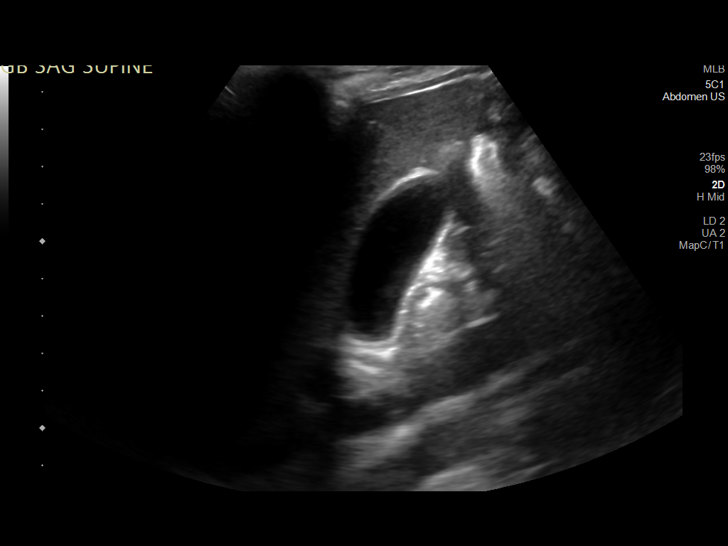
[im 3/28]
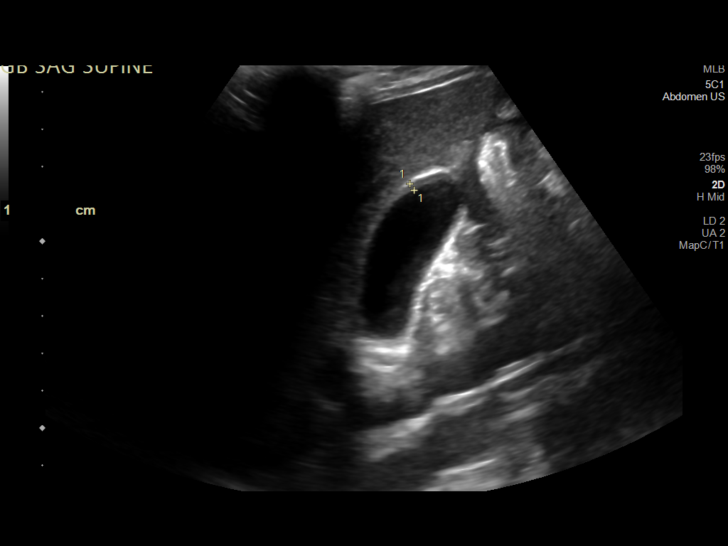
[im 5/28]
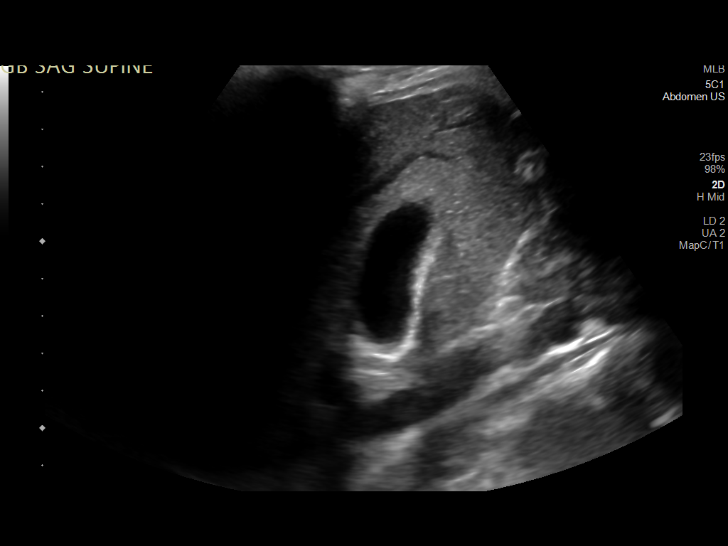
[im 7/28]
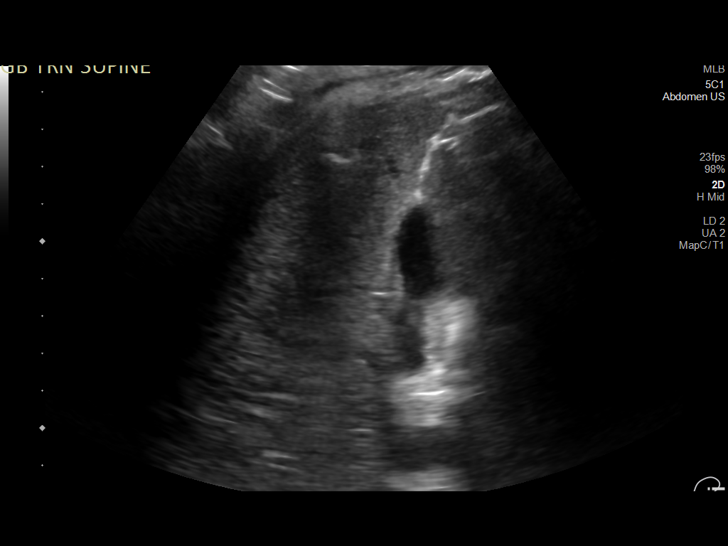
[im 10/28]
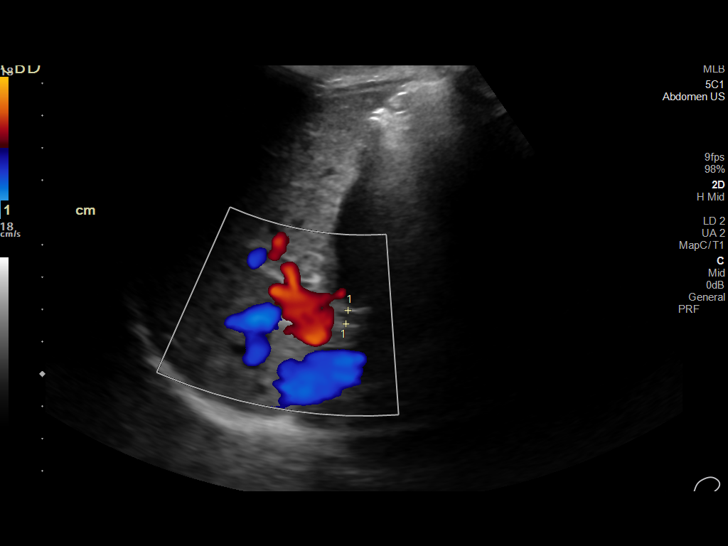
[im 11/28]
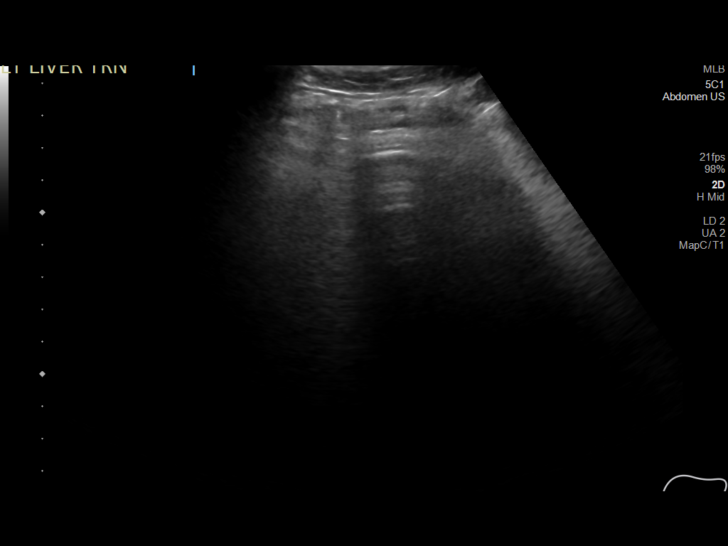
[im 13/28]
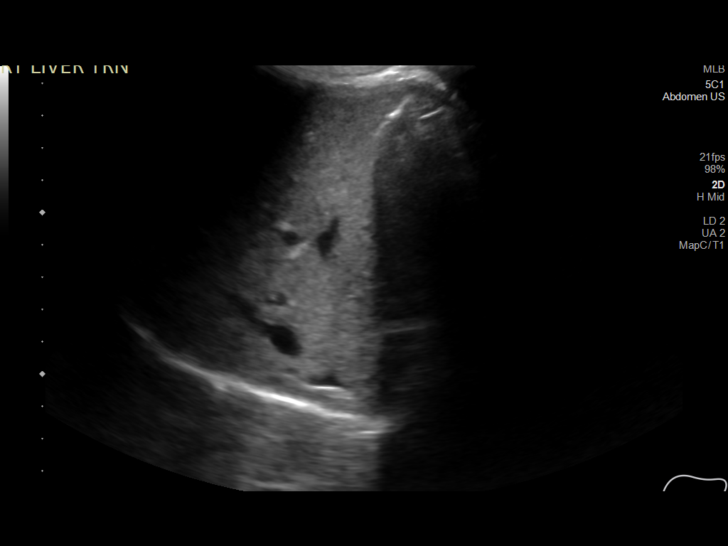
[im 15/28]
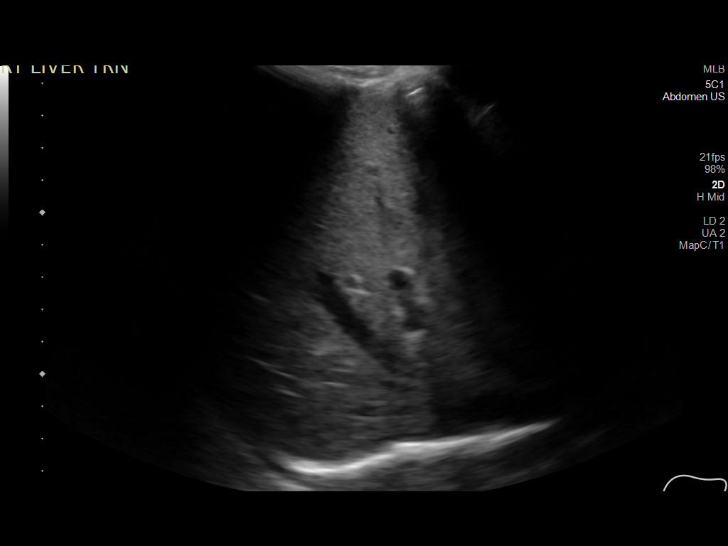
[im 17/28]
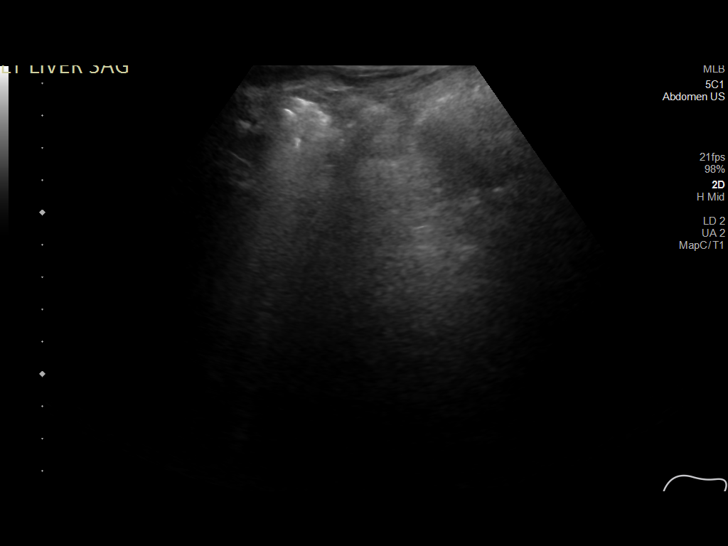
[im 19/28]
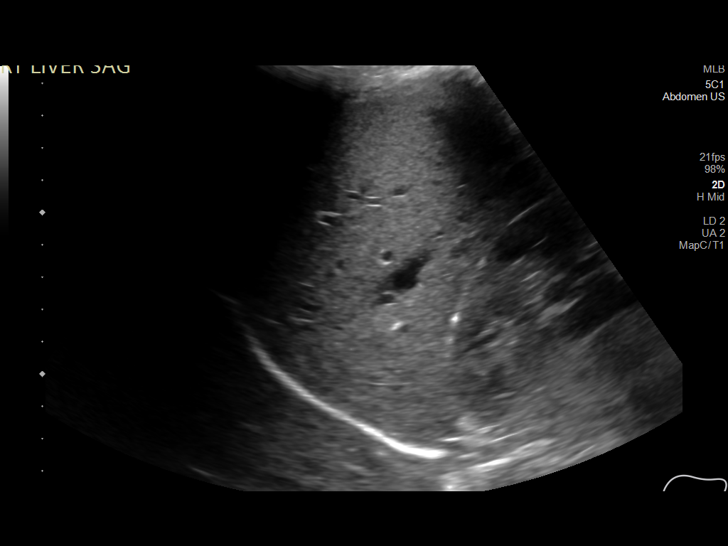
[im 21/28]
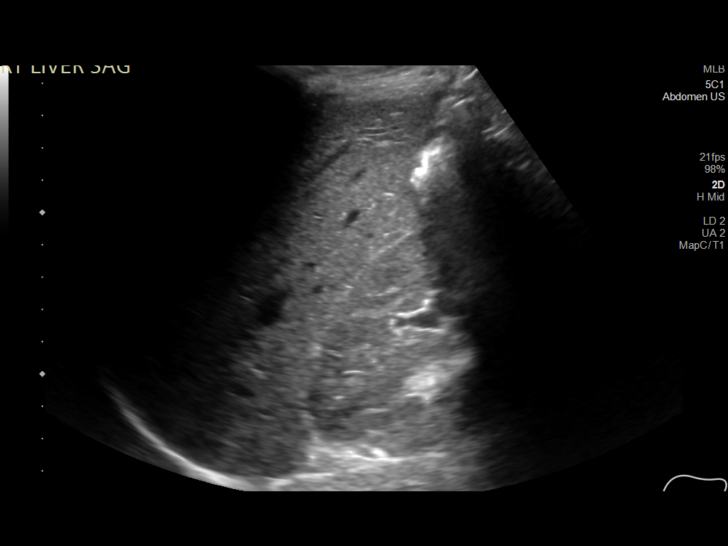
[im 23/28]
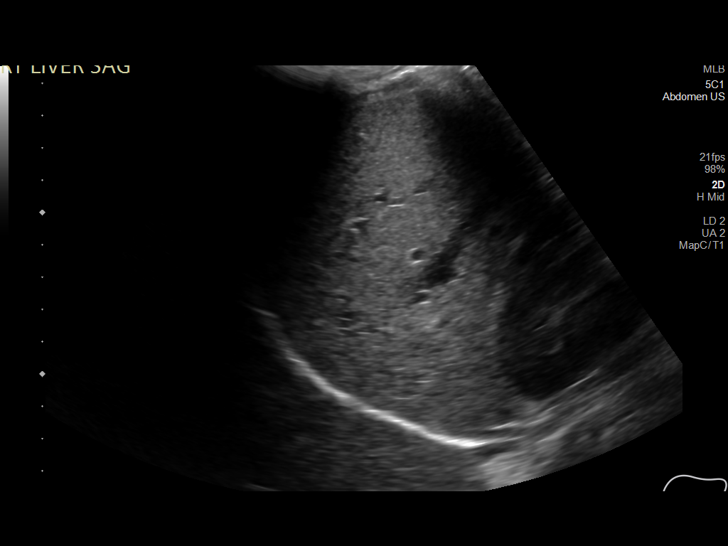
[im 25/28]
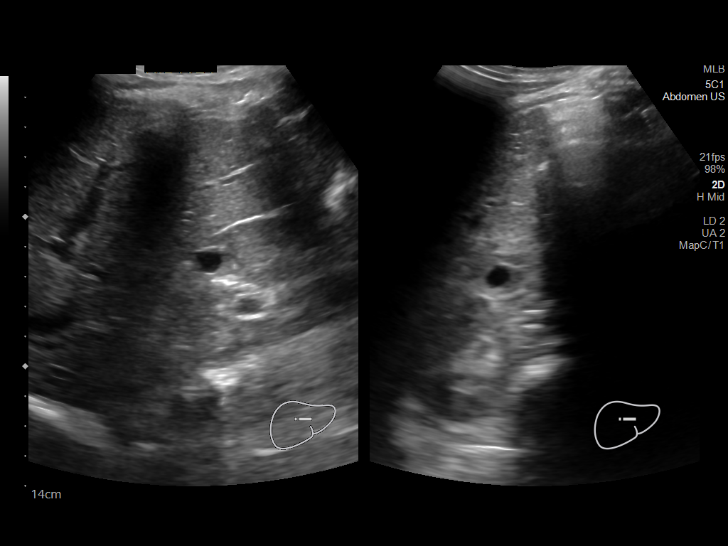
[im 28/28]
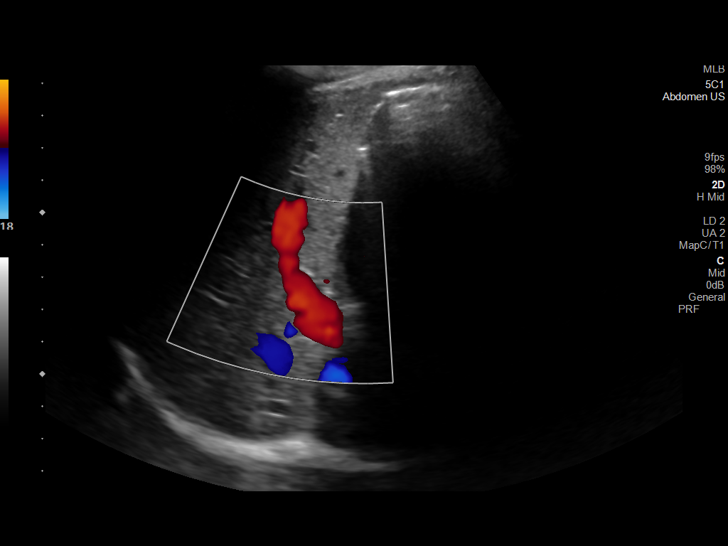

[14 of 25 positions shown; findings below may reference images not displayed]

FINDINGS: Gallbladder:

No gallstones or wall thickening visualized. No sonographic Murphy
sign noted by sonographer.

Common bile duct:

Diameter: 4 mm

Liver:

No focal lesion identified. Within normal limits in parenchymal
echogenicity. Portal vein is patent on color Doppler imaging with
normal direction of blood flow towards the liver.

Other: Limited visualization of the liver, especially the left lobe
given shadowing bowel gas.

Incidental 9 mm right renal cyst.
IMPRESSION: 1. No explanation for pain.  No cholecystitis.
2. Limited study due to bowel gas.

## 2022-04-26 ENCOUNTER — Other Ambulatory Visit: Payer: Self-pay | Admitting: Family Medicine

## 2022-04-26 ENCOUNTER — Telehealth: Payer: Self-pay

## 2022-04-26 DIAGNOSIS — M170 Bilateral primary osteoarthritis of knee: Secondary | ICD-10-CM

## 2022-04-26 MED ORDER — DICLOFENAC SODIUM 1 % EX GEL: CUTANEOUS | 0 refills | Status: DC

## 2022-04-26 MED ORDER — IBUPROFEN 800 MG PO TABS: 800.0000 mg | ORAL_TABLET | Freq: Three times a day (TID) | ORAL | 0 refills | Status: DC | PRN

## 2022-04-26 NOTE — Telephone Encounter (Signed)
Pt's caregiver called in to request these meds to be refilled for pt.  diclofenac Sodium (VOLTAREN) 1 % GEL [[id number]]  ibuprofen (ADVIL) 800 MG tablet [[id number]]    LOV: 11/14/20  PHARMACY:  [facility]

## 2022-05-14 ENCOUNTER — Encounter (HOSPITAL_BASED_OUTPATIENT_CLINIC_OR_DEPARTMENT_OTHER): Payer: Medicare Other | Attending: General Surgery | Admitting: General Surgery

## 2022-05-14 DIAGNOSIS — G40909 Epilepsy, unspecified, not intractable, without status epilepticus: Secondary | ICD-10-CM | POA: Insufficient documentation

## 2022-05-14 DIAGNOSIS — Z86718 Personal history of other venous thrombosis and embolism: Secondary | ICD-10-CM | POA: Insufficient documentation

## 2022-05-14 DIAGNOSIS — L89313 Pressure ulcer of right buttock, stage 3: Secondary | ICD-10-CM | POA: Diagnosis not present

## 2022-05-14 DIAGNOSIS — E43 Unspecified severe protein-calorie malnutrition: Secondary | ICD-10-CM | POA: Diagnosis not present

## 2022-05-14 DIAGNOSIS — L89100 Pressure ulcer of unspecified part of back, unstageable: Secondary | ICD-10-CM | POA: Insufficient documentation

## 2022-05-14 DIAGNOSIS — L89103 Pressure ulcer of unspecified part of back, stage 3: Secondary | ICD-10-CM | POA: Diagnosis not present

## 2022-05-14 DIAGNOSIS — L89214 Pressure ulcer of right hip, stage 4: Secondary | ICD-10-CM | POA: Diagnosis not present

## 2022-05-14 DIAGNOSIS — G800 Spastic quadriplegic cerebral palsy: Secondary | ICD-10-CM | POA: Insufficient documentation

## 2022-05-14 NOTE — Progress Notes (Signed)
SHANAIYA, BENE ([id number]) Visit Report for 05/14/2022 Allergy List Details Patient Name: Date of Service: RO [place] N. 05/14/2022 9:00 A M Medical Record Number: [id number] Patient Account Number: [id number] Date of Birth/Sex: Treating RN: 16-Feb-1991 (31 y.o. Tommye Standard Primary Care Jaydalyn Demattia: Lynnea Ferrier Other Clinician: Referring Anabell Swint: Treating Maleak Brazzel/Extender: Suzan Garibaldi in Treatment: 0 Allergies Active Allergies No Known Allergies Allergy Notes Electronic Signature(s) Signed: 05/14/2022 6:17:04 PM By: Zenaida Deed RN, BSN Entered By: Zenaida Deed on 05/14/2022 08:44:41 -------------------------------------------------------------------------------- Arrival Information Details Patient Name: Date of Service: [facility] [place] N. 05/14/2022 9:00 A M Medical Record Number: [id number] Patient Account Number: [id number] Date of Birth/Sex: Treating RN: 07-14-1991 (31 y.o. Tommye Standard Primary Care Jahmia Berrett: Lynnea Ferrier Other Clinician: Referring Martisha Toulouse: Treating Fallyn Munnerlyn/Extender: Suzan Garibaldi in Treatment: 0 Visit Information Patient Arrived: Wheel Chair Arrival Time: 09:12 Accompanied By: caregiver Transfer Assistance: Michiel Sites Lift Patient Identification Verified: Yes Secondary Verification Process Completed: Yes Patient Requires Transmission-Based Precautions: No Patient Has Alerts: No Electronic Signature(s) Signed: 05/14/2022 6:17:04 PM By: Zenaida Deed RN, BSN Entered By: Zenaida Deed on 05/14/2022 09:18:30 -------------------------------------------------------------------------------- Clinic Level of Care Assessment Details Patient Name: Date of Service: RO Rolland [place]. 05/14/2022 9:00 A M Medical Record Number: [id number] Patient Account Number: [id number] Date of Birth/Sex: Treating RN: 04-29-1991 (31 y.o. Tommye Standard Primary Care  Tisha Cline: Lynnea Ferrier Other Clinician: Referring Halia Franey: Treating Sachiko Methot/Extender: Suzan Garibaldi in Treatment: 0 Clinic Level of Care Assessment Items TOOL 1 Quantity Score []  - 0 Use when EandM and Procedure is performed on INITIAL visit ASSESSMENTS - Nursing Assessment / Reassessment X- 1 20 General Physical Exam (combine w/ comprehensive assessment (listed just below) when performed on new pt. evals) X- 1 25 Comprehensive Assessment (HX, ROS, Risk Assessments, Wounds Hx, etc.) ASSESSMENTS - Wound and Skin Assessment / Reassessment []  - 0 Dermatologic / Skin Assessment (not related to wound area) ASSESSMENTS - Ostomy and/or Continence Assessment and Care []  - 0 Incontinence Assessment and Management []  - 0 Ostomy Care Assessment and Management (repouching, etc.) PROCESS - Coordination of Care X - Simple Patient / Family Education for ongoing care 1 15 []  - 0 Complex (extensive) Patient / Family Education for ongoing care X- 1 10 Staff obtains , Records, T Results / Process Orders est X- 1 10 Staff telephones HHA, Nursing Homes / Clarify orders / etc []  - 0 Routine Transfer to another Facility (non-emergent condition) []  - 0 Routine Hospital Admission (non-emergent condition) X- 1 15 New Admissions / / Ordering NPWT Apligraf, etc. , []  - 0 Emergency Hospital Admission (emergent condition) PROCESS - Special Needs []  - 0 Pediatric / Minor Patient Management []  - 0 Isolation Patient Management []  - 0 Hearing / Language / Visual special needs []  - 0 Assessment of Community assistance (transportation, D/C planning, etc.) []  - 0 Additional assistance / Altered mentation []  - 0 Support Surface(s) Assessment (bed, cushion, seat, etc.) INTERVENTIONS - Miscellaneous []  - 0 External ear exam X- 1 10 Patient Transfer (multiple staff / / Similar devices) []  - 0 Simple Staple / Suture removal  (25 or less) []  - 0 Complex Staple / Suture removal (26 or more) []  - 0 Hypo/Hyperglycemic Management (do not check if billed separately) []  - 0 Ankle / Brachial Index (ABI) - do not check if billed separately Has the patient been seen at the hospital within the last three  years: Yes Total Score: 105 Level Of Care: New/Established - Level 3 Electronic Signature(s) Signed: 05/14/2022 6:17:04 PM By: Zenaida Deed RN, BSN Entered By: Zenaida Deed on 05/14/2022 14:49:20 -------------------------------------------------------------------------------- Encounter Discharge Information Details Patient Name: Date of Service: [address] [place] N. 05/14/2022 9:00 A M Medical Record Number: [id number] Patient Account Number: [id number] Date of Birth/Sex: Treating RN: 01/16/1991 (31 y.o. Tommye Standard Primary Care Ketzaly Cardella: Lynnea Ferrier Other Clinician: Referring Bear Osten: Treating Kaiulani Sitton/Extender: Suzan Garibaldi in Treatment: 0 Encounter Discharge Information Items Post Procedure Vitals Discharge Condition: Stable Temperature (F): 97.4 Ambulatory Status: Wheelchair Pulse (bpm): 116 Discharge Destination: Home Respiratory Rate (breaths/min): 18 Transportation: Other Blood Pressure (mmHg): 105/72 Accompanied By: caregiver Schedule Follow-up Appointment: Yes Clinical Summary of Care: Patient Declined Notes SCAT Electronic Signature(s) Signed: 05/14/2022 6:17:04 PM By: Zenaida Deed RN, BSN Entered By: Zenaida Deed on 05/14/2022 14:51:02 -------------------------------------------------------------------------------- Lower Extremity Assessment Details Patient Name: Date of Service: RO Rolland [place]. 05/14/2022 9:00 A M Medical Record Number: [id number] Patient Account Number: [id number] Date of Birth/Sex: Treating RN: 08/09/1991 (31 y.o. Tommye Standard Primary Care Ovella Manygoats: Lynnea Ferrier Other Clinician: Referring  Freddie Nghiem: Treating Jashanti Clinkscale/Extender: Suzan Garibaldi in Treatment: 0 Electronic Signature(s) Signed: 05/14/2022 6:17:04 PM By: Zenaida Deed RN, BSN Entered By: Zenaida Deed on 05/14/2022 09:44:18 -------------------------------------------------------------------------------- Multi Wound Chart Details Patient Name: Date of Service: [facility] [place]. 05/14/2022 9:00 A M Medical Record Number: [id number] Patient Account Number: [id number] Date of Birth/Sex: Treating RN: [birth year] (31 y.o. F) Primary Care Ferguson Gertner: Lynnea Ferrier Other Clinician: Referring Elham Fini: Treating Vivi Piccirilli/Extender: Suzan Garibaldi in Treatment: 0 Vital Signs Height(in): Pulse(bpm): 116 Weight(lbs): 70 Blood Pressure(mmHg): 105/72 Body Mass Index(BMI): Temperature(F): 97.4 Respiratory Rate(breaths/min): 18 Photos: Right Trochanter Right Back Right Gluteus Wound Location: Pressure Injury Pressure Injury Pressure Injury Wounding Event: Pressure Ulcer Pressure Ulcer Pressure Ulcer Primary Etiology: Deep Vein Thrombosis, Quadriplegia, Deep Vein Thrombosis, Quadriplegia, Deep Vein Thrombosis, Quadriplegia, Comorbid History: Seizure Disorder, Anorexia/bulimia Seizure Disorder, Anorexia/bulimia Seizure Disorder, Anorexia/bulimia 01/25/2022 01/25/2022 01/25/2022 Date Acquired: 0 0 0 Weeks of Treatment: Open Open Open Wound Status: No No No Wound Recurrence: Yes Yes No Clustered Wound: N/A 3 N/A Clustered Quantity: 7.5x6.1x0.9 7x8x0.1 3.8x4.7x0.1 Measurements L x W x D (cm) 35.932 43.982 14.027 A (cm) : rea 32.339 4.398 1.403 Volume (cm) : 0.00% 0.00% 0.00% % Reduction in A rea: 0.00% 0.00% 0.00% % Reduction in Volume: 2 Starting Position 1 (o'clock): 10 Ending Position 1 (o'clock): 2.3 Maximum Distance 1 (cm): Yes No No Undermining: Category/Stage IV Unstageable/Unclassified Category/Stage III Classification: Large Medium  Medium Exudate A mount: Serosanguineous Serous Serosanguineous Exudate Type: red, brown amber red, brown Exudate Color: Yes No No Foul Odor A Cleansing: fter No N/A N/A Odor A nticipated Due to Product Use: Well defined, not attached Flat and Intact Flat and Intact Wound Margin: Medium (34-66%) Small (1-33%) Medium (34-66%) Granulation A mount: Red Red Red Granulation Quality: Medium (34-66%) Large (67-100%) Medium (34-66%) Necrotic A mount: Adherent Slough Eschar, Adherent Slough Eschar, Adherent Slough Necrotic Tissue: Fat Layer (Subcutaneous Tissue): Yes Fat Layer (Subcutaneous Tissue): Yes Fat Layer (Subcutaneous Tissue): Yes Exposed Structures: Muscle: Yes Fascia: No Fascia: No Bone: Yes Tendon: No Tendon: No Fascia: No Muscle: No Muscle: No Tendon: No Joint: No Joint: No Joint: No Bone: No Bone: No None Small (1-33%) Small (1-33%) Epithelialization: Debridement - Selective/Open Wound Debridement - Selective/Open Wound Debridement - Selective/Open Wound Debridement: Pre-procedure Verification/Time Out 10:20 10:20 10:20  Taken: Lidocaine 5% topical ointment Lidocaine 5% topical ointment Lidocaine 5% topical ointment Pain Control: Necrotic/Eschar, Therapist, occupationallough Necrotic/Eschar Necrotic/Eschar Tissue Debrided: Non-Viable Tissue Non-Viable Tissue Non-Viable Tissue Level: 6 21 17.86 Debridement A (sq cm): rea Curette, Forceps, Scissors Curette Curette Instrument: Minimum Minimum Minimum Bleeding: Pressure Pressure Pressure Hemostasis A chieved: 6 1 1  Procedural Pain: 3 0 0 Post Procedural Pain: Procedure was tolerated well Procedure was tolerated well Procedure was tolerated well Debridement Treatment Response: 7.5x6.1x0.9 7x9x0.1 3.8x4.7x0.1 Post Debridement Measurements L x W x D (cm) 32.339 4.948 1.403 Post Debridement Volume: (cm) Category/Stage IV Unstageable/Unclassified Category/Stage III Post Debridement Stage: Debridement Debridement  Debridement Procedures Performed: Treatment Notes Electronic Signature(s) Signed: 05/14/2022 10:46:36 AM By: Duanne Guessannon, Jennifer MD FACS Entered By: Duanne Guessannon, Jennifer on 05/14/2022 10:46:36 -------------------------------------------------------------------------------- Multi-Disciplinary Care Plan Details Patient Name: Date of Service: [name] 05/14/2022 9:00 A M Medical Record Number: [id number] Patient Account Number: [id number] Date of Birth/Sex: Treating RN: September 18, 1990 (31 y.o. Tommye StandardF) Boehlein, Linda Primary Care Alexie Lanni: Lynnea FerrierPickard, Warren Other Clinician: Referring Kalen Ratajczak: Treating Ketsia Linebaugh/Extender: Suzan Garibaldiannon, Jennifer Pickard, Warren Weeks in Treatment: 0 Multidisciplinary Care Plan reviewed with physician Active Inactive Nutrition Nursing Diagnoses: Imbalanced nutrition Potential for alteratiion in Nutrition/Potential for imbalanced nutrition Goals: Patient/caregiver agrees to and verbalizes understanding of need to use nutritional supplements and/or vitamins as prescribed Date Initiated: 05/14/2022 Target Resolution Date: 06/11/2022 Goal Status: Active Interventions: Assess patient nutrition upon admission and as needed per policy Provide education on nutrition Treatment Activities: Dietary management education, guidance and counseling : 05/14/2022 Patient referred to Primary Care Physician for further nutritional evaluation : 05/14/2022 Notes: Pressure Nursing Diagnoses: Knowledge deficit related to causes and risk factors for pressure ulcer development Knowledge deficit related to management of pressures ulcers Goals: Patient will remain free from development of additional pressure ulcers Date Initiated: 05/14/2022 Target Resolution Date: 06/11/2022 Goal Status: Active Patient/caregiver will verbalize understanding of pressure ulcer management Date Initiated: 05/14/2022 Target Resolution Date: 06/11/2022 Goal Status: Active Interventions: Assess:  immobility, friction, shearing, incontinence upon admission and as needed Assess offloading mechanisms upon admission and as needed Assess potential for pressure ulcer upon admission and as needed Treatment Activities: Patient referred for home evaluation of offloading devices/mattresses : 05/14/2022 Pressure reduction/relief device ordered : 05/14/2022 Notes: Wound/Skin Impairment Nursing Diagnoses: Impaired tissue integrity Knowledge deficit related to ulceration/compromised skin integrity Goals: Patient/caregiver will verbalize understanding of skin care regimen Date Initiated: 05/14/2022 Target Resolution Date: 06/11/2022 Goal Status: Active Ulcer/skin breakdown will have a volume reduction of 30% by week 4 Date Initiated: 05/14/2022 Target Resolution Date: 06/11/2022 Goal Status: Active Interventions: Assess patient/caregiver ability to obtain necessary supplies Assess patient/caregiver ability to perform ulcer/skin care regimen upon admission and as needed Assess ulceration(s) every visit Provide education on ulcer and skin care Treatment Activities: Skin care regimen initiated : 05/14/2022 Topical wound management initiated : 05/14/2022 Notes: Electronic Signature(s) Signed: 05/14/2022 6:17:04 PM By: Zenaida DeedBoehlein, Linda RN, BSN Entered By: Zenaida DeedBoehlein, Linda on 05/14/2022 10:26:28 -------------------------------------------------------------------------------- Pain Assessment Details Patient Name: Date of Service: [name], [name] 05/14/2022 9:00 A M Medical Record Number: [id number] Patient Account Number: [id number] Date of Birth/Sex: Treating RN: September 18, 1990 (31 y.o. Tommye StandardF) Boehlein, Linda Primary Care Milfred Krammes: Lynnea FerrierPickard, Warren Other Clinician: Referring Kosei Rhodes: Treating Solon Alban/Extender: Suzan Garibaldiannon, Jennifer Pickard, Warren Weeks in Treatment: 0 Active Problems Location of Pain Severity and Description of Pain Patient Has Paino Yes Site Locations Pain  Location: Generalized Pain, Pain in Ulcers With Dressing Change: Yes Duration of the Pain. Constant / Intermittento Constant Rate  the pain. Current Pain Level: 5 Character of Pain Describe the Pain: Aching Pain Management and Medication Current Pain Management: Medication: Yes Other: reposition Is the Current Pain Management Adequate: Adequate How does your wound impact your activities of daily livingo Sleep: No Bathing: No Appetite: No Relationship With Others: No Bladder Continence: No Emotions: Yes Bowel Continence: No Work: Yes Toileting: No Hobbies: No Dressing: No Electronic Signature(s) Signed: 05/14/2022 6:17:04 PM By: Baruch Gouty RN, BSN Entered By: Baruch Gouty on 05/14/2022 10:08:07 -------------------------------------------------------------------------------- Patient/Caregiver Education Details Patient Name: Date of Service: RO Lynnea Ferrier 9/18/2023andnbsp9:00 [place] Record Number: [id number] Patient Account Number: [id number] Date of Birth/Gender: Treating RN: Sep 09, 1990 (31 y.o. Elam Dutch Primary Care Physician: Jenna Luo Other Clinician: Referring Physician: Treating Physician/Extender: Mertha Finders in Treatment: 0 Education Assessment Education Provided To: Patient Education Topics Provided Nutrition: Handouts: Nutrition Methods: Explain/Verbal, Printed Responses: Reinforcements needed, State content correctly Pressure: Handouts: Pressure Ulcers: Care and Offloading, Pressure Ulcers: Care and Offloading 2 Methods: Explain/Verbal, Printed Responses: Reinforcements needed, State content correctly [place]: o Handouts: Welcome T [facility] [place] o Methods: Explain/Verbal, Printed Responses: Reinforcements needed, State content correctly Wound/Skin Impairment: Handouts: Caring for Your Ulcer, Skin Care Do's and Dont's Methods: Explain/Verbal,  Printed Responses: Reinforcements needed, State content correctly Electronic Signature(s) Signed: 05/14/2022 6:17:04 PM By: Baruch Gouty RN, BSN Entered By: Baruch Gouty on 05/14/2022 10:27:20 -------------------------------------------------------------------------------- Wound Assessment Details Patient Name: Date of Service: [place], [place] N. 05/14/2022 9:00 A M Medical Record Number: [id number] Patient Account Number: [id number] Date of Birth/Sex: Treating RN: 06-05-1991 (31 y.o. Elam Dutch Primary Care Esme Freund: Jenna Luo Other Clinician: Referring Baylin Cabal: Treating Tyshun Tuckerman/Extender: Mertha Finders in Treatment: 0 Wound Status Wound Number: 1 Primary Pressure Ulcer Etiology: Etiology: Wound Location: Right Trochanter Wound Status: Open Wounding Event: Pressure Injury Comorbid Deep Vein Thrombosis, Quadriplegia, Seizure Disorder, Date Acquired: 01/25/2022 History: Anorexia/bulimia Weeks Of Treatment: 0 Clustered Wound: Yes Photos Wound Measurements Length: (cm) 7.5 Width: (cm) 6.1 Depth: (cm) 0.9 Area: (cm) 35.932 Volume: (cm) 32.339 % Reduction in Area: 0% % Reduction in Volume: 0% Epithelialization: None Tunneling: No Undermining: Yes Starting Position (o'clock): 2 Ending Position (o'clock): 10 Maximum Distance: (cm) 2.3 Wound Description Classification: Category/Stage IV Wound Margin: Well defined, not attached Exudate Amount: Large Exudate Type: Serosanguineous Exudate Color: red, brown Foul Odor After Cleansing: Yes Due to Product Use: No Slough/Fibrino Yes Wound Bed Granulation Amount: Medium (34-66%) Exposed Structure Granulation Quality: Red Fascia Exposed: No Necrotic Amount: Medium (34-66%) Fat Layer (Subcutaneous Tissue) Exposed: Yes Necrotic Quality: Adherent Slough Tendon Exposed: No Muscle Exposed: Yes Necrosis of Muscle: No Joint Exposed: No Bone Exposed: Yes Treatment Notes Wound  #1 (Trochanter) Wound Laterality: Right Cleanser Peri-Wound Care Topical Primary Dressing Santyl Ointment Discharge Instruction: Apply nickel thick amount to wound edge Dakin's Solution 0.125%, 16 (oz) Discharge Instruction: Moisten gauze with Dakin's solution and pack lightly into wound Secondary Dressing Woven Gauze Sponge, Non-Sterile 4x4 in Discharge Instruction: Apply over primary dressing as directed. Zetuvit Plus Silicone Border Dressing 7x7(in/in) Discharge Instruction: Apply silicone border over primary dressing as directed. Secured With Compression Wrap Compression Stockings Environmental education officer) Signed: 05/14/2022 6:17:04 PM By: Baruch Gouty RN, BSN Entered By: Baruch Gouty on 05/14/2022 10:09:25 -------------------------------------------------------------------------------- Wound Assessment Details Patient Name: Date of Service: [place], [place] 05/14/2022 9:00 A M Medical Record Number: [id number] Patient Account Number: [id number] Date of Birth/Sex: Treating RN:  10/16/1990 (31 y.o. Tommye Standard Primary Care Jhalen Eley: Lynnea Ferrier Other Clinician: Referring Shayda Kalka: Treating Aristeo Hankerson/Extender: Suzan Garibaldi in Treatment: 0 Wound Status Wound Number: 2 Primary Pressure Ulcer Etiology: Wound Location: Right Back Wound Status: Open Wounding Event: Pressure Injury Comorbid Deep Vein Thrombosis, Quadriplegia, Seizure Disorder, Date Acquired: 01/25/2022 History: Anorexia/bulimia Weeks Of Treatment: 0 Clustered Wound: Yes Photos Wound Measurements Length: (cm) 7 Width: (cm) 8 Depth: (cm) 0.1 Clustered Quantity: 3 Area: (cm) 43.982 Volume: (cm) 4.398 % Reduction in Area: 0% % Reduction in Volume: 0% Epithelialization: Small (1-33%) Tunneling: No Undermining: No Wound Description Classification: Unstageable/Unclassified Wound Margin: Flat and Intact Exudate Amount: Medium Exudate Type:  Serous Exudate Color: amber Foul Odor After Cleansing: No Slough/Fibrino Yes Wound Bed Granulation Amount: Small (1-33%) Exposed Structure Granulation Quality: Red Fascia Exposed: No Necrotic Amount: Large (67-100%) Fat Layer (Subcutaneous Tissue) Exposed: Yes Necrotic Quality: Eschar, Adherent Slough Tendon Exposed: No Muscle Exposed: No Joint Exposed: No Bone Exposed: No Treatment Notes Wound #2 (Back) Wound Laterality: Right Cleanser Peri-Wound Care Topical Primary Dressing Santyl Ointment Discharge Instruction: Apply nickel thick amount to wound bed as instructed Secondary Dressing Zetuvit Plus Silicone Border Dressing 7x7(in/in) Discharge Instruction: Apply silicone border over primary dressing as directed. Secured With Compression Wrap Compression Stockings Facilities manager) Signed: 05/14/2022 6:17:04 PM By: Zenaida Deed RN, BSN Entered By: Zenaida Deed on 05/14/2022 10:09:49 -------------------------------------------------------------------------------- Wound Assessment Details Patient Name: Date of Service: [place] N. 05/14/2022 9:00 A M Medical Record Number: [id number] Patient Account Number: [id number] Date of Birth/Sex: Treating RN: May 13, 1991 (31 y.o. Tommye Standard Primary Care Render Marley: Lynnea Ferrier Other Clinician: Referring Rayvon Dakin: Treating Jadelin Eng/Extender: Suzan Garibaldi in Treatment: 0 Wound Status Wound Number: 3 Primary Pressure Ulcer Etiology: Wound Location: Right Gluteus Wound Status: Open Wounding Event: Pressure Injury Comorbid Deep Vein Thrombosis, Quadriplegia, Seizure Disorder, Date Acquired: 01/25/2022 History: Anorexia/bulimia Weeks Of Treatment: 0 Clustered Wound: No Photos Wound Measurements Length: (cm) 3.8 Width: (cm) 4.7 Depth: (cm) 0.1 Area: (cm) 14.027 Volume: (cm) 1.403 % Reduction in Area: 0% % Reduction in Volume: 0% Epithelialization: Small  (1-33%) Tunneling: No Undermining: No Wound Description Classification: Category/Stage III Wound Margin: Flat and Intact Exudate Amount: Medium Exudate Type: Serosanguineous Exudate Color: red, brown Foul Odor After Cleansing: No Slough/Fibrino Yes Wound Bed Granulation Amount: Medium (34-66%) Exposed Structure Granulation Quality: Red Fascia Exposed: No Necrotic Amount: Medium (34-66%) Fat Layer (Subcutaneous Tissue) Exposed: Yes Necrotic Quality: Eschar, Adherent Slough Tendon Exposed: No Muscle Exposed: No Joint Exposed: No Bone Exposed: No Treatment Notes Wound #3 (Gluteus) Wound Laterality: Right Cleanser Peri-Wound Care Topical Primary Dressing Santyl Ointment Discharge Instruction: Apply nickel thick amount to wound bed as instructed Secondary Dressing Zetuvit Plus Silicone Border Dressing 4x4 (in/in) Discharge Instruction: Apply silicone border over primary dressing as directed. Secured With Compression Wrap Compression Stockings Facilities manager) Signed: 05/14/2022 6:17:04 PM By: Zenaida Deed RN, BSN Entered By: Zenaida Deed on 05/14/2022 10:10:16 -------------------------------------------------------------------------------- Vitals Details Patient Name: Date of Service: [place] N. 05/14/2022 9:00 A M Medical Record Number: [id number] Patient Account Number: [id number] Date of Birth/Sex: Treating RN: 1990-10-08 (31 y.o. Tommye Standard Primary Care Brodie Correll: Lynnea Ferrier Other Clinician: Referring Pleasant Bensinger: Treating Ocie Stanzione/Extender: Suzan Garibaldi in Treatment: 0 Vital Signs Time Taken: 09:18 Temperature (F): 97.4 Weight (lbs): 70 Pulse (bpm): 116 Source: Stated Respiratory Rate (breaths/min): 18 Blood Pressure (mmHg): 105/72 Reference Range: 80 - 120 mg / dl Electronic Signature(s)  Signed: 05/14/2022 6:17:04 PM By: Zenaida Deed RN, BSN Entered By: Zenaida Deed on  05/14/2022 09:28:17

## 2022-05-14 NOTE — Progress Notes (Signed)
Nancy, [name] ([id number]) Visit Report for 05/14/2022 Abuse Risk Screen Details Patient Name: Date of Service: [name] [place] N. 05/14/2022 9:00 [place] Record Number: [id number] Patient Account Number: [id number] Date of Birth/Sex: Treating RN: 08-07-1991 (31 y.o. Elam Dutch Primary Care Laken Rog: Jenna Luo Other Clinician: Referring Saleemah Mollenhauer: Treating Eliyanna Ault/Extender: Mertha Finders in Treatment: 0 Abuse Risk Screen Items Answer ABUSE RISK SCREEN: Has anyone close to you tried to hurt or harm you recentlyo No Do you feel uncomfortable with anyone in your familyo No Has anyone forced you do things that you didnt want to doo No Electronic Signature(s) Signed: 05/14/2022 6:17:04 PM By: Baruch Gouty RN, BSN Entered By: Baruch Gouty on 05/14/2022 09:40:13 -------------------------------------------------------------------------------- Activities of Daily Living Details Patient Name: Date of Service: Nancy [name], Nancy [name] 05/14/2022 9:00 [place] Record Number: [id number] Patient Account Number: [id number] Date of Birth/Sex: Treating RN: 02/21/1991 (31 y.o. Elam Dutch Primary Care Jaxsyn Catalfamo: Jenna Luo Other Clinician: Referring Luann Aspinwall: Treating Kilea Mccarey/Extender: Mertha Finders in Treatment: 0 Activities of Daily Living Items Answer Activities of Daily Living (Please select one for each item) Drive Automobile Not Able T Medications ake Need Assistance Use T elephone Not Able Care for Appearance Not Able Use T oilet Not Able Manus Rudd / Shower Not Able Dress Self Not Able Feed Self Need Assistance Walk Not Able Get In / Out Bed Not Able Housework Not Able Prepare Meals Not Able Handle Money Not Able Shop for Self Not Able Electronic Signature(s) Signed: 05/14/2022 6:17:04 PM By: Baruch Gouty RN, BSN Entered By: Baruch Gouty on 05/14/2022  09:40:48 -------------------------------------------------------------------------------- Education Screening Details Patient Name: Date of Service: Nancy [name] [place] N. 05/14/2022 9:00 [place] Record Number: [id number] Patient Account Number: [id number] Date of Birth/Sex: Treating RN: 07-03-1991 (31 y.o. Elam Dutch Primary Care Serenidy Waltz: Jenna Luo Other Clinician: Referring Abbagail Scaff: Treating Antwion Carpenter/Extender: Mertha Finders in Treatment: 0 Primary Learner Assessed: Patient Learning Preferences/Education Level/Primary Language Learning Preference: Explanation, Demonstration Highest Education Level: College or Above Preferred Language: English Cognitive Barrier Language Barrier: No Translator Needed: No Memory Deficit: No Emotional Barrier: No Cultural/Religious Beliefs Affecting Medical Care: No Physical Barrier Impaired Vision: Yes Glasses Impaired Hearing: No Decreased Hand dexterity: Yes Limitations: contractures, spacicity Knowledge/Comprehension Knowledge Level: Medium Comprehension Level: Medium Ability to understand written instructions: Medium Ability to understand verbal instructions: Medium Motivation Anxiety Level: Calm Cooperation: Cooperative Education Importance: Acknowledges Need Interest in Health Problems: Asks Questions Perception: Coherent Willingness to Engage in Self-Management Medium Activities: Readiness to Engage in Self-Management Medium Activities: Electronic Signature(s) Signed: 05/14/2022 6:17:04 PM By: Baruch Gouty RN, BSN Entered By: Baruch Gouty on 05/14/2022 09:42:09 -------------------------------------------------------------------------------- Fall Risk Assessment Details Patient Name: Date of Service: Nancy [name], Nancy [name] 05/14/2022 9:00 [place] Record Number: [id number] Patient Account Number: [id number] Date of Birth/Sex: Treating RN: 12/01/1990 (31 y.o. Elam Dutch Primary Care Shyheem Whitham: Jenna Luo Other Clinician: Referring Elliannah Wayment: Treating Deane Wattenbarger/Extender: Mertha Finders in Treatment: 0 Fall Risk Assessment Items Have you had 2 or more falls in the last 12 monthso 0 No Have you had any fall that resulted in injury in the last 12 monthso 0 No FALLS RISK SCREEN History of falling - immediate or within 3 months 0 No Secondary diagnosis (Do you have 2 or more medical diagnoseso) 0 No Ambulatory aid None/bed rest/wheelchair/nurse 0 Yes Crutches/cane/walker 0 No Furniture 0 No Intravenous therapy  Access/Saline/Heparin Lock 0 No Gait/Transferring Normal/ bed rest/ wheelchair 0 Yes Weak (short steps with or without shuffle, stooped but able to lift head while walking, may seek 0 No support from furniture) Impaired (short steps with shuffle, may have difficulty arising from chair, head down, impaired 0 No balance) Mental Status Oriented to own ability 0 Yes Electronic Signature(s) Signed: 05/14/2022 6:17:04 PM By: Zenaida Deed RN, BSN Entered By: Zenaida Deed on 05/14/2022 09:42:29 -------------------------------------------------------------------------------- Foot Assessment Details Patient Name: Date of Service: Nancy [name] [place] N. 05/14/2022 9:00 [facility] Record Number: [id number] Patient Account Number: [id number] Date of Birth/Sex: Treating RN: Jul 07, 1991 (30 y.o. Tommye Standard Primary Care Lillianah Swartzentruber: Lynnea Ferrier Other Clinician: Referring Markan Cazarez: Treating Marilee Ditommaso/Extender: Suzan Garibaldi in Treatment: 0 Foot Assessment Items Site Locations + = Sensation present, - = Sensation absent, C = Callus, U = Ulcer R = Redness, W = Warmth, M = Maceration, PU = Pre-ulcerative lesion F = Fissure, S = Swelling, D = Dryness Assessment Right: Left: Other Deformity: No No Prior Foot Ulcer: No No Prior Amputation: No No Charcot Joint: No  No Ambulatory Status: Non-ambulatory Assistance Device: Wheelchair Gait: Electronic Signature(s) Signed: 05/14/2022 6:17:04 PM By: Zenaida Deed RN, BSN Entered By: Zenaida Deed on 05/14/2022 09:44:08 -------------------------------------------------------------------------------- Nutrition Risk Screening Details Patient Name: Date of Service: Nancy [name] [place] N. 05/14/2022 9:00 A M Medical Record Number: [id number] Patient Account Number: [id number] Date of Birth/Sex: Treating RN: 02-18-1991 (30 y.o. Tommye Standard Primary Care Newell Wafer: Lynnea Ferrier Other Clinician: Referring Santez Woodcox: Treating Yuan Gann/Extender: Suzan Garibaldi in Treatment: 0 Height (in): Weight (lbs): 70 Body Mass Index (BMI): Nutrition Risk Screening Items Score Screening NUTRITION RISK SCREEN: I have an illness or condition that made me change the kind and/or amount of food I eat 2 Yes I eat fewer than two meals per day 0 No I eat few fruits and vegetables, or milk products 2 Yes I have three or more drinks of beer, liquor or wine almost every day 0 No I have tooth or mouth problems that make it hard for me to eat 0 No I don't always have enough money to buy the food I need 0 No I eat alone most of the time 0 No I take three or more different prescribed or over-the-counter drugs a day 1 Yes Without wanting to, I have lost or gained 10 pounds in the last six months 2 Yes I am not always physically able to shop, cook and/or feed myself 0 No Nutrition Protocols Good Risk Protocol Moderate Risk Protocol High Risk Proctocol 0 Provide education on nutrition Risk Level: High Risk Score: 7 Electronic Signature(s) Signed: 05/14/2022 6:17:04 PM By: Zenaida Deed RN, BSN Entered By: Zenaida Deed on 05/14/2022 09:43:55

## 2022-05-14 NOTE — Progress Notes (Signed)
[name], [name] ([id number]) Visit Report for 05/14/2022 Problem List Details Patient Name: Date of Service: RO Hosie [place] N. 05/14/2022 9:00 [place] Record Number: [id number] Patient Account Number: [id number] Date of Birth/Sex: Treating RN: [birth year] (31 y.o. 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

## 2022-05-17 ENCOUNTER — Other Ambulatory Visit: Payer: Self-pay | Admitting: Family Medicine

## 2022-05-17 ENCOUNTER — Other Ambulatory Visit: Payer: Self-pay | Admitting: Physical Medicine & Rehabilitation

## 2022-05-17 DIAGNOSIS — G800 Spastic quadriplegic cerebral palsy: Secondary | ICD-10-CM

## 2022-05-18 ENCOUNTER — Telehealth: Payer: Self-pay | Admitting: Family Medicine

## 2022-05-18 NOTE — Telephone Encounter (Signed)
Called patient to reschedule AWV from 07-02-22 to 06/06/22 due to change in template for Nurse Health Advisor. Appointment canceled altogether since patient moved and needs a provider closer to her area; [facility] wont' bring her to this office. Patient already scheduled for a new patient appt in her area on 06/05/22.

## 2022-05-21 ENCOUNTER — Encounter (HOSPITAL_BASED_OUTPATIENT_CLINIC_OR_DEPARTMENT_OTHER): Payer: Medicare Other | Admitting: General Surgery

## 2022-05-21 DIAGNOSIS — G800 Spastic quadriplegic cerebral palsy: Secondary | ICD-10-CM | POA: Diagnosis not present

## 2022-05-21 DIAGNOSIS — L89100 Pressure ulcer of unspecified part of back, unstageable: Secondary | ICD-10-CM | POA: Diagnosis not present

## 2022-05-21 DIAGNOSIS — L89314 Pressure ulcer of right buttock, stage 4: Secondary | ICD-10-CM | POA: Diagnosis not present

## 2022-05-21 DIAGNOSIS — L89313 Pressure ulcer of right buttock, stage 3: Secondary | ICD-10-CM | POA: Diagnosis not present

## 2022-05-21 DIAGNOSIS — E43 Unspecified severe protein-calorie malnutrition: Secondary | ICD-10-CM | POA: Diagnosis not present

## 2022-05-21 DIAGNOSIS — G40909 Epilepsy, unspecified, not intractable, without status epilepticus: Secondary | ICD-10-CM | POA: Diagnosis not present

## 2022-05-21 DIAGNOSIS — L89214 Pressure ulcer of right hip, stage 4: Secondary | ICD-10-CM | POA: Diagnosis not present

## 2022-05-21 NOTE — Progress Notes (Signed)
LEVI, CRASS ([id number]) Visit Report for 05/21/2022 Arrival Information Details Patient Name: Date of Service: RO Hosie [place] N. 05/21/2022 2:00 PM Medical Record Number: [id number] Patient Account Number: [id number] Date of Birth/Sex: Treating RN: 01/25/1991 (31 y.o. Elam Dutch Primary Care Candance Bohlman: Jenna Luo Other Clinician: Referring Sailor Haughn: Treating Stellah Donovan/Extender: Mertha Finders in Treatment: 1 Visit Information History Since Last Visit Added or deleted any medications: No Patient Arrived: Wheel Chair Any new allergies or adverse reactions: No Arrival Time: 14:08 Had a fall or experienced change in No Accompanied By: caregiver activities of daily living that may affect Transfer Assistance: Harrel Lemon Lift risk of falls: Patient Identification Verified: Yes Signs or symptoms of abuse/neglect since last visito No Secondary Verification Process Completed: Yes Hospitalized since last visit: No Patient Requires Transmission-Based Precautions: No Implantable device outside of the clinic excluding No Patient Has Alerts: No cellular tissue based products placed in the center since last visit: Has Dressing in Place as Prescribed: Yes Pain Present Now: Yes Electronic Signature(s) Signed: 05/21/2022 5:20:46 PM By: Baruch Gouty RN, BSN Entered By: Baruch Gouty on 05/21/2022 14:14:11 -------------------------------------------------------------------------------- Lower Extremity Assessment Details Patient Name: Date of Service: [place], [place] 05/21/2022 2:00 PM Medical Record Number: [id number] Patient Account Number: [id number] Date of Birth/Sex: Treating RN: 18-Feb-1991 (31 y.o. Elam Dutch Primary Care Conal Shetley: Jenna Luo Other Clinician: Referring Yevette Knust: Treating Nitza Schmid/Extender: Mertha Finders in Treatment: 1 Electronic Signature(s) Signed: 05/21/2022 5:20:46 PM By:  Baruch Gouty RN, BSN Entered By: Baruch Gouty on 05/21/2022 14:16:45 -------------------------------------------------------------------------------- Multi Wound Chart Details Patient Name: Date of Service: Clare G[place] N. 05/21/2022 2:00 PM Medical Record Number: [id number] Patient Account Number: [id number] Date of Birth/Sex: Treating RN: [birth year] (31 y.o. Martyn Malay, Linda Primary Care Xzavier Swinger: Jenna Luo Other Clinician: Referring Petra Dumler: Treating Miri Jose/Extender: Mertha Finders in Treatment: 1 Vital Signs Height(in): Pulse(bpm): 130 Weight(lbs): 88 Blood Pressure(mmHg): 101/70 Body Mass Index(BMI): Temperature(F): 99.3 Respiratory Rate(breaths/min): 18 Photos: Right Trochanter Right Back Right Gluteus Wound Location: Pressure Injury Pressure Injury Pressure Injury Wounding Event: Pressure Ulcer Pressure Ulcer Pressure Ulcer Primary Etiology: Deep Vein Thrombosis, Quadriplegia, Deep Vein Thrombosis, Quadriplegia, Deep Vein Thrombosis, Quadriplegia, Comorbid History: Seizure Disorder, Anorexia/bulimia Seizure Disorder, Anorexia/bulimia Seizure Disorder, Anorexia/bulimia 01/25/2022 01/25/2022 01/25/2022 Date Acquired: 1 1 1  Weeks of Treatment: Open Open Open Wound Status: No No No Wound Recurrence: Yes Yes No Clustered Wound: N/A 4 N/A Clustered Quantity: 7.5x4.6x1.1 9x8.6x0.1 1.8x4.1x0.1 Measurements L x W x D (cm) 27.096 60.79 5.796 A (cm) : rea 29.806 6.079 0.58 Volume (cm) : 24.60% -38.20% 58.70% % Reduction in A rea: 7.80% -38.20% 58.70% % Reduction in Volume: 6 Starting Position 1 (o'clock): 11 Ending Position 1 (o'clock): 1.7 Maximum Distance 1 (cm): Yes No No Undermining: Category/Stage IV Unstageable/Unclassified Category/Stage III Classification: Large Medium Medium Exudate A mount: Serosanguineous Serous Serosanguineous Exudate Type: red, brown amber red, brown Exudate Color: Well  defined, not attached Flat and Intact Flat and Intact Wound Margin: Medium (34-66%) Small (1-33%) Medium (34-66%) Granulation A mount: Red Red Red Granulation Quality: Medium (34-66%) Large (67-100%) Medium (34-66%) Necrotic A mount: Adherent Slough Eschar, Adherent Slough Adherent Slough Necrotic Tissue: Fat Layer (Subcutaneous Tissue): Yes Fat Layer (Subcutaneous Tissue): Yes Fat Layer (Subcutaneous Tissue): Yes Exposed Structures: Muscle: Yes Fascia: No Fascia: No Bone: Yes Tendon: No Tendon: No Fascia: No Muscle: No Muscle: No Tendon: No Joint: No Joint: No Joint: No Bone: No Bone: No None Small (1-33%)  Small (1-33%) Epithelialization: Debridement - Selective/Open Wound Debridement - Selective/Open Wound Debridement - Selective/Open Wound Debridement: Pre-procedure Verification/Time Out 14:50 14:50 14:50 Taken: Lidocaine 4% Topical Solution Lidocaine 4% Topical Solution Lidocaine 4% Topical Solution Pain Control: [facility] Tissue Debrided: Skin/Dermis Non-Viable Tissue Non-Viable Tissue Level: 34.5 28 7.38 Debridement A (sq cm): rea Curette, Forceps, Scissors Curette Curette Instrument: None None None Bleeding: Pressure Pressure Pressure Hemostasis A chieved: 5 2 2  Procedural Pain: 0 0 0 Post Procedural Pain: Procedure was tolerated well Procedure was tolerated well Procedure was tolerated well Debridement Treatment Response: 7.5x4.6x1.1 9x8.6x0.1 1.8x4.1x0.1 Post Debridement Measurements L x W x D (cm) 29.806 6.079 0.58 Post Debridement Volume: (cm) Category/Stage IV Unstageable/Unclassified Category/Stage III Post Debridement Stage: Debridement Debridement Debridement Procedures Performed: Wound Number: 4 N/A N/A Photos: N/A N/A Right, Lateral Upper Leg N/A N/A Wound Location: Pressure Injury N/A N/A Wounding Event: Pressure Ulcer N/A N/A Primary Etiology: Deep Vein Thrombosis, Quadriplegia, N/A N/A Comorbid History: Seizure  Disorder, Anorexia/bulimia 05/21/2022 N/A N/A Date A cquired: 0 N/A N/A Weeks of Treatment: Open N/A N/A Wound Status: No N/A N/A Wound Recurrence: No N/A N/A Clustered Wound: N/A N/A N/A Clustered Quantity: 1.7x2.4x0.1 N/A N/A Measurements L x W x D (cm) 3.204 N/A N/A A (cm) : rea 0.32 N/A N/A Volume (cm) : N/A N/A N/A % Reduction in A rea: N/A N/A N/A % Reduction in Volume: No N/A N/A Undermining: Category/Stage III N/A N/A Classification: Small N/A N/A Exudate A mount: Serosanguineous N/A N/A Exudate Type: red, brown N/A N/A Exudate Color: Flat and Intact N/A N/A Wound Margin: Large (67-100%) N/A N/A Granulation A mount: Red N/A N/A Granulation Quality: None Present (0%) N/A N/A Necrotic A mount: N/A N/A N/A Necrotic Tissue: Fat Layer (Subcutaneous Tissue): Yes N/A N/A Exposed Structures: Fascia: No Tendon: No Muscle: No Joint: No Bone: No Medium (34-66%) N/A N/A Epithelialization: N/A N/A N/A Debridement: N/A N/A N/A Pain Control: N/A N/A N/A Tissue Debrided: N/A N/A N/A Level: N/A N/A N/A Debridement A (sq cm): rea N/A N/A N/A Instrument: N/A N/A N/A Bleeding: N/A N/A N/A Hemostasis A chieved: N/A N/A N/A Procedural Pain: N/A N/A N/A Post Procedural Pain: Debridement Treatment Response: N/A N/A N/A Post Debridement Measurements L x N/A N/A N/A W x D (cm) N/A N/A N/A Post Debridement Volume: (cm) N/A N/A N/A Post Debridement Stage: N/A N/A N/A Procedures Performed: Treatment Notes Electronic Signature(s) Signed: 05/21/2022 2:59:03 PM By: 05/23/2022 MD FACS Signed: 05/21/2022 5:20:46 PM By: 05/23/2022 RN, BSN Entered By: Zenaida Deed on 05/21/2022 14:59:02 -------------------------------------------------------------------------------- Multi-Disciplinary Care Plan Details Patient Name: Date of Service: 05/23/2022 [place] 05/21/2022 2:00 PM Medical Record Number: 05/23/2022 Patient Account Number:  [id number] Date of Birth/Sex: Treating RN: 02/24/1991 (30 y.o. 08/24/1991 Primary Care Ines Warf: Tommye Standard Other Clinician: Referring Nathan Stallworth: Treating Sonnia Strong/Extender: Lynnea Ferrier in Treatment: 1 Multidisciplinary Care Plan reviewed with physician Active Inactive Nutrition Nursing Diagnoses: Imbalanced nutrition Potential for alteratiion in Nutrition/Potential for imbalanced nutrition Goals: Patient/caregiver agrees to and verbalizes understanding of need to use nutritional supplements and/or vitamins as prescribed Date Initiated: 05/14/2022 Target Resolution Date: 06/11/2022 Goal Status: Active Interventions: Assess patient nutrition upon admission and as needed per policy Provide education on nutrition Treatment Activities: Dietary management education, guidance and counseling : 05/14/2022 Education provided on Nutrition : 05/14/2022 Patient referred to Primary Care Physician for further nutritional evaluation : 05/14/2022 Notes: Pressure Nursing Diagnoses: Knowledge deficit related to causes and risk factors for  pressure ulcer development Knowledge deficit related to management of pressures ulcers Goals: Patient will remain free from development of additional pressure ulcers Date Initiated: 05/14/2022 Target Resolution Date: 06/11/2022 Goal Status: Active Patient/caregiver will verbalize understanding of pressure ulcer management Date Initiated: 05/14/2022 Target Resolution Date: 06/11/2022 Goal Status: Active Interventions: Assess: immobility, friction, shearing, incontinence upon admission and as needed Assess offloading mechanisms upon admission and as needed Assess potential for pressure ulcer upon admission and as needed Treatment Activities: Patient referred for home evaluation of offloading devices/mattresses : 05/14/2022 Pressure reduction/relief device ordered : 05/14/2022 Notes: Wound/Skin Impairment Nursing  Diagnoses: Impaired tissue integrity Knowledge deficit related to ulceration/compromised skin integrity Goals: Patient/caregiver will verbalize understanding of skin care regimen Date Initiated: 05/14/2022 Target Resolution Date: 06/11/2022 Goal Status: Active Ulcer/skin breakdown will have a volume reduction of 30% by week 4 Date Initiated: 05/14/2022 Target Resolution Date: 06/11/2022 Goal Status: Active Interventions: Assess patient/caregiver ability to obtain necessary supplies Assess patient/caregiver ability to perform ulcer/skin care regimen upon admission and as needed Assess ulceration(s) every visit Provide education on ulcer and skin care Treatment Activities: Skin care regimen initiated : 05/14/2022 Topical wound management initiated : 05/14/2022 Notes: Electronic Signature(s) Signed: 05/21/2022 5:20:46 PM By: Zenaida Deed RN, BSN Entered By: Zenaida Deed on 05/21/2022 14:48:29 -------------------------------------------------------------------------------- Pain Assessment Details Patient Name: Date of Service: [place] N. 05/21/2022 2:00 PM Medical Record Number: [id number] Patient Account Number: [id number] Date of Birth/Sex: Treating RN: 1990-11-30 (31 y.o. Tommye Standard Primary Care Shamira Toutant: Lynnea Ferrier Other Clinician: Referring Rebekah Sprinkle: Treating Lillyana Majette/Extender: Suzan Garibaldi in Treatment: 1 Active Problems Location of Pain Severity and Description of Pain Patient Has Paino Yes Site Locations Pain Location: Pain in Ulcers With Dressing Change: Yes Duration of the Pain. Constant / Intermittento Constant Rate the pain. Current Pain Level: 8 Worst Pain Level: 9 Least Pain Level: 2 Character of Pain Describe the Pain: Aching Pain Management and Medication Current Pain Management: Medication: Yes Other: reposition Is the Current Pain Management Adequate: Adequate How does your wound impact your  activities of daily livingo Sleep: No Bathing: No Appetite: No Relationship With Others: No Bladder Continence: No Emotions: No Bowel Continence: No Work: No Toileting: No Drive: No Dressing: No Hobbies: No Psychologist, prison and probation services) Signed: 05/21/2022 5:20:46 PM By: Zenaida Deed RN, BSN Entered By: Zenaida Deed on 05/21/2022 14:16:39 -------------------------------------------------------------------------------- Patient/Caregiver Education Details Patient Name: Date of Service: RO Frederico Hamman 9/25/2023andnbsp2:00 PM Medical Record Number: [id number] Patient Account Number: [id number] Date of Birth/Gender: Treating RN: 1990/11/16 (31 y.o. Tommye Standard Primary Care Physician: Lynnea Ferrier Other Clinician: Referring Physician: Treating Physician/Extender: Suzan Garibaldi in Treatment: 1 Education Assessment Education Provided To: Patient Education Topics Provided Pressure: Methods: Explain/Verbal Responses: Reinforcements needed, State content correctly Wound/Skin Impairment: Methods: Explain/Verbal Responses: Reinforcements needed, State content correctly Electronic Signature(s) Signed: 05/21/2022 5:20:46 PM By: Zenaida Deed RN, BSN Entered By: Zenaida Deed on 05/21/2022 14:48:51 -------------------------------------------------------------------------------- Wound Assessment Details Patient Name: Date of Service: [place] N. 05/21/2022 2:00 PM Medical Record Number: [id number] Patient Account Number: [id number] Date of Birth/Sex: Treating RN: Jun 27, 1991 (30 y.o. Tommye Standard Primary Care Sheilla Maris: Lynnea Ferrier Other Clinician: Referring Joretta Eads: Treating Dashaun Onstott/Extender: Suzan Garibaldi in Treatment: 1 Wound Status Wound Number: 1 Primary Pressure Ulcer Etiology: Wound Location: Right Trochanter Wound Status: Open Wounding Event: Pressure Injury Comorbid Deep  Vein Thrombosis, Quadriplegia, Seizure Disorder, Date Acquired: 01/25/2022 History: Anorexia/bulimia Weeks Of Treatment: 1 Clustered  Wound: Yes Photos Wound Measurements Length: (cm) 7.5 Width: (cm) 4.6 Depth: (cm) 1.1 Area: (cm) 27.096 Volume: (cm) 29.806 % Reduction in Area: 24.6% % Reduction in Volume: 7.8% Epithelialization: None Tunneling: No Undermining: Yes Starting Position (o'clock): 6 Ending Position (o'clock): 11 Maximum Distance: (cm) 1.7 Wound Description Classification: Category/Stage IV Wound Margin: Well defined, not attached Exudate Amount: Large Exudate Type: Serosanguineous Exudate Color: red, brown Foul Odor After Cleansing: No Slough/Fibrino Yes Wound Bed Granulation Amount: Medium (34-66%) Exposed Structure Granulation Quality: Red Fascia Exposed: No Necrotic Amount: Medium (34-66%) Fat Layer (Subcutaneous Tissue) Exposed: Yes Necrotic Quality: Adherent Slough Tendon Exposed: No Muscle Exposed: Yes Necrosis of Muscle: No Joint Exposed: No Bone Exposed: Yes Electronic Signature(s) Signed: 05/21/2022 5:20:46 PM By: Zenaida DeedBoehlein, Linda RN, BSN Entered By: Zenaida DeedBoehlein, Linda on 05/21/2022 14:42:14 -------------------------------------------------------------------------------- Wound Assessment Details Patient Name: Date of Service: [name] 05/21/2022 2:00 PM Medical Record Number: [id number] Patient Account Number: [id number] Date of Birth/Sex: Treating RN: 25-Oct-1990 (30 y.o. Tommye StandardF) Boehlein, Linda Primary Care Kodie Kishi: Lynnea FerrierPickard, Warren Other Clinician: Referring Devri Kreher: Treating Maris Abascal/Extender: Suzan Garibaldiannon, Jennifer Pickard, Warren Weeks in Treatment: 1 Wound Status Wound Number: 2 Primary Pressure Ulcer Etiology: Wound Location: Right Back Wound Status: Open Wounding Event: Pressure Injury Comorbid Deep Vein Thrombosis, Quadriplegia, Seizure Disorder, Date Acquired: 01/25/2022 History: Anorexia/bulimia Weeks Of Treatment:  1 Clustered Wound: Yes Photos Wound Measurements Length: (cm) 9 Width: (cm) 8.6 Depth: (cm) 0.1 Clustered Quantity: 4 Area: (cm) 60.79 Volume: (cm) 6.079 % Reduction in Area: -38.2% % Reduction in Volume: -38.2% Epithelialization: Small (1-33%) Tunneling: No Undermining: No Wound Description Classification: Unstageable/Unclassified Wound Margin: Flat and Intact Exudate Amount: Medium Exudate Type: Serous Exudate Color: amber Foul Odor After Cleansing: No Slough/Fibrino Yes Wound Bed Granulation Amount: Small (1-33%) Exposed Structure Granulation Quality: Red Fascia Exposed: No Necrotic Amount: Large (67-100%) Fat Layer (Subcutaneous Tissue) Exposed: Yes Necrotic Quality: Eschar, Adherent Slough Tendon Exposed: No Muscle Exposed: No Joint Exposed: No Bone Exposed: No Electronic Signature(s) Signed: 05/21/2022 5:20:46 PM By: Zenaida DeedBoehlein, Linda RN, BSN Entered By: Zenaida DeedBoehlein, Linda on 05/21/2022 14:42:44 -------------------------------------------------------------------------------- Wound Assessment Details Patient Name: Date of Service: [name] 05/21/2022 2:00 PM Medical Record Number: [id number] Patient Account Number: [id number] Date of Birth/Sex: Treating RN: 25-Oct-1990 (30 y.o. Tommye StandardF) Boehlein, Linda Primary Care Laquesha Holcomb: Lynnea FerrierPickard, Warren Other Clinician: Referring Kellen Hover: Treating Jkwon Treptow/Extender: Suzan Garibaldiannon, Jennifer Pickard, Warren Weeks in Treatment: 1 Wound Status Wound Number: 3 Primary Pressure Ulcer Etiology: Wound Location: Right Gluteus Wound Status: Open Wounding Event: Pressure Injury Comorbid Deep Vein Thrombosis, Quadriplegia, Seizure Disorder, Date Acquired: 01/25/2022 History: Anorexia/bulimia Weeks Of Treatment: 1 Clustered Wound: No Photos Wound Measurements Length: (cm) 1.8 Width: (cm) 4.1 Depth: (cm) 0.1 Area: (cm) 5.796 Volume: (cm) 0.58 % Reduction in Area: 58.7% % Reduction in Volume: 58.7% Epithelialization:  Small (1-33%) Tunneling: No Undermining: No Wound Description Classification: Category/Stage III Wound Margin: Flat and Intact Exudate Amount: Medium Exudate Type: Serosanguineous Exudate Color: red, brown Foul Odor After Cleansing: No Slough/Fibrino Yes Wound Bed Granulation Amount: Medium (34-66%) Exposed Structure Granulation Quality: Red Fascia Exposed: No Necrotic Amount: Medium (34-66%) Fat Layer (Subcutaneous Tissue) Exposed: Yes Necrotic Quality: Adherent Slough Tendon Exposed: No Muscle Exposed: No Joint Exposed: No Bone Exposed: No Electronic Signature(s) Signed: 05/21/2022 5:20:46 PM By: Zenaida DeedBoehlein, Linda RN, BSN Entered By: Zenaida DeedBoehlein, Linda on 05/21/2022 14:43:22 -------------------------------------------------------------------------------- Wound Assessment Details Patient Name: Date of Service: [name] 05/21/2022 2:00 PM Medical Record Number: [id number] Patient Account Number: [id number] Date of  Birth/Sex: Treating RN: Mar 02, 1991 (30 y.o. Tommye Standard Primary Care Fayola Meckes: Lynnea Ferrier Other Clinician: Referring Brennin Durfee: Treating Bellarae Lizer/Extender: Suzan Garibaldi in Treatment: 1 Wound Status Wound Number: 4 Primary Pressure Ulcer Etiology: Wound Location: Right, Lateral Upper Leg Wound Status: Open Wounding Event: Pressure Injury Comorbid Deep Vein Thrombosis, Quadriplegia, Seizure Disorder, Date Acquired: 05/21/2022 History: Anorexia/bulimia Weeks Of Treatment: 0 Clustered Wound: No Photos Wound Measurements Length: (cm) 1.7 Width: (cm) 2.4 Depth: (cm) 0.1 Area: (cm) 3.204 Volume: (cm) 0.32 % Reduction in Area: % Reduction in Volume: Epithelialization: Medium (34-66%) Tunneling: No Undermining: No Wound Description Classification: Category/Stage III Wound Margin: Flat and Intact Exudate Amount: Small Exudate Type: Serosanguineous Exudate Color: red, brown Foul Odor After Cleansing:  No Slough/Fibrino No Wound Bed Granulation Amount: Large (67-100%) Exposed Structure Granulation Quality: Red Fascia Exposed: No Necrotic Amount: None Present (0%) Fat Layer (Subcutaneous Tissue) Exposed: Yes Tendon Exposed: No Muscle Exposed: No Joint Exposed: No Bone Exposed: No Electronic Signature(s) Signed: 05/21/2022 5:20:46 PM By: Zenaida Deed RN, BSN Entered By: Zenaida Deed on 05/21/2022 14:43:44 -------------------------------------------------------------------------------- Vitals Details Patient Name: Date of Service: [place] N. 05/21/2022 2:00 PM Medical Record Number: [id number] Patient Account Number: [id number] Date of Birth/Sex: Treating RN: August 17, 1991 (30 y.o. Billy Coast, Linda Primary Care Solomon Skowronek: Lynnea Ferrier Other Clinician: Referring Kortlynn Poust: Treating Abi Shoults/Extender: Suzan Garibaldi in Treatment: 1 Vital Signs Time Taken: 14:14 Temperature (F): 99.3 Weight (lbs): 70 Pulse (bpm): 130 Respiratory Rate (breaths/min): 18 Blood Pressure (mmHg): 101/70 Reference Range: 80 - 120 mg / dl Electronic Signature(s) Signed: 05/21/2022 5:20:46 PM By: Zenaida Deed RN, BSN Entered By: Zenaida Deed on 05/21/2022 14:15:19

## 2022-05-21 NOTE — Progress Notes (Signed)
Ewell PoeROBINSON, Janari N. ([id number]) Visit Report for 05/21/2022 Chief Complaint Document Details Patient Name: Date of Service: Nancy Rolland PorterBINSO N, [name] 05/21/2022 2:00 PM Medical Record Number: [id number] Patient Account Number: [id number] Date of Birth/Sex: Treating RN: April 07, 1991 (31 y.o. Nancy [name]) Boehlein, Linda Primary Care Provider: Lynnea FerrierPickard, Warren Other Clinician: Referring Provider: Treating Provider/Extender: Suzan Garibaldiannon, Osaze Hubbert Pickard, Warren Weeks in Treatment: 1 Information Obtained from: Patient Chief Complaint Patient is at the clinic for treatment of multiple open pressure ulcers Electronic Signature(s) Signed: 05/21/2022 2:59:08 PM By: Duanne Guessannon, Nakeshia Waldeck MD FACS Entered By: Duanne Guessannon, Alexus Michael on 05/21/2022 14:59:08 -------------------------------------------------------------------------------- Debridement Details Patient Name: Date of Service: [name], [name] 05/21/2022 2:00 PM Medical Record Number: [id number] Patient Account Number: [id number] Date of Birth/Sex: Treating RN: April 07, 1991 (31 y.o. Nancy [name]) Boehlein, Linda Primary Care Provider: Lynnea FerrierPickard, Warren Other Clinician: Referring Provider: Treating Provider/Extender: Suzan Garibaldiannon, Omari Koslosky Pickard, Warren Weeks in Treatment: 1 Debridement Performed for Assessment: Wound #2 Right Back Performed By: Physician Duanne Guessannon, Christopher Hink, MD Debridement Type: Debridement Level of Consciousness (Pre-procedure): Awake and Alert Pre-procedure Verification/Time Out Yes - 14:50 Taken: Start Time: 14:52 Pain Control: Lidocaine 4% T opical Solution T Area Debrided (L x W): otal 7 (cm) x 4 (cm) = 28 (cm) Tissue and other material debrided: Non-Viable, Slough, Slough Level: Non-Viable Tissue Debridement Description: Selective/Open Wound Instrument: Curette Bleeding: None Hemostasis Achieved: Pressure Procedural Pain: 2 Post Procedural Pain: 0 Response to Treatment: Procedure was tolerated well Level of Consciousness (Post- Awake and  Alert procedure): Post Debridement Measurements of Total Wound Length: (cm) 9 Stage: Unstageable/Unclassified Width: (cm) 8.6 Depth: (cm) 0.1 Volume: (cm) 6.079 Character of Wound/Ulcer Post Debridement: Requires Further Debridement Post Procedure Diagnosis Same as Pre-procedure Electronic Signature(s) Signed: 05/21/2022 4:13:56 PM By: Duanne Guessannon, Romelle Reiley MD FACS Signed: 05/21/2022 5:20:46 PM By: Zenaida DeedBoehlein, Linda RN, BSN Entered By: Zenaida DeedBoehlein, Linda on 05/21/2022 14:54:21 -------------------------------------------------------------------------------- Debridement Details Patient Name: Date of Service: [name], [name] 05/21/2022 2:00 PM Medical Record Number: [id number] Patient Account Number: [id number] Date of Birth/Sex: Treating RN: April 07, 1991 (31 y.o. Nancy [name]) Boehlein, Linda Primary Care Provider: Lynnea FerrierPickard, Warren Other Clinician: Referring Provider: Treating Provider/Extender: Suzan Garibaldiannon, Priscila Bean Pickard, Warren Weeks in Treatment: 1 Debridement Performed for Assessment: Wound #3 Right Gluteus Performed By: Physician Duanne Guessannon, Brean Carberry, MD Debridement Type: Debridement Level of Consciousness (Pre-procedure): Awake and Alert Pre-procedure Verification/Time Out Yes - 14:50 Taken: Start Time: 14:52 Pain Control: Lidocaine 4% T opical Solution T Area Debrided (L x W): otal 1.8 (cm) x 4.1 (cm) = 7.38 (cm) Tissue and other material debrided: Non-Viable, Slough, Slough Level: Non-Viable Tissue Debridement Description: Selective/Open Wound Instrument: Curette Bleeding: None Hemostasis Achieved: Pressure Procedural Pain: 2 Post Procedural Pain: 0 Response to Treatment: Procedure was tolerated well Level of Consciousness (Post- Awake and Alert procedure): Post Debridement Measurements of Total Wound Length: (cm) 1.8 Stage: Category/Stage III Width: (cm) 4.1 Depth: (cm) 0.1 Volume: (cm) 0.58 Character of Wound/Ulcer Post Debridement: Requires Further Debridement Post  Procedure Diagnosis Same as Pre-procedure Notes scribed by L. Boehlein, RN for Dr. Lady Garyannon Electronic Signature(s) Signed: 05/21/2022 4:13:56 PM By: Duanne Guessannon, Anamika Kueker MD FACS Signed: 05/21/2022 5:20:46 PM By: Zenaida DeedBoehlein, Linda RN, BSN Entered By: Zenaida DeedBoehlein, Linda on 05/21/2022 14:55:33 -------------------------------------------------------------------------------- Debridement Details Patient Name: Date of Service: [name], [name] 05/21/2022 2:00 PM Medical Record Number: [id number] Patient Account Number: [id number] Date of Birth/Sex: Treating RN: April 07, 1991 (31 y.o. Nancy [name]) Boehlein, Linda Primary Care Provider: Other Clinician: Lynnea FerrierPickard, Warren Referring Provider: Treating Provider/Extender: Suzan Garibaldiannon, Yoshi Mancillas Pickard, Warren Weeks in Treatment: 1 Debridement  Performed for Assessment: Wound #1 Right Trochanter Performed By: Physician Duanne Guess, MD Debridement Type: Debridement Level of Consciousness (Pre-procedure): Awake and Alert Pre-procedure Verification/Time Out Yes - 14:50 Taken: Start Time: 14:52 Pain Control: Lidocaine 4% T opical Solution T Area Debrided (L x W): otal 7.5 (cm) x 4.6 (cm) = 34.5 (cm) Tissue and other material debrided: Non-Viable, Slough, Skin: Dermis , Biofilm, Slough Level: Skin/Dermis Debridement Description: Selective/Open Wound Instrument: Curette, Forceps, Scissors Bleeding: None Hemostasis Achieved: Pressure Procedural Pain: 5 Post Procedural Pain: 0 Response to Treatment: Procedure was tolerated well Level of Consciousness (Post- Awake and Alert procedure): Post Debridement Measurements of Total Wound Length: (cm) 7.5 Stage: Category/Stage IV Width: (cm) 4.6 Depth: (cm) 1.1 Volume: (cm) 29.806 Character of Wound/Ulcer Post Debridement: Requires Further Debridement Post Procedure Diagnosis Same as Pre-procedure Notes scribed by L. Boehlein, RN for Dr. Lady Gary Electronic Signature(s) Signed: 05/21/2022 4:13:56 PM By: Duanne Guess MD FACS Signed: 05/21/2022 5:20:46 PM By: Zenaida Deed RN, BSN Entered By: Zenaida Deed on 05/21/2022 14:56:52 -------------------------------------------------------------------------------- HPI Details Patient Name: Date of Service: Red Christians [place] N. 05/21/2022 2:00 PM Medical Record Number: [id number] Patient Account Number: [id number] Date of Birth/Sex: Treating RN: [birth year] (30 y.o. Nancy Standard Primary Care Provider: Lynnea Ferrier Other Clinician: Referring Provider: Treating Provider/Extender: Suzan Garibaldi in Treatment: 1 History of Present Illness HPI Description: ADMISSION 05/14/2022 This is a 31 year old woman with spastic quadriplegic cerebral palsy, seizure disorder, and severe protein calorie malnutrition. She is brought to clinic today by her care provider. Apparently the patient was previously living with her father but he has become elderly and unable to manage her needs. She has been moved from various group homes and is now been in her current situation since July. Her care provider states that she had multiple ulcers in July but had no history of how long they have been present or any wound care instructions. The patient does not have an appropriate mattress and spends much of her time sitting in her wheelchair. Due to her contractures and contortion of her body, there is significant pressure applied to her right side. She is here today with multiple pressure ulcers in a line running down from her right back and involving her right trochanter and right ischium. On her right upper back, there are 3 adjacent ulcers. 2 of them have very eschar on them and are unstageable. The third site appears to be stage II with just breakdown of skin. It is very dry. There is a stage IV ulcer on her trochanter. Bone is palpable and a small area is exposed. There is some slough around the margin as well as an area with the same leathery  eschar that is on her back. On her gluteus, there is a stage III ulcer. It, too, is a little bit dry and there is leathery eschar adjacent to the open portion. 05/21/2022 the wounds on her right upper back are very fibrotic with pale leathery eschar and slough. The trochanter wound is cleaner this week but there is more bone exposed. The gluteal ulcer is healing but still has an area with thick fibrinous adherent exudate. On her left upper thigh, there was an area last week it looks like deep tissue injury which has now opened up, but it is clean and fairly superficial. Electronic Signature(s) Signed: 05/21/2022 3:00:40 PM By: Duanne Guess MD FACS Entered By: Duanne Guess on 05/21/2022 15:00:40 -------------------------------------------------------------------------------- Physical Exam Details Patient Name: Date of Service: Nancy BINSO  Corene Cornea [place] N. 05/21/2022 2:00 PM Medical Record Number: [id number] Patient Account Number: [id number] Date of Birth/Sex: Treating RN: November 09, 1990 (30 y.o. Nancy Standard Primary Care Provider: Lynnea Ferrier Other Clinician: Referring Provider: Treating Provider/Extender: Suzan Garibaldi in Treatment: 1 Constitutional .Tachycardic. . . No acute distress.Marland Kitchen Respiratory Normal work of breathing on room air.. Notes 05/21/2022: The wounds on her right upper back are very fibrotic with pale leathery eschar and slough. The trochanter wound is cleaner this week but there is more bone exposed. The gluteal ulcer is healing but still has an area with thick fibrinous adherent exudate. On her left upper thigh, there was an area last week that looked like deep tissue injury which has now opened up, but it is clean and fairly superficial. Electronic Signature(s) Signed: 05/21/2022 3:01:53 PM By: Duanne Guess MD FACS Entered By: Duanne Guess on 05/21/2022  15:01:52 -------------------------------------------------------------------------------- Physician Orders Details Patient Name: Date of Service: Red Christians [place] N. 05/21/2022 2:00 PM Medical Record Number: [id number] Patient Account Number: [id number] Date of Birth/Sex: Treating RN: May 02, 1991 (30 y.o. Nancy Standard Primary Care Provider: Lynnea Ferrier Other Clinician: Referring Provider: Treating Provider/Extender: Suzan Garibaldi in Treatment: 1 Verbal / Phone Orders: No Diagnosis Coding ICD-10 Coding Code Description 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, stage 4 L89.100 Pressure ulcer of unspecified part of back, unstageable L89.313 Pressure ulcer of right buttock, stage 3 R63.0 Anorexia E43 Unspecified severe protein-calorie malnutrition Follow-up Appointments ppointment in 1 week. - Dr. Lady Gary RM 1 with Marquis Lunch*** Return A Anesthetic Wound #1 Right Trochanter (In clinic) Topical Lidocaine 4% applied to wound bed Wound #2 Right Back (In clinic) Topical Lidocaine 4% applied to wound bed Wound #3 Right Gluteus (In clinic) Topical Lidocaine 4% applied to wound bed Bathing/ Shower/ Hygiene May shower and wash wound with soap and water. - with dressing changes Off-Loading Low air-loss mattress (Group 2) - and hospital bed, ordered from [employer] Turn and reposition every 2 hours - may be up in wheelchair for meals Other: - use foam with cut out to offload right side while in the wheelchair Home Health Dressing changes to be completed by Home Health on Monday / Wednesday / Friday except when patient has scheduled visit at [facility] Other Home Health Orders/Instructions: - [facility] Wound Treatment Wound #1 - Trochanter Wound Laterality: Right Prim Dressing: Santyl Ointment 1 x Per Day/30 Days ary Discharge Instructions: Apply nickel thick amount to wound edge Prim Dressing:  Dakin's Solution 0.125%, 16 (oz) 1 x Per Day/30 Days ary Discharge Instructions: Moisten gauze with Dakin's solution and pack lightly into wound Secondary Dressing: Woven Gauze Sponge, Non-Sterile 4x4 in (Generic) 1 x Per Day/30 Days Discharge Instructions: Apply over primary dressing as directed. Secondary Dressing: Zetuvit Plus Silicone Border Dressing 7x7(in/in) (Generic) 1 x Per Day/30 Days Discharge Instructions: Apply silicone border over primary dressing as directed. Wound #2 - Back Wound Laterality: Right Prim Dressing: Santyl Ointment 1 x Per Day/30 Days ary Discharge Instructions: Apply nickel thick amount to wound bed as instructed Secondary Dressing: Zetuvit Plus Silicone Border Dressing 7x7(in/in) (Generic) 1 x Per Day/30 Days Discharge Instructions: Apply silicone border over primary dressing as directed. Wound #3 - Gluteus Wound Laterality: Right Prim Dressing: Santyl Ointment 1 x Per Day/30 Days ary Discharge Instructions: Apply nickel thick amount to wound bed as instructed Secondary Dressing: Zetuvit Plus Silicone Border Dressing 4x4 (in/in) (Generic) 1 x Per Day/30 Days  Discharge Instructions: Apply silicone border over primary dressing as directed. Wound #4 - Upper Leg Wound Laterality: Right, Lateral Prim Dressing: KerraCel Ag Gelling Fiber Dressing, 2x2 in (silver alginate) 3 x Per Week/30 Days ary Discharge Instructions: Apply silver alginate to wound bed as instructed Secondary Dressing: Zetuvit Plus Silicone Border Dressing 4x4 (in/in) 3 x Per Week/30 Days Discharge Instructions: Apply silicone border over primary dressing as directed. Patient Medications llergies: No Known Allergies A Notifications Medication Indication Start End prior to debridement 05/21/2022 lidocaine DOSE topical 4 % cream - cream topical Electronic Signature(s) Signed: 05/21/2022 4:13:56 PM By: Duanne Guess MD FACS Entered By: Duanne Guess on 05/21/2022  15:02:06 -------------------------------------------------------------------------------- Problem List Details Patient Name: Date of Service: Red Christians [place] N. 05/21/2022 2:00 PM Medical Record Number: [id number] Patient Account Number: [id number] Date of Birth/Sex: Treating RN: 1991/01/26 (30 y.o. Nancy Standard Primary Care Provider: Lynnea Ferrier Other Clinician: Referring Provider: Treating Provider/Extender: Suzan Garibaldi in Treatment: 1 Active Problems ICD-10 Encounter Code Description Active Date MDM Diagnosis 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 05/14/2022 No Yes 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, stage 4 05/14/2022 No Yes L89.100 Pressure ulcer of unspecified part of back, unstageable 05/14/2022 No Yes L89.313 Pressure ulcer of right buttock, stage 3 05/14/2022 No Yes R63.0 Anorexia 05/14/2022 No Yes E43 Unspecified severe protein-calorie malnutrition 05/14/2022 No Yes Inactive Problems Resolved Problems Electronic Signature(s) Signed: 05/21/2022 2:58:48 PM By: Duanne Guess MD FACS Entered By: Duanne Guess on 05/21/2022 14:58:47 -------------------------------------------------------------------------------- Progress Note Details Patient Name: Date of Service: Red Christians [place] N. 05/21/2022 2:00 PM Medical Record Number: [id number] Patient Account Number: [id number] Date of Birth/Sex: Treating RN: Jun 30, 1991 (30 y.o. Nancy Standard Primary Care Provider: Lynnea Ferrier Other Clinician: Referring Provider: Treating Provider/Extender: Suzan Garibaldi in Treatment: 1 Subjective Chief Complaint Information obtained from Patient Patient is at the clinic for treatment of multiple open pressure ulcers History of Present Illness (HPI) ADMISSION 05/14/2022 This is a 31 year old woman with spastic quadriplegic cerebral palsy, seizure disorder, and severe protein calorie malnutrition. She is  brought to clinic today by her care provider. Apparently the patient was previously living with her father but he has become elderly and unable to manage her needs. She has been moved from various group homes and is now been in her current situation since July. Her care provider states that she had multiple ulcers in July but had no history of how long they have been present or any wound care instructions. The patient does not have an appropriate mattress and spends much of her time sitting in her wheelchair. Due to her contractures and contortion of her body, there is significant pressure applied to her right side. She is here today with multiple pressure ulcers in a line running down from her right back and involving her right trochanter and right ischium. On her right upper back, there are 3 adjacent ulcers. 2 of them have very eschar on them and are unstageable. The third site appears to be stage II with just breakdown of skin. It is very dry. There is a stage IV ulcer on her trochanter. Bone is palpable and a small area is exposed. There is some slough around the margin as well as an area with the same leathery eschar that is on her back. On her gluteus, there is a stage III ulcer. It, too, is a little bit dry and there is leathery eschar adjacent to the open portion. 05/21/2022 the wounds  on her right upper back are very fibrotic with pale leathery eschar and slough. The trochanter wound is cleaner this week but there is more bone exposed. The gluteal ulcer is healing but still has an area with thick fibrinous adherent exudate. On her left upper thigh, there was an area last week it looks like deep tissue injury which has now opened up, but it is clean and fairly superficial. Patient History Information obtained from Patient, Caregiver, Chart. Family History Cancer - Mother, Diabetes - Father, Heart Disease - Paternal Grandparents, No family history of Hereditary Spherocytosis, Hypertension,  Kidney Disease, Lung Disease, Seizures, Stroke, Thyroid Problems, Tuberculosis. Social History Never smoker, Marital Status - Single, Alcohol Use - Never, Drug Use - No History, Caffeine Use - Never. Medical History Eyes Denies history of Cataracts, Glaucoma, Optic Neuritis Ear/Nose/Mouth/Throat Denies history of Chronic sinus problems/congestion, Middle ear problems Cardiovascular Patient has history of Deep Vein Thrombosis - bil Endocrine Denies history of Type I Diabetes, Type II Diabetes Genitourinary Denies history of End Stage Renal Disease Integumentary (Skin) Denies history of History of Burn Neurologic Patient has history of Quadriplegia, Seizure Disorder - epilepsy Oncologic Denies history of Received Chemotherapy, Received Radiation Psychiatric Patient has history of Anorexia/bulimia Denies history of Confinement Anxiety Hospitalization/Surgery History - IUD insertion. - hip surgery right. Medical A Surgical History Notes nd Genitourinary incontinence Musculoskeletal contractures, scoliosis Neurologic cerebral palsy Psychiatric mental retardation Objective Constitutional Tachycardic. No acute distress.. Vitals Time Taken: 2:14 PM, Weight: 70 lbs, Temperature: 99.3 F, Pulse: 130 bpm, Respiratory Rate: 18 breaths/min, Blood Pressure: 101/70 mmHg. Respiratory Normal work of breathing on room air.. General Notes: 05/21/2022: The wounds on her right upper back are very fibrotic with pale leathery eschar and slough. The trochanter wound is cleaner this week but there is more bone exposed. The gluteal ulcer is healing but still has an area with thick fibrinous adherent exudate. On her left upper thigh, there was an area last week that looked like deep tissue injury which has now opened up, but it is clean and fairly superficial. Integumentary (Hair, Skin) Wound #1 status is Open. Original cause of wound was Pressure Injury. The date acquired was: 01/25/2022. The wound  has been in treatment 1 weeks. The wound is located on the Right Trochanter. The wound measures 7.5cm length x 4.6cm width x 1.1cm depth; 27.096cm^2 area and 29.806cm^3 volume. There is bone, muscle, and Fat Layer (Subcutaneous Tissue) exposed. There is no tunneling noted, however, there is undermining starting at 6:00 and ending at 11:00 with a maximum distance of 1.7cm. There is a large amount of serosanguineous drainage noted. The wound margin is well defined and not attached to the wound base. There is medium (34-66%) red granulation within the wound bed. There is a medium (34-66%) amount of necrotic tissue within the wound bed including Adherent Slough. Wound #2 status is Open. Original cause of wound was Pressure Injury. The date acquired was: 01/25/2022. The wound has been in treatment 1 weeks. The wound is located on the Right Back. The wound measures 9cm length x 8.6cm width x 0.1cm depth; 60.79cm^2 area and 6.079cm^3 volume. There is Fat Layer (Subcutaneous Tissue) exposed. There is no tunneling or undermining noted. There is a medium amount of serous drainage noted. The wound margin is flat and intact. There is small (1-33%) red granulation within the wound bed. There is a large (67-100%) amount of necrotic tissue within the wound bed including Eschar and Adherent Slough. Wound #3 status is Open. Original cause  of wound was Pressure Injury. The date acquired was: 01/25/2022. The wound has been in treatment 1 weeks. The wound is located on the Right Gluteus. The wound measures 1.8cm length x 4.1cm width x 0.1cm depth; 5.796cm^2 area and 0.58cm^3 volume. There is Fat Layer (Subcutaneous Tissue) exposed. There is no tunneling or undermining noted. There is a medium amount of serosanguineous drainage noted. The wound margin is flat and intact. There is medium (34-66%) red granulation within the wound bed. There is a medium (34-66%) amount of necrotic tissue within the wound bed including Adherent  Slough. Wound #4 status is Open. Original cause of wound was Pressure Injury. The date acquired was: 05/21/2022. The wound is located on the Right,Lateral Upper Leg. The wound measures 1.7cm length x 2.4cm width x 0.1cm depth; 3.204cm^2 area and 0.32cm^3 volume. There is Fat Layer (Subcutaneous Tissue) exposed. There is no tunneling or undermining noted. There is a small amount of serosanguineous drainage noted. The wound margin is flat and intact. There is large (67-100%) red granulation within the wound bed. There is no necrotic tissue within the wound bed. Assessment Active Problems ICD-10 Spastic quadriplegic cerebral palsy Pressure ulcer of right hip, stage 4 Pressure ulcer of unspecified part of back, unstageable Pressure ulcer of right buttock, stage 3 Anorexia Unspecified severe protein-calorie malnutrition Procedures Wound #1 Pre-procedure diagnosis of Wound #1 is a Pressure Ulcer located on the Right Trochanter . There was a Selective/Open Wound Skin/Dermis Debridement with a total area of 34.5 sq cm performed by Duanne Guess, MD. With the following instrument(s): Curette, Forceps, and Scissors to remove Non-Viable tissue/material. Material removed includes Slough, Skin: Dermis, and Biofilm after achieving pain control using Lidocaine 4% Topical Solution. No specimens were taken. A time out was conducted at 14:50, prior to the start of the procedure. There was no bleeding. The procedure was tolerated well with a pain level of 5 throughout and a pain level of 0 following the procedure. Post Debridement Measurements: 7.5cm length x 4.6cm width x 1.1cm depth; 29.806cm^3 volume. Post debridement Stage noted as Category/Stage IV. Character of Wound/Ulcer Post Debridement requires further debridement. Post procedure Diagnosis Wound #1: Same as Pre-Procedure General Notes: scribed by L. Boehlein, RN for Dr. Lady Gary. Wound #2 Pre-procedure diagnosis of Wound #2 is a Pressure Ulcer  located on the Right Back . There was a Selective/Open Wound Non-Viable Tissue Debridement with a total area of 28 sq cm performed by Duanne Guess, MD. With the following instrument(s): Curette to remove Non-Viable tissue/material. Material removed includes [facility] after achieving pain control using Lidocaine 4% Topical Solution. No specimens were taken. A time out was conducted at 14:50, prior to the start of the procedure. There was no bleeding. The procedure was tolerated well with a pain level of 2 throughout and a pain level of 0 following the procedure. Post Debridement Measurements: 9cm length x 8.6cm width x 0.1cm depth; 6.079cm^3 volume. Post debridement Stage noted as Unstageable/Unclassified. Character of Wound/Ulcer Post Debridement requires further debridement. Post procedure Diagnosis Wound #2: Same as Pre-Procedure Wound #3 Pre-procedure diagnosis of Wound #3 is a Pressure Ulcer located on the Right Gluteus . There was a Selective/Open Wound Non-Viable Tissue Debridement with a total area of 7.38 sq cm performed by Duanne Guess, MD. With the following instrument(s): Curette to remove Non-Viable tissue/material. Material removed includes [facility] after achieving pain control using Lidocaine 4% T opical Solution. No specimens were taken. A time out was conducted at 14:50, prior to the start of the procedure. There  was no bleeding. The procedure was tolerated well with a pain level of 2 throughout and a pain level of 0 following the procedure. Post Debridement Measurements: 1.8cm length x 4.1cm width x 0.1cm depth; 0.58cm^3 volume. Post debridement Stage noted as Category/Stage III. Character of Wound/Ulcer Post Debridement requires further debridement. Post procedure Diagnosis Wound #3: Same as Pre-Procedure General Notes: scribed by L. Boehlein, RN for Dr. Lady Gary. Plan Follow-up Appointments: Return Appointment in 1 week. - Dr. Lady Gary RM 1 with Marquis Lunch*** Anesthetic: Wound #1 Right Trochanter: (In clinic) Topical Lidocaine 4% applied to wound bed Wound #2 Right Back: (In clinic) Topical Lidocaine 4% applied to wound bed Wound #3 Right Gluteus: (In clinic) Topical Lidocaine 4% applied to wound bed Bathing/ Shower/ Hygiene: May shower and wash wound with soap and water. - with dressing changes Off-Loading: Low air-loss mattress (Group 2) - and hospital bed, ordered from [employer] Turn and reposition every 2 hours - may be up in wheelchair for meals Other: - use foam with cut out to offload right side while in the wheelchair Home Health: Dressing changes to be completed by Home Health on Monday / Wednesday / Friday except when patient has scheduled visit at [facility]. Other Home Health Orders/Instructions: - [facility] The following medication(s) was prescribed: lidocaine topical 4 % cream cream topical for prior to debridement was prescribed at facility WOUND #1: - Trochanter Wound Laterality: Right Prim Dressing: Santyl Ointment 1 x Per Day/30 Days ary Discharge Instructions: Apply nickel thick amount to wound edge Prim Dressing: Dakin's Solution 0.125%, 16 (oz) 1 x Per Day/30 Days ary Discharge Instructions: Moisten gauze with Dakin's solution and pack lightly into wound Secondary Dressing: Woven Gauze Sponge, Non-Sterile 4x4 in (Generic) 1 x Per Day/30 Days Discharge Instructions: Apply over primary dressing as directed. Secondary Dressing: Zetuvit Plus Silicone Border Dressing 7x7(in/in) (Generic) 1 x Per Day/30 Days Discharge Instructions: Apply silicone border over primary dressing as directed. WOUND #2: - Back Wound Laterality: Right Prim Dressing: Santyl Ointment 1 x Per Day/30 Days ary Discharge Instructions: Apply nickel thick amount to wound bed as instructed Secondary Dressing: Zetuvit Plus Silicone Border Dressing 7x7(in/in) (Generic) 1 x Per Day/30 Days Discharge Instructions: Apply silicone  border over primary dressing as directed. WOUND #3: - Gluteus Wound Laterality: Right Prim Dressing: Santyl Ointment 1 x Per Day/30 Days ary Discharge Instructions: Apply nickel thick amount to wound bed as instructed Secondary Dressing: Zetuvit Plus Silicone Border Dressing 4x4 (in/in) (Generic) 1 x Per Day/30 Days Discharge Instructions: Apply silicone border over primary dressing as directed. WOUND #4: - Upper Leg Wound Laterality: Right, Lateral Prim Dressing: KerraCel Ag Gelling Fiber Dressing, 2x2 in (silver alginate) 3 x Per Week/30 Days ary Discharge Instructions: Apply silver alginate to wound bed as instructed Secondary Dressing: Zetuvit Plus Silicone Border Dressing 4x4 (in/in) 3 x Per Week/30 Days Discharge Instructions: Apply silicone border over primary dressing as directed. 05/21/2022: The wounds on her right upper back are very fibrotic with pale leathery eschar and slough. The trochanter wound is cleaner this week but there is more bone exposed. The gluteal ulcer is healing but still has an area with thick fibrinous adherent exudate. On her left upper thigh, there was an area last week that looked like deep tissue injury which has now opened up, but it is clean and fairly superficial. I used a curette to debride slough and the heavy fibrinous eschar from her wounds. It is quite resistant to removal. I also debrided slough  and biofilm from her trochanter wound. I used forceps and scissors to trim piece of nonviable skin that was hanging from the margin of the trochanter wound. We will continue to apply Santyl to all of the areas with the leathery pale eschar. Silver alginate with a foam border dressing to the new wound on her upper thigh, and continue Dakin's dressing changes to the trochanter wound. I anticipate I will probably need to get a bone biopsy next week if more bone continues to be exposed. She has not yet received her level 2 and mattress so we will work with the  company to try and get that arranged. She will follow-up in 1 week's time. Electronic Signature(s) Signed: 05/21/2022 3:03:27 PM By: Duanne Guess MD FACS Entered By: Duanne Guess on 05/21/2022 15:03:27 -------------------------------------------------------------------------------- HxROS Details Patient Name: Date of Service: [facility] [place]. 05/21/2022 2:00 PM Medical Record Number: [id number] Patient Account Number: [id number] Date of Birth/Sex: Treating RN: 24-Jan-1991 (30 y.o. Nancy Standard Primary Care Provider: Lynnea Ferrier Other Clinician: Referring Provider: Treating Provider/Extender: Suzan Garibaldi in Treatment: 1 Information Obtained From Patient Caregiver Chart Eyes Medical History: Negative for: Cataracts; Glaucoma; Optic Neuritis Ear/Nose/Mouth/Throat Medical History: Negative for: Chronic sinus problems/congestion; Middle ear problems Cardiovascular Medical History: Positive for: Deep Vein Thrombosis - bil Endocrine Medical History: Negative for: Type I Diabetes; Type II Diabetes Genitourinary Medical History: Negative for: End Stage Renal Disease Past Medical History Notes: incontinence Integumentary (Skin) Medical History: Negative for: History of Burn Musculoskeletal Medical History: Past Medical History Notes: contractures, scoliosis Neurologic Medical History: Positive for: Quadriplegia; Seizure Disorder - epilepsy Past Medical History Notes: cerebral palsy Oncologic Medical History: Negative for: Received Chemotherapy; Received Radiation Psychiatric Medical History: Positive for: Anorexia/bulimia Negative for: Confinement Anxiety Past Medical History Notes: mental retardation Immunizations Pneumococcal Vaccine: Received Pneumococcal Vaccination: No Implantable Devices No devices added Hospitalization / Surgery History Type of Hospitalization/Surgery IUD insertion hip surgery right Family  and Social History Cancer: Yes - Mother; Diabetes: Yes - Father; Heart Disease: Yes - Paternal Grandparents; Hereditary Spherocytosis: No; Hypertension: No; Kidney Disease: No; Lung Disease: No; Seizures: No; Stroke: No; Thyroid Problems: No; Tuberculosis: No; Never smoker; Marital Status - Single; Alcohol Use: Never; Drug Use: No History; Caffeine Use: Never; Financial Concerns: No; Food, Clothing or Shelter Needs: No; Support System Lacking: No; Transportation Concerns: Yes - Financial planner) Signed: 05/21/2022 4:13:56 PM By: Duanne Guess MD FACS Signed: 05/21/2022 5:20:46 PM By: Zenaida Deed RN, BSN Entered By: Duanne Guess on 05/21/2022 15:00:46 -------------------------------------------------------------------------------- SuperBill Details Patient Name: Date of Service: [facility] [place] [facility]. 05/21/2022 Medical Record Number: [id number] Patient Account Number: [id number] Date of Birth/Sex: Treating RN: February 26, 1991 (30 y.o. 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, stage 4 L89.100 Pressure ulcer of unspecified part of back, unstageable L89.313 Pressure ulcer of right buttock, stage 3 Modifier: Quantity: 1 CPT4 Code: [id number] Description: 97598 - DEBRIDE WOUND EA ADDL 20 SQ CM ICD-10 Diagnosis Description L89.214 Pressure ulcer of right  hip, stage 4 L89.100 Pressure ulcer of unspecified part of back, unstageable L89.313 Pressure ulcer of right buttock, stage 3 Modifier: Quantity: 3 Physician Procedures : CPT4 Code Description Modifier [id number] - WC PHYS LEVEL 4 - EST PT 25 ICD-10 Diagnosis Description 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, stage 4 L89.100 Pressure ulcer of unspecified part of back, unstageable L89.313 Pressure ulcer of right  buttock, stage 3 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 Quantity: 1 : [id number] - WC PHYS DEBR WO ANESTH 20 SQ CM ICD-10 Diagnosis Description 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

## 2022-05-22 ENCOUNTER — Telehealth: Payer: Self-pay

## 2022-05-22 NOTE — Telephone Encounter (Signed)
Pt's caregiver, [name] called, stating pt is currently getting treatments/wound care through [place] due to 3 deep bedsores. [name] states pt is having a lot of issues with pain and asks if something stronger than Ibuprofen for when she has wound treatments? Thank you.

## 2022-05-23 DIAGNOSIS — L89110 Pressure ulcer of right upper back, unstageable: Secondary | ICD-10-CM | POA: Diagnosis not present

## 2022-05-23 DIAGNOSIS — G800 Spastic quadriplegic cerebral palsy: Secondary | ICD-10-CM | POA: Diagnosis not present

## 2022-05-23 DIAGNOSIS — E43 Unspecified severe protein-calorie malnutrition: Secondary | ICD-10-CM | POA: Diagnosis not present

## 2022-05-23 DIAGNOSIS — G40909 Epilepsy, unspecified, not intractable, without status epilepticus: Secondary | ICD-10-CM | POA: Diagnosis not present

## 2022-05-23 DIAGNOSIS — M2450 Contracture, unspecified joint: Secondary | ICD-10-CM | POA: Diagnosis not present

## 2022-05-23 DIAGNOSIS — R630 Anorexia: Secondary | ICD-10-CM | POA: Diagnosis not present

## 2022-05-23 DIAGNOSIS — L89313 Pressure ulcer of right buttock, stage 3: Secondary | ICD-10-CM | POA: Diagnosis not present

## 2022-05-23 DIAGNOSIS — F418 Other specified anxiety disorders: Secondary | ICD-10-CM | POA: Diagnosis not present

## 2022-05-23 DIAGNOSIS — L89214 Pressure ulcer of right hip, stage 4: Secondary | ICD-10-CM | POA: Diagnosis not present

## 2022-05-25 DIAGNOSIS — G40909 Epilepsy, unspecified, not intractable, without status epilepticus: Secondary | ICD-10-CM | POA: Diagnosis not present

## 2022-05-25 DIAGNOSIS — L89110 Pressure ulcer of right upper back, unstageable: Secondary | ICD-10-CM | POA: Diagnosis not present

## 2022-05-25 DIAGNOSIS — L89313 Pressure ulcer of right buttock, stage 3: Secondary | ICD-10-CM | POA: Diagnosis not present

## 2022-05-25 DIAGNOSIS — E43 Unspecified severe protein-calorie malnutrition: Secondary | ICD-10-CM | POA: Diagnosis not present

## 2022-05-25 DIAGNOSIS — G800 Spastic quadriplegic cerebral palsy: Secondary | ICD-10-CM | POA: Diagnosis not present

## 2022-05-25 DIAGNOSIS — L89214 Pressure ulcer of right hip, stage 4: Secondary | ICD-10-CM | POA: Diagnosis not present

## 2022-05-28 DIAGNOSIS — E43 Unspecified severe protein-calorie malnutrition: Secondary | ICD-10-CM | POA: Diagnosis not present

## 2022-05-28 DIAGNOSIS — L89214 Pressure ulcer of right hip, stage 4: Secondary | ICD-10-CM | POA: Diagnosis not present

## 2022-05-28 DIAGNOSIS — L89110 Pressure ulcer of right upper back, unstageable: Secondary | ICD-10-CM | POA: Diagnosis not present

## 2022-05-28 DIAGNOSIS — L89313 Pressure ulcer of right buttock, stage 3: Secondary | ICD-10-CM | POA: Diagnosis not present

## 2022-05-28 DIAGNOSIS — G40909 Epilepsy, unspecified, not intractable, without status epilepticus: Secondary | ICD-10-CM | POA: Diagnosis not present

## 2022-05-28 DIAGNOSIS — G800 Spastic quadriplegic cerebral palsy: Secondary | ICD-10-CM | POA: Diagnosis not present

## 2022-05-30 DIAGNOSIS — L89110 Pressure ulcer of right upper back, unstageable: Secondary | ICD-10-CM | POA: Diagnosis not present

## 2022-05-30 DIAGNOSIS — L89313 Pressure ulcer of right buttock, stage 3: Secondary | ICD-10-CM | POA: Diagnosis not present

## 2022-05-30 DIAGNOSIS — E43 Unspecified severe protein-calorie malnutrition: Secondary | ICD-10-CM | POA: Diagnosis not present

## 2022-05-30 DIAGNOSIS — G40909 Epilepsy, unspecified, not intractable, without status epilepticus: Secondary | ICD-10-CM | POA: Diagnosis not present

## 2022-05-30 DIAGNOSIS — G800 Spastic quadriplegic cerebral palsy: Secondary | ICD-10-CM | POA: Diagnosis not present

## 2022-05-30 DIAGNOSIS — L89214 Pressure ulcer of right hip, stage 4: Secondary | ICD-10-CM | POA: Diagnosis not present

## 2022-05-31 ENCOUNTER — Encounter (HOSPITAL_BASED_OUTPATIENT_CLINIC_OR_DEPARTMENT_OTHER): Payer: Medicare Other | Attending: General Surgery | Admitting: General Surgery

## 2022-05-31 ENCOUNTER — Other Ambulatory Visit (HOSPITAL_BASED_OUTPATIENT_CLINIC_OR_DEPARTMENT_OTHER): Payer: Self-pay | Admitting: General Surgery

## 2022-05-31 DIAGNOSIS — L89214 Pressure ulcer of right hip, stage 4: Secondary | ICD-10-CM | POA: Diagnosis not present

## 2022-05-31 DIAGNOSIS — E43 Unspecified severe protein-calorie malnutrition: Secondary | ICD-10-CM | POA: Diagnosis not present

## 2022-05-31 DIAGNOSIS — L89313 Pressure ulcer of right buttock, stage 3: Secondary | ICD-10-CM | POA: Diagnosis not present

## 2022-05-31 DIAGNOSIS — L89893 Pressure ulcer of other site, stage 3: Secondary | ICD-10-CM | POA: Diagnosis not present

## 2022-05-31 DIAGNOSIS — L89152 Pressure ulcer of sacral region, stage 2: Secondary | ICD-10-CM | POA: Insufficient documentation

## 2022-05-31 DIAGNOSIS — G40909 Epilepsy, unspecified, not intractable, without status epilepticus: Secondary | ICD-10-CM | POA: Insufficient documentation

## 2022-05-31 DIAGNOSIS — L89153 Pressure ulcer of sacral region, stage 3: Secondary | ICD-10-CM | POA: Diagnosis not present

## 2022-05-31 DIAGNOSIS — G800 Spastic quadriplegic cerebral palsy: Secondary | ICD-10-CM | POA: Diagnosis not present

## 2022-05-31 DIAGNOSIS — L89100 Pressure ulcer of unspecified part of back, unstageable: Secondary | ICD-10-CM | POA: Insufficient documentation

## 2022-05-31 DIAGNOSIS — M86651 Other chronic osteomyelitis, right thigh: Secondary | ICD-10-CM | POA: Diagnosis not present

## 2022-06-01 DIAGNOSIS — G800 Spastic quadriplegic cerebral palsy: Secondary | ICD-10-CM | POA: Diagnosis not present

## 2022-06-01 DIAGNOSIS — L89110 Pressure ulcer of right upper back, unstageable: Secondary | ICD-10-CM | POA: Diagnosis not present

## 2022-06-01 DIAGNOSIS — G40909 Epilepsy, unspecified, not intractable, without status epilepticus: Secondary | ICD-10-CM | POA: Diagnosis not present

## 2022-06-01 DIAGNOSIS — E43 Unspecified severe protein-calorie malnutrition: Secondary | ICD-10-CM | POA: Diagnosis not present

## 2022-06-01 DIAGNOSIS — L89214 Pressure ulcer of right hip, stage 4: Secondary | ICD-10-CM | POA: Diagnosis not present

## 2022-06-01 DIAGNOSIS — L89313 Pressure ulcer of right buttock, stage 3: Secondary | ICD-10-CM | POA: Diagnosis not present

## 2022-06-01 NOTE — Progress Notes (Signed)
DEVANI, ODONNEL ([id number]) Visit Report for 05/31/2022 Chief Complaint Document Details Patient Name: Date of Service: Nancy [name] [place] 05/31/2022 2:45 PM Medical Record Number: [id number] Patient Account Number: [id number] Date of Birth/Sex: Treating RN: May 29, 1991 (31 y.o. F) Primary Care Provider: Lynnea Ferrier Other Clinician: Referring Provider: Treating Provider/Extender: Suzan Garibaldi in Treatment: 2 Information Obtained from: Patient Chief Complaint Patient is at the clinic for treatment of multiple open pressure ulcers Electronic Signature(s) Signed: 05/31/2022 4:09:58 PM By: Duanne Guess MD FACS Entered By: Duanne Guess on 05/31/2022 16:09:57 -------------------------------------------------------------------------------- Debridement Details Patient Name: Date of Service: Nancy [name] [place]. 05/31/2022 2:45 PM Medical Record Number: [id number] Patient Account Number: [id number] Date of Birth/Sex: Treating RN: 16-Jun-1991 (31 y.o. Nancy [name] Primary Care Provider: Lynnea Ferrier Other Clinician: Referring Provider: Treating Provider/Extender: Suzan Garibaldi in Treatment: 2 Debridement Performed for Assessment: Wound #2 Right Back Performed By: Physician Duanne Guess, MD Debridement Type: Debridement Level of Consciousness (Pre-procedure): Awake and Alert Pre-procedure Verification/Time Out Yes - 15:40 Taken: Start Time: 15:40 Pain Control: Lidocaine 4% T opical Solution T Area Debrided (L x W): otal 9 (cm) x 4 (cm) = 36 (cm) Tissue and other material debrided: Viable, Non-Viable, Eschar, Subcutaneous Level: Skin/Subcutaneous Tissue Debridement Description: Excisional Instrument: Blade, Curette, Forceps Bleeding: Minimum Hemostasis Achieved: Pressure Procedural Pain: 2 Post Procedural Pain: 1 Response to Treatment: Procedure was tolerated well Level of Consciousness (Post-  Awake and Alert procedure): Post Debridement Measurements of Total Wound Length: (cm) 9 Stage: Unstageable/Unclassified Width: (cm) 8.5 Depth: (cm) 0.4 Volume: (cm) 24.033 Character of Wound/Ulcer Post Debridement: Requires Further Debridement Post Procedure Diagnosis Same as Pre-procedure Notes scribed by L. Boehlein, RN for Dr. Lady Gary Electronic Signature(s) Signed: 05/31/2022 4:16:38 PM By: Duanne Guess MD FACS Signed: 05/31/2022 6:18:14 PM By: Zenaida Deed RN, BSN Entered By: Zenaida Deed on 05/31/2022 15:49:28 -------------------------------------------------------------------------------- Debridement Details Patient Name: Date of Service: Nancy [name] [place] N. 05/31/2022 2:45 PM Medical Record Number: [id number] Patient Account Number: [id number] Date of Birth/Sex: Treating RN: [birth year] (31 y.o. Nancy [name] Primary Care Provider: Lynnea Ferrier Other Clinician: Referring Provider: Treating Provider/Extender: Suzan Garibaldi in Treatment: 2 Debridement Performed for Assessment: Wound #3 Right Gluteus Performed By: Physician Duanne Guess, MD Debridement Type: Debridement Level of Consciousness (Pre-procedure): Awake and Alert Pre-procedure Verification/Time Out Yes - 15:40 Taken: Start Time: 15:40 Pain Control: Lidocaine 4% T opical Solution T Area Debrided (L x W): otal 0.8 (cm) x 3 (cm) = 2.4 (cm) Tissue and other material debrided: Non-Viable, Slough, Slough Level: Non-Viable Tissue Debridement Description: Selective/Open Wound Instrument: Curette Bleeding: Minimum Hemostasis Achieved: Pressure Procedural Pain: 2 Post Procedural Pain: 1 Response to Treatment: Procedure was tolerated well Level of Consciousness (Post- Awake and Alert procedure): Post Debridement Measurements of Total Wound Length: (cm) 0.8 Stage: Category/Stage III Width: (cm) 3 Depth: (cm) 0.1 Volume: (cm) 0.188 Character of Wound/Ulcer  Post Debridement: Requires Further Debridement Post Procedure Diagnosis Same as Pre-procedure Notes scribed by L. Boehlein, RN for Dr. Lady Gary Electronic Signature(s) Signed: 05/31/2022 4:16:38 PM By: Duanne Guess MD FACS Signed: 05/31/2022 6:18:14 PM By: Zenaida Deed RN, BSN Entered By: Zenaida Deed on 05/31/2022 15:49:42 -------------------------------------------------------------------------------- Debridement Details Patient Name: Date of Service: Nancy [name] [place]. 05/31/2022 2:45 PM Medical Record Number: [id number] Patient Account Number: [id number] Date of Birth/Sex: Treating RN: 03-22-1991 (31 y.o. Nancy [name] Primary Care Provider: Lynnea Ferrier Other Clinician: Referring Provider: Treating Provider/Extender:  Matthew Folks Weeks in Treatment: 2 Debridement Performed for Assessment: Wound #4 Right,Lateral Upper Leg Performed By: Physician Duanne Guess, MD Debridement Type: Debridement Level of Consciousness (Pre-procedure): Awake and Alert Pre-procedure Verification/Time Out Yes - 15:40 Taken: Start Time: 15:40 Pain Control: Lidocaine 4% T opical Solution T Area Debrided (L x W): otal 1 (cm) x 0.8 (cm) = 0.8 (cm) Tissue and other material debrided: Non-Viable, Eschar, Slough, Slough Level: Non-Viable Tissue Debridement Description: Selective/Open Wound Instrument: Curette Bleeding: None Procedural Pain: 2 Post Procedural Pain: 1 Response to Treatment: Procedure was tolerated well Level of Consciousness (Post- Awake and Alert procedure): Post Debridement Measurements of Total Wound Length: (cm) 1 Stage: Category/Stage III Width: (cm) 0.8 Depth: (cm) 0.1 Volume: (cm) 0.063 Character of Wound/Ulcer Post Debridement: Requires Further Debridement Post Procedure Diagnosis Same as Pre-procedure Electronic Signature(s) Signed: 05/31/2022 4:16:38 PM By: Duanne Guess MD FACS Signed: 05/31/2022 6:18:14 PM By:  Zenaida Deed RN, BSN Entered By: Zenaida Deed on 05/31/2022 15:50:42 -------------------------------------------------------------------------------- Debridement Details Patient Name: Date of Service: Nancy [name] [place]. 05/31/2022 2:45 PM Medical Record Number: [id number] Patient Account Number: [id number] Date of Birth/Sex: Treating RN: 1991-05-08 (31 y.o. Nancy [name] Primary Care Provider: Lynnea Ferrier Other Clinician: Referring Provider: Treating Provider/Extender: Suzan Garibaldi in Treatment: 2 Debridement Performed for Assessment: Wound #1 Right Trochanter Performed By: Physician Duanne Guess, MD Debridement Type: Debridement Level of Consciousness (Pre-procedure): Awake and Alert Pre-procedure Verification/Time Out Yes - 15:40 Taken: Start Time: 15:40 Pain Control: Lidocaine 4% T opical Solution T Area Debrided (L x W): otal 2 (cm) x 2 (cm) = 4 (cm) Tissue and other material debrided: Viable, Non-Viable, Bone, Slough, Skin: Epidermis, Slough Level: Skin/Subcutaneous Tissue/Muscle/Bone Debridement Description: Excisional Instrument: Blade, Forceps, Rongeur Specimen: Tissue Culture Number of Specimens T aken: 1 Bleeding: Minimum Hemostasis Achieved: Pressure Procedural Pain: 2 Post Procedural Pain: 1 Response to Treatment: Procedure was tolerated well Level of Consciousness (Post- Awake and Alert procedure): Post Debridement Measurements of Total Wound Length: (cm) 5.7 Stage: Category/Stage IV Width: (cm) 5.1 Depth: (cm) 1.2 Volume: (cm) 27.398 Character of Wound/Ulcer Post Debridement: Requires Further Debridement Post Procedure Diagnosis Same as Pre-procedure Notes scribed by L. Boehlein, RN for Dr. Lady Gary Electronic Signature(s) Signed: 05/31/2022 4:16:38 PM By: Duanne Guess MD FACS Signed: 05/31/2022 6:18:14 PM By: Zenaida Deed RN, BSN Entered By: Zenaida Deed on 05/31/2022  15:57:46 -------------------------------------------------------------------------------- Debridement Details Patient Name: Date of Service: Nancy [name] [place]. 05/31/2022 2:45 PM Medical Record Number: [id number] Patient Account Number: [id number] Date of Birth/Sex: Treating RN: 1990/10/26 (30 y.o. Nancy [name] Primary Care Provider: Lynnea Ferrier Other Clinician: Referring Provider: Treating Provider/Extender: Suzan Garibaldi in Treatment: 2 Debridement Performed for Assessment: Wound #5 Sacrum Performed By: Physician Duanne Guess, MD Debridement Type: Debridement Level of Consciousness (Pre-procedure): Awake and Alert Pre-procedure Verification/Time Out Yes - 15:40 Taken: Start Time: 15:40 Pain Control: Lidocaine 4% T opical Solution T Area Debrided (L x W): otal 0.5 (cm) x 0.5 (cm) = 0.25 (cm) Tissue and other material debrided: Non-Viable, Eschar, Slough, Slough Level: Non-Viable Tissue Debridement Description: Selective/Open Wound Instrument: Curette Bleeding: None Procedural Pain: 2 Post Procedural Pain: 1 Response to Treatment: Procedure was tolerated well Level of Consciousness (Post- Awake and Alert procedure): Post Debridement Measurements of Total Wound Length: (cm) 0.5 Stage: Category/Stage III Width: (cm) 0.5 Depth: (cm) 0.1 Volume: (cm) 0.02 Character of Wound/Ulcer Post Debridement: Requires Further Debridement Post Procedure Diagnosis Same as Pre-procedure Notes scribed  by Barbee Cough, RN for Dr. Lady Gary Electronic Signature(s) Signed: 05/31/2022 4:16:38 PM By: Duanne Guess MD FACS Signed: 05/31/2022 6:18:14 PM By: Zenaida Deed RN, BSN Entered By: Zenaida Deed on 05/31/2022 15:57:57 -------------------------------------------------------------------------------- HPI Details Patient Name: Date of Service: Nancy [name] [place] N. 05/31/2022 2:45 PM Medical Record Number: [id number] Patient Account  Number: [id number] Date of Birth/Sex: Treating RN: 16-Mar-1991 (30 y.o. F) Primary Care Provider: Lynnea Ferrier Other Clinician: Referring Provider: Treating Provider/Extender: Suzan Garibaldi in Treatment: 2 History of Present Illness HPI Description: ADMISSION 05/14/2022 This is a 31 year old woman with spastic quadriplegic cerebral palsy, seizure disorder, and severe protein calorie malnutrition. She is brought to clinic today by her care provider. Apparently the patient was previously living with her father but he has become elderly and unable to manage her needs. She has been moved from various group homes and is now been in her current situation since July. Her care provider states that she had multiple ulcers in July but had no history of how long they have been present or any wound care instructions. The patient does not have an appropriate mattress and spends much of her time sitting in her wheelchair. Due to her contractures and contortion of her body, there is significant pressure applied to her right side. She is here today with multiple pressure ulcers in a line running down from her right back and involving her right trochanter and right ischium. On her right upper back, there are 3 adjacent ulcers. 2 of them have very eschar on them and are unstageable. The third site appears to be stage II with just breakdown of skin. It is very dry. There is a stage IV ulcer on her trochanter. Bone is palpable and a small area is exposed. There is some slough around the margin as well as an area with the same leathery eschar that is on her back. On her gluteus, there is a stage III ulcer. It, too, is a little bit dry and there is leathery eschar adjacent to the open portion. 05/21/2022 the wounds on her right upper back are very fibrotic with pale leathery eschar and slough. The trochanter wound is cleaner this week but there is more bone exposed. The gluteal ulcer is  healing but still has an area with thick fibrinous adherent exudate. On her left upper thigh, there was an area last week it looks like deep tissue injury which has now opened up, but it is clean and fairly superficial. 05/31/2022: The leathery eschar overlying the wound on her spine is breaking down. Muscle is exposed but no bone is palpable. The trochanter wound has evidence of ongoing pressure injury to the tissues. The gluteal and left upper thigh ulcers are a bit smaller today. She has a new stage II pressure ulcer on her sacrum. The patient's caregiver says that she tries to turn her, but the patient screams in pain when she is not on her right side. They still have not obtained the eggcrate foam to offload her while she is in her wheelchair, but her low-air-loss mattress finally arrived yesterday. Electronic Signature(s) Signed: 05/31/2022 4:11:25 PM By: Duanne Guess MD FACS Entered By: Duanne Guess on 05/31/2022 16:11:24 -------------------------------------------------------------------------------- Physical Exam Details Patient Name: Date of Service: Nancy [name] [place]. 05/31/2022 2:45 PM Medical Record Number: [id number] Patient Account Number: [id number] Date of Birth/Sex: Treating RN: July 23, 1991 (30 y.o. F) Primary Care Provider: Lynnea Ferrier Other Clinician: Referring Provider: Treating Provider/Extender: Matthew Folks  Weeks in Treatment: 2 Constitutional Hypotensive, normal for this patient, asymptomatic. Slightly tachycardic, asymptomatic. . . No acute distress.Marland Kitchen Respiratory Normal work of breathing on room air.. Notes 05/31/2022: The leathery eschar overlying the wound on her spine is breaking down. Muscle is exposed but no bone is palpable. The trochanter wound has evidence of ongoing pressure injury to the tissues. The gluteal and left upper thigh ulcers are a bit smaller today. She has a new stage II pressure ulcer on  her sacrum. Electronic Signature(s) Signed: 05/31/2022 4:12:42 PM By: Duanne Guess MD FACS Entered By: Duanne Guess on 05/31/2022 16:12:42 -------------------------------------------------------------------------------- Physician Orders Details Patient Name: Date of Service: Nancy [name] [place] N. 05/31/2022 2:45 PM Medical Record Number: [id number] Patient Account Number: [id number] Date of Birth/Sex: Treating RN: 20-Mar-1991 (30 y.o. Nancy [name] Primary Care Provider: Lynnea Ferrier Other Clinician: Referring Provider: Treating Provider/Extender: Suzan Garibaldi in Treatment: 2 Verbal / Phone Orders: No Diagnosis Coding ICD-10 Coding Code Description 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, stage 4 L89.100 Pressure ulcer of unspecified part of back, unstageable L89.313 Pressure ulcer of right buttock, stage 3 R63.0 Anorexia E43 Unspecified severe protein-calorie malnutrition L89.152 Pressure ulcer of sacral region, stage 2 Follow-up Appointments ppointment in 2 weeks. - Dr. Lady Gary RM 1 with Nancy [name]*** Return A Anesthetic Wound #1 Right Trochanter (In clinic) Topical Lidocaine 4% applied to wound bed Wound #2 Right Back (In clinic) Topical Lidocaine 4% applied to wound bed Wound #3 Right Gluteus (In clinic) Topical Lidocaine 4% applied to wound bed Wound #5 Sacrum (In clinic) Topical Lidocaine 4% applied to wound bed Bathing/ Shower/ Hygiene May shower and wash wound with soap and water. - with dressing changes Off-Loading Low air-loss mattress (Group 2) - and hospital bed, ordered from [employer] Turn and reposition every 2 hours - may be up in wheelchair for meals only, avoid laying on right side Other: - use foam with cut out to offload right side while in the wheelchair Home Health Dressing changes to be completed by Home Health on Monday / Wednesday / Friday except when  patient has scheduled visit at [facility]. Other Home Health Orders/Instructions: - [facility] Wound Treatment Wound #1 - Trochanter Wound Laterality: Right Prim Dressing: Santyl Ointment 1 x Per Day/30 Days ary Discharge Instructions: Apply nickel thick amount to wound edge Prim Dressing: Dakin's Solution 0.125%, 16 (oz) 1 x Per Day/30 Days ary Discharge Instructions: Moisten gauze with Dakin's solution and pack lightly into wound Secondary Dressing: Woven Gauze Sponge, Non-Sterile 4x4 in (Generic) 1 x Per Day/30 Days Discharge Instructions: Apply over primary dressing as directed. Secondary Dressing: Zetuvit Plus Silicone Border Dressing 7x7(in/in) (Generic) 1 x Per Day/30 Days Discharge Instructions: Apply silicone border over primary dressing as directed. Wound #2 - Back Wound Laterality: Right Prim Dressing: Santyl Ointment 1 x Per Day/30 Days ary Discharge Instructions: Apply nickel thick amount to wound bed as instructed Secondary Dressing: Woven Gauze Sponge, Non-Sterile 4x4 in 1 x Per Day/30 Days Discharge Instructions: Apply over primary dressing moistened with saline in midline to fill the space Secondary Dressing: Zetuvit Plus Silicone Border Dressing 7x7(in/in) (Generic) 1 x Per Day/30 Days Discharge Instructions: Apply silicone border over primary dressing as directed. Wound #3 - Gluteus Wound Laterality: Right Prim Dressing: Santyl Ointment 1 x Per Day/30 Days ary Discharge Instructions: Apply nickel thick amount to wound bed as instructed Secondary Dressing: Zetuvit Plus Silicone Border Dressing 4x4 (in/in) (Generic) 1 x  Per Day/30 Days Discharge Instructions: Apply silicone border over primary dressing as directed. Wound #4 - Upper Leg Wound Laterality: Right, Lateral Prim Dressing: Santyl Ointment 1 x Per Day/30 Days ary Discharge Instructions: Apply nickel thick amount to wound bed as instructed Secondary Dressing: Zetuvit Plus Silicone Border Dressing 4x4  (in/in) (Generic) 1 x Per Day/30 Days Discharge Instructions: Apply silicone border over primary dressing as directed. Wound #5 - Sacrum Prim Dressing: Santyl Ointment 1 x Per Day/30 Days ary Discharge Instructions: Apply nickel thick amount to wound bed as instructed Secondary Dressing: Zetuvit Plus Silicone Border Dressing 7x7(in/in) (Generic) 1 x Per Day/30 Days Discharge Instructions: Apply silicone border over primary dressing as directed. Laboratory Bacteria identified in Tissue by Biopsy culture (MICRO) - bone right trochanter LOINC Code: 16109-6 Convenience Name: Biopsy specimen culture naerobe culture (MICRO) - right trochanter bone Bacteria identified in Unspecified specimen by A LOINC Code: 635-3 Convenience Name: Anaerobic culture Electronic Signature(s) Signed: 05/31/2022 6:18:14 PM By: Zenaida Deed RN, BSN Signed: 06/01/2022 7:52:14 AM By: Duanne Guess MD FACS Previous Signature: 05/31/2022 4:16:38 PM Version By: Duanne Guess MD FACS Entered By: Zenaida Deed on 05/31/2022 16:35:57 -------------------------------------------------------------------------------- Problem List Details Patient Name: Date of Service: Nancy [name] [place] N. 05/31/2022 2:45 PM Medical Record Number: [id number] Patient Account Number: [id number] Date of Birth/Sex: Treating RN: [birth year] (30 y.o. Nancy [name] Primary Care Provider: Lynnea Ferrier Other Clinician: Referring Provider: Treating Provider/Extender: Suzan Garibaldi in Treatment: 2 Active Problems ICD-10 Encounter Code Description Active Date MDM Diagnosis 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 05/14/2022 No Yes 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, stage 4 05/14/2022 No Yes L89.100 Pressure ulcer of unspecified part of back, unstageable 05/14/2022 No Yes L89.313 Pressure ulcer of right buttock, stage 3 05/14/2022 No Yes R63.0 Anorexia 05/14/2022 No Yes E43 Unspecified severe  protein-calorie malnutrition 05/14/2022 No Yes L89.152 Pressure ulcer of sacral region, stage 2 05/31/2022 No Yes Inactive Problems Resolved Problems Electronic Signature(s) Signed: 05/31/2022 4:09:39 PM By: Duanne Guess MD FACS Entered By: Duanne Guess on 05/31/2022 16:09:38 -------------------------------------------------------------------------------- Progress Note Details Patient Name: Date of Service: Nancy [name] [place] N. 05/31/2022 2:45 PM Medical Record Number: [id number] Patient Account Number: [id number] Date of Birth/Sex: Treating RN: [birth year] (30 y.o. F) Primary Care Provider: Lynnea Ferrier Other Clinician: Referring Provider: Treating Provider/Extender: Suzan Garibaldi in Treatment: 2 Subjective Chief Complaint Information obtained from Patient Patient is at the clinic for treatment of multiple open pressure ulcers History of Present Illness (HPI) ADMISSION 05/14/2022 This is a 30 year old woman with spastic quadriplegic cerebral palsy, seizure disorder, and severe protein calorie malnutrition. She is brought to clinic today by her care provider. Apparently the patient was previously living with her father but he has become elderly and unable to manage her needs. She has been moved from various group homes and is now been in her current situation since July. Her care provider states that she had multiple ulcers in July but had no history of how long they have been present or any wound care instructions. The patient does not have an appropriate mattress and spends much of her time sitting in her wheelchair. Due to her contractures and contortion of her body, there is significant pressure applied to her right side. She is here today with multiple pressure ulcers in a line running down from her right back and involving her right trochanter and right ischium. On her right upper back, there are 3 adjacent ulcers. 2 of them have  very eschar on  them and are unstageable. The third site appears to be stage II with just breakdown of skin. It is very dry. There is a stage IV ulcer on her trochanter. Bone is palpable and a small area is exposed. There is some slough around the margin as well as an area with the same leathery eschar that is on her back. On her gluteus, there is a stage III ulcer. It, too, is a little bit dry and there is leathery eschar adjacent to the open portion. 05/21/2022 the wounds on her right upper back are very fibrotic with pale leathery eschar and slough. The trochanter wound is cleaner this week but there is more bone exposed. The gluteal ulcer is healing but still has an area with thick fibrinous adherent exudate. On her left upper thigh, there was an area last week it looks like deep tissue injury which has now opened up, but it is clean and fairly superficial. 05/31/2022: The leathery eschar overlying the wound on her spine is breaking down. Muscle is exposed but no bone is palpable. The trochanter wound has evidence of ongoing pressure injury to the tissues. The gluteal and left upper thigh ulcers are a bit smaller today. She has a new stage II pressure ulcer on her sacrum. The patient's caregiver says that she tries to turn her, but the patient screams in pain when she is not on her right side. They still have not obtained the eggcrate foam to offload her while she is in her wheelchair, but her low-air-loss mattress finally arrived yesterday. Patient History Information obtained from Patient, Caregiver, Chart. Family History Cancer - Mother, Diabetes - Father, Heart Disease - Paternal Grandparents, No family history of Hereditary Spherocytosis, Hypertension, Kidney Disease, Lung Disease, Seizures, Stroke, Thyroid Problems, Tuberculosis. Social History Never smoker, Marital Status - Single, Alcohol Use - Never, Drug Use - No History, Caffeine Use - Never. Medical History Eyes Denies history of Cataracts,  Glaucoma, Optic Neuritis Ear/Nose/Mouth/Throat Denies history of Chronic sinus problems/congestion, Middle ear problems Cardiovascular Patient has history of Deep Vein Thrombosis - bil Endocrine Denies history of Type I Diabetes, Type II Diabetes Genitourinary Denies history of End Stage Renal Disease Integumentary (Skin) Denies history of History of Burn Neurologic Patient has history of Quadriplegia, Seizure Disorder - epilepsy Oncologic Denies history of Received Chemotherapy, Received Radiation Psychiatric Patient has history of Anorexia/bulimia Denies history of Confinement Anxiety Hospitalization/Surgery History - IUD insertion. - hip surgery right. Medical A Surgical History Notes nd Genitourinary incontinence Musculoskeletal contractures, scoliosis Neurologic cerebral palsy Psychiatric mental retardation Objective Constitutional Hypotensive, normal for this patient, asymptomatic. Slightly tachycardic, asymptomatic. No acute distress.. Vitals Time Taken: 3:11 PM, Weight: 70 lbs, Temperature: 97.8 F, Pulse: 109 bpm, Respiratory Rate: 18 breaths/min, Blood Pressure: 85/49 mmHg. Respiratory Normal work of breathing on room air.. General Notes: 05/31/2022: The leathery eschar overlying the wound on her spine is breaking down. Muscle is exposed but no bone is palpable. The trochanter wound has evidence of ongoing pressure injury to the tissues. The gluteal and left upper thigh ulcers are a bit smaller today. She has a new stage II pressure ulcer on her sacrum. Integumentary (Hair, Skin) Wound #1 status is Open. Original cause of wound was Pressure Injury. The date acquired was: 01/25/2022. The wound has been in treatment 2 weeks. The wound is located on the Right Trochanter. The wound measures 5.7cm length x 5.1cm width x 1.2cm depth; 22.832cm^2 area and 27.398cm^3 volume. There is bone, muscle, and Fat Layer (  Subcutaneous Tissue) exposed. There is undermining starting at  4:00 and ending at 12:00 with a maximum distance of 1.6cm. There is a large amount of serosanguineous drainage noted. The wound margin is well defined and not attached to the wound base. There is medium (34-66%) Nancy granulation within the wound bed. There is a medium (34-66%) amount of necrotic tissue within the wound bed including Adherent Slough and Necrosis of Muscle. The periwound skin appearance had no abnormalities noted for texture. The periwound skin appearance had no abnormalities noted for moisture. The periwound skin appearance had no abnormalities noted for color. Periwound temperature was noted as No Abnormality. Wound #2 status is Open. Original cause of wound was Pressure Injury. The date acquired was: 01/25/2022. The wound has been in treatment 2 weeks. The wound is located on the Right Back. The wound measures 9cm length x 8.5cm width x 0.4cm depth; 60.083cm^2 area and 24.033cm^3 volume. There is Fat Layer (Subcutaneous Tissue) exposed. There is no tunneling or undermining noted. There is a medium amount of serous drainage noted. The wound margin is flat and intact. There is small (1-33%) Nancy granulation within the wound bed. There is a large (67-100%) amount of necrotic tissue within the wound bed including Eschar and Adherent Slough. The periwound skin appearance had no abnormalities noted for texture. The periwound skin appearance had no abnormalities noted for moisture. The periwound skin appearance had no abnormalities noted for color. Periwound temperature was noted as No Abnormality. Wound #3 status is Open. Original cause of wound was Pressure Injury. The date acquired was: 01/25/2022. The wound has been in treatment 2 weeks. The wound is located on the Right Gluteus. The wound measures 0.8cm length x 3cm width x 0.1cm depth; 1.885cm^2 area and 0.188cm^3 volume. There is Fat Layer (Subcutaneous Tissue) exposed. There is a medium amount of serosanguineous drainage noted. The wound  margin is flat and intact. There is medium (34-66%) Nancy, pink granulation within the wound bed. There is a medium (34-66%) amount of necrotic tissue within the wound bed including Adherent Slough. The periwound skin appearance had no abnormalities noted for texture. The periwound skin appearance had no abnormalities noted for moisture. The periwound skin appearance had no abnormalities noted for color. Periwound temperature was noted as No Abnormality. Wound #4 status is Open. Original cause of wound was Pressure Injury. The date acquired was: 05/21/2022. The wound has been in treatment 1 weeks. The wound is located on the Right,Lateral Upper Leg. The wound measures 1cm length x 0.8cm width x 0.1cm depth; 0.628cm^2 area and 0.063cm^3 volume. There is Fat Layer (Subcutaneous Tissue) exposed. There is no tunneling or undermining noted. There is a small amount of serosanguineous drainage noted. The wound margin is flat and intact. There is small (1-33%) Nancy granulation within the wound bed. There is a large (67-100%) amount of necrotic tissue within the wound bed including Eschar and Adherent Slough. The periwound skin appearance had no abnormalities noted for texture. The periwound skin appearance had no abnormalities noted for moisture. The periwound skin appearance had no abnormalities noted for color. Periwound temperature was noted as No Abnormality. Wound #5 status is Open. Original cause of wound was Pressure Injury. The date acquired was: 05/31/2022. The wound is located on the Sacrum. The wound measures 0.5cm length x 0.5cm width x 0.1cm depth; 0.196cm^2 area and 0.02cm^3 volume. There is Fat Layer (Subcutaneous Tissue) exposed. There is no tunneling or undermining noted. There is a small amount of serous drainage noted. The wound  margin is flat and intact. There is no granulation within the wound bed. There is a large (67-100%) amount of necrotic tissue within the wound bed including Adherent  Slough. The periwound skin appearance had no abnormalities noted for texture. The periwound skin appearance had no abnormalities noted for moisture. The periwound skin appearance had no abnormalities noted for color. Periwound temperature was noted as No Abnormality. 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 There was a Excisional Skin/Subcutaneous Tissue/Muscle/Bone Debridement with a total area of 4 sq cm performed by Duanne Guess, MD. With the following instrument(s): Blade, Forceps, and Rongeur to remove Viable and Non-Viable tissue/material. Material removed includes Bone,Slough, and Skin: Epidermis after achieving pain control using Lidocaine 4% T opical Solution. 1 specimen was taken by a Tissue Culture and sent to the lab per facility protocol. A time out was conducted at 15:40, prior to the start of the procedure. A Minimum amount of bleeding was controlled with Pressure. The procedure was tolerated well with a pain level of 2 throughout and a pain level of 1 following the procedure. Post Debridement Measurements: 5.7cm length x 5.1cm width x 1.2cm depth; 27.398cm^3 volume. Post debridement Stage noted as Category/Stage IV. Character of Wound/Ulcer Post Debridement requires further debridement. Post procedure Diagnosis Wound #1: Same as Pre-Procedure General Notes: scribed by L. Boehlein, RN for Dr. Lady Gary. Wound #2 Pre-procedure diagnosis of Wound #2 is a Pressure Ulcer located on the Right Back . There was a Excisional Skin/Subcutaneous Tissue Debridement with a total area of 36 sq cm performed by Duanne Guess, MD. With the following instrument(s): Blade, Curette, and Forceps to remove Viable and Non-Viable tissue/material. Material removed includes Eschar and Subcutaneous Tissue and after achieving pain control using Lidocaine 4% T opical Solution. No specimens were taken. A time out was conducted at 15:40, prior to the start of the procedure. A Minimum amount of bleeding was controlled with Pressure. The procedure was tolerated well with a pain level of 2 throughout and a pain level of 1 following the procedure. Post Debridement Measurements: 9cm length x 8.5cm width x 0.4cm depth; 24.033cm^3 volume. Post debridement Stage noted as Unstageable/Unclassified. Character of Wound/Ulcer Post Debridement requires further debridement. Post procedure Diagnosis Wound #2: Same as Pre-Procedure General Notes: scribed by L. Boehlein, RN for Dr. Lady Gary. Wound #3 Pre-procedure diagnosis of Wound #3 is a Pressure Ulcer located on the Right Gluteus . There was a Selective/Open Wound Non-Viable Tissue Debridement with a total area of 2.4 sq cm performed by Duanne Guess, MD. With the following instrument(s): Curette to remove Non-Viable tissue/material. Material removed includes [facility] after achieving pain control using Lidocaine 4% Topical Solution. No specimens were taken. A time out was conducted at 15:40, prior to the start of the procedure. A Minimum amount of bleeding was controlled with Pressure. The procedure was tolerated well with a pain level of 2 throughout and a pain level of 1 following the procedure. Post Debridement Measurements: 0.8cm length x 3cm width x 0.1cm depth; 0.188cm^3 volume. Post debridement Stage noted as Category/Stage III. Character of Wound/Ulcer Post Debridement requires further debridement. Post procedure Diagnosis Wound #3: Same as Pre-Procedure General Notes: scribed by L. Boehlein, RN for Dr.  Merrick Maggio. Wound #4 Pre-procedure diagnosis of Wound #4 is a Pressure Ulcer  located on the Right,Lateral Upper Leg . There was a Selective/Open Wound Non-Viable Tissue Debridement with a total area of 0.8 sq cm performed by Duanne Guess, MD. With the following instrument(s): Curette to remove Non-Viable tissue/material. Material removed includes Eschar and Slough and after achieving pain control using Lidocaine 4% T opical Solution. No specimens were taken. A time out was conducted at 15:40, prior to the start of the procedure. There was no bleeding. The procedure was tolerated well with a pain level of 2 throughout and a pain level of 1 following the procedure. Post Debridement Measurements: 1cm length x 0.8cm width x 0.1cm depth; 0.063cm^3 volume. Post debridement Stage noted as Category/Stage III. Character of Wound/Ulcer Post Debridement requires further debridement. Post procedure Diagnosis Wound #4: Same as Pre-Procedure Wound #5 Pre-procedure diagnosis of Wound #5 is a Pressure Ulcer located on the Sacrum . There was a Selective/Open Wound Non-Viable Tissue Debridement with a total area of 0.25 sq cm performed by Duanne Guess, MD. With the following instrument(s): Curette to remove Non-Viable tissue/material. Material removed includes Eschar and Slough and after achieving pain control using Lidocaine 4% T opical Solution. No specimens were taken. A time out was conducted at 15:40, prior to the start of the procedure. There was no bleeding. The procedure was tolerated well with a pain level of 2 throughout and a pain level of 1 following the procedure. Post Debridement Measurements: 0.5cm length x 0.5cm width x 0.1cm depth; 0.02cm^3 volume. Post debridement Stage noted as Category/Stage III. Character of Wound/Ulcer Post Debridement requires further debridement. Post procedure Diagnosis Wound #5: Same as Pre-Procedure General Notes: scribed by L. Boehlein, RN for Dr. Lady Gary. Plan Follow-up Appointments: Return Appointment in 2 weeks. - Dr. Lady Gary RM 1 with  Nancy [name]*** Anesthetic: Wound #1 Right Trochanter: (In clinic) Topical Lidocaine 4% applied to wound bed Wound #2 Right Back: (In clinic) Topical Lidocaine 4% applied to wound bed Wound #3 Right Gluteus: (In clinic) Topical Lidocaine 4% applied to wound bed Wound #5 Sacrum: (In clinic) Topical Lidocaine 4% applied to wound bed Bathing/ Shower/ Hygiene: May shower and wash wound with soap and water. - with dressing changes Off-Loading: Low air-loss mattress (Group 2) - and hospital bed, ordered from [employer] Turn and reposition every 2 hours - may be up in wheelchair for meals only, avoid laying on right side Other: - use foam with cut out to offload right side while in the wheelchair Home Health: Dressing changes to be completed by Home Health on Monday / Wednesday / Friday except when patient has scheduled visit at [facility] Other Home Health Orders/Instructions: - [facility] WOUND #1: - Trochanter Wound Laterality: Right Prim Dressing: Santyl Ointment 1 x Per Day/30 Days ary Discharge Instructions: Apply nickel thick amount to wound edge Prim Dressing: Dakin's Solution 0.125%, 16 (oz) 1 x Per Day/30 Days ary Discharge Instructions: Moisten gauze with Dakin's solution and pack lightly into wound Secondary Dressing: Woven Gauze Sponge, Non-Sterile 4x4 in (Generic) 1 x Per Day/30 Days Discharge Instructions: Apply over primary dressing as directed. Secondary Dressing: Zetuvit Plus Silicone Border Dressing 7x7(in/in) (Generic) 1 x Per Day/30 Days Discharge Instructions: Apply silicone border over primary dressing as directed. WOUND #2: - Back Wound Laterality: Right Prim Dressing: Santyl Ointment 1 x Per Day/30 Days ary Discharge Instructions: Apply nickel thick amount to wound bed as instructed Secondary Dressing: Woven Gauze Sponge, Non-Sterile 4x4 in 1 x Per  Day/30 Days Discharge Instructions: Apply over primary dressing moistened with saline in midline to  fill the space Secondary Dressing: Zetuvit Plus Silicone Border Dressing 7x7(in/in) (Generic) 1 x Per Day/30 Days Discharge Instructions: Apply silicone border over primary dressing as directed. WOUND #3: - Gluteus Wound Laterality: Right Prim Dressing: Santyl Ointment 1 x Per Day/30 Days ary Discharge Instructions: Apply nickel thick amount to wound bed as instructed Secondary Dressing: Zetuvit Plus Silicone Border Dressing 4x4 (in/in) (Generic) 1 x Per Day/30 Days Discharge Instructions: Apply silicone border over primary dressing as directed. WOUND #4: - Upper Leg Wound Laterality: Right, Lateral Prim Dressing: Santyl Ointment 1 x Per Day/30 Days ary Discharge Instructions: Apply nickel thick amount to wound bed as instructed Secondary Dressing: Zetuvit Plus Silicone Border Dressing 4x4 (in/in) (Generic) 1 x Per Day/30 Days Discharge Instructions: Apply silicone border over primary dressing as directed. WOUND #5: - Sacrum Wound Laterality: Prim Dressing: Santyl Ointment 1 x Per Day/30 Days ary Discharge Instructions: Apply nickel thick amount to wound bed as instructed Secondary Dressing: Zetuvit Plus Silicone Border Dressing 7x7(in/in) (Generic) 1 x Per Day/30 Days Discharge Instructions: Apply silicone border over primary dressing as directed. 05/31/2022: The leathery eschar overlying the wound on her spine is breaking down. Muscle is exposed but no bone is palpable. The trochanter wound has evidence of ongoing pressure injury to the tissues. The gluteal and left upper thigh ulcers are a bit smaller today. She has a new stage II pressure ulcer on her sacrum. I used forceps and scalpel to excise the leather-like eschar on the patient's spine. I also removed nonviable subcutaneous tissue. I used a curette to debride eschar from the other back wounds, as well as the sacral wound, gluteal wound, and upper thigh wound. I used forceps and scalpel to debride additional fibrin from the  trochanter wound. I used a curette to debride nonviable subcutaneous tissue. I then took a bone biopsy to evaluate for osteomyelitis and a bone culture to determine organisms and sensitivities. We will continue to apply Santyl to all of the wounds except for the trochanter. We will continue Dakin's dressing changes to the trochanter. The patient's caregiver reports that she is going to go to [employer] lobby to buy some foam to help her offload the patient's right side in her wheelchair. We will have them follow-up in 2 weeks, but when I get the culture data back, if intervention is required, I will initiate appropriate therapy. Electronic Signature(s) Signed: 05/31/2022 4:15:36 PM By: Duanne Guess MD FACS Entered By: Duanne Guess on 05/31/2022 16:15:35 -------------------------------------------------------------------------------- HxROS Details Patient Name: Date of Service: Nancy [name] [place]. 05/31/2022 2:45 PM Medical Record Number: [id number] Patient Account Number: [id number] Date of Birth/Sex: Treating RN: 08/31/1990 (30 y.o. F) Primary Care Provider: Lynnea Ferrier Other Clinician: Referring Provider: Treating Provider/Extender: Suzan Garibaldi in Treatment: 2 Information Obtained From Patient Caregiver Chart Eyes Medical History: Negative for: Cataracts; Glaucoma; Optic Neuritis Ear/Nose/Mouth/Throat Medical History: Negative for: Chronic sinus problems/congestion; Middle ear problems Cardiovascular Medical History: Positive for: Deep Vein Thrombosis - bil Endocrine Medical History: Negative for: Type I Diabetes; Type II Diabetes Genitourinary Medical History: Negative for: End Stage Renal Disease Past Medical History Notes: incontinence Integumentary (Skin) Medical History: Negative for: History of Burn Musculoskeletal Medical History: Past Medical History Notes: contractures, scoliosis Neurologic Medical History: Positive  for: Quadriplegia; Seizure Disorder - epilepsy Past Medical History Notes: cerebral palsy Oncologic Medical History: Negative for: Received Chemotherapy; Received Radiation Psychiatric Medical History: Positive  for: Anorexia/bulimia Negative for: Confinement Anxiety Past Medical History Notes: mental retardation Immunizations Pneumococcal Vaccine: Received Pneumococcal Vaccination: No Implantable Devices No devices added Hospitalization / Surgery History Type of Hospitalization/Surgery IUD insertion hip surgery right Family and Social History Cancer: Yes - Mother; Diabetes: Yes - Father; Heart Disease: Yes - Paternal Grandparents; Hereditary Spherocytosis: No; Hypertension: No; Kidney Disease: No; Lung Disease: No; Seizures: No; Stroke: No; Thyroid Problems: No; Tuberculosis: No; Never smoker; Marital Status - Single; Alcohol Use: Never; Drug Use: No History; Caffeine Use: Never; Financial Concerns: No; Food, Clothing or Shelter Needs: No; Support System Lacking: No; Transportation Concerns: Yes - Chief Strategy Officer) Signed: 05/31/2022 4:16:38 PM By: Fredirick Maudlin MD FACS Entered By: Fredirick Maudlin on 05/31/2022 16:11:51 -------------------------------------------------------------------------------- SuperBill Details Patient Name: Date of Service: Nancy [name], Nancy [name] 05/31/2022 Medical Record Number: [id number] Patient Account Number: [id number] Date of Birth/Sex: Treating RN: 05/21/1991 (31 y.o. F) Primary Care Provider: Jenna Luo Other Clinician: Referring Provider: Treating Provider/Extender: Mertha Finders in Treatment: 2 Diagnosis Coding ICD-10 Codes Code Description 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, stage 4 L89.100 Pressure ulcer of unspecified part of back, unstageable L89.313 Pressure ulcer of right buttock, stage 3 R63.0 Anorexia E43 Unspecified  severe protein-calorie malnutrition L89.152 Pressure ulcer of sacral region, stage 2 Facility Procedures CPT4 Code: 76160737 Description: 10626 - DEB SUBQ TISSUE 20 SQ CM/< ICD-10 Diagnosis Description L89.100 Pressure ulcer of unspecified part of back, unstageable Modifier: Quantity: 1 CPT4 Code: [id number] Description: 03500 - DEB SUBQ TISS EA ADDL 20CM ICD-10 Diagnosis Description L89.100 Pressure ulcer of unspecified part of back, unstageable Modifier: Quantity: 1 CPT4 Code: [id number] [id number] Description: 11044 - DEB BONE 20 SQ CM/< ICD-10 Diagnosis Description 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, stage 4 97597 - DEBRIDE WOUND 1ST 20 SQ CM OR < ICD-10 Diagnosis Description L89.152 Pressure ulcer of sacral region, stage 2 L89.313 Pressure  ulcer of right buttock, stage 3 Modifier: 1 Quantity: 1 Physician Procedures CPT4: Description Modifier Code [id number] 51025 - WC PHYS LEVEL 4 - EST PT 25 ICD-10 Diagnosis Description 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, stage 4 L89.100 Pressure ulcer of unspecified part of back, unstageable L89.313 Pressure ulcer of right buttock,  stage 3 L89.152 Pressure ulcer of sacral region, stage 2 Quantity: 1 CPT4: 8527782 11042 - WC PHYS SUBQ TISS 20 SQ CM ICD-10 Diagnosis Description L89.100 Pressure ulcer of unspecified part of back, unstageable Quantity: 1 CPT4: 4235361 11045 - WC PHYS SUBQ TISS EA ADDL 20 CM ICD-10 Diagnosis Description L89.100 Pressure ulcer of unspecified part of back, unstageable Quantity: 1 CPT4: 4431540 Debridement; bone (includes epidermis, dermis, subQ tissue, muscle and/or fascia, if performed) 1st 20 sqcm or less ICD-10 Diagnosis Description 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, stage 4 Quantity: 1 CPT4: 0867619 50932 - WC PHYS DEBR WO ANESTH 20 SQ CM ICD-10 Diagnosis Description L89.152 Pressure ulcer of sacral region, stage 2 L89.313 Pressure ulcer of right buttock, stage 3 Quantity: 1 Electronic Signature(s) Signed: 05/31/2022  4:16:19 PM By: Fredirick Maudlin MD FACS Entered By: Fredirick Maudlin on 05/31/2022 16:16:19

## 2022-06-04 DIAGNOSIS — E43 Unspecified severe protein-calorie malnutrition: Secondary | ICD-10-CM | POA: Diagnosis not present

## 2022-06-04 DIAGNOSIS — L89313 Pressure ulcer of right buttock, stage 3: Secondary | ICD-10-CM | POA: Diagnosis not present

## 2022-06-04 DIAGNOSIS — L89110 Pressure ulcer of right upper back, unstageable: Secondary | ICD-10-CM | POA: Diagnosis not present

## 2022-06-04 DIAGNOSIS — G40909 Epilepsy, unspecified, not intractable, without status epilepticus: Secondary | ICD-10-CM | POA: Diagnosis not present

## 2022-06-04 DIAGNOSIS — G800 Spastic quadriplegic cerebral palsy: Secondary | ICD-10-CM | POA: Diagnosis not present

## 2022-06-04 DIAGNOSIS — L89214 Pressure ulcer of right hip, stage 4: Secondary | ICD-10-CM | POA: Diagnosis not present

## 2022-06-05 ENCOUNTER — Telehealth: Payer: Self-pay

## 2022-06-05 ENCOUNTER — Ambulatory Visit (INDEPENDENT_AMBULATORY_CARE_PROVIDER_SITE_OTHER): Payer: Medicare Other | Admitting: Primary Care

## 2022-06-05 ENCOUNTER — Encounter (INDEPENDENT_AMBULATORY_CARE_PROVIDER_SITE_OTHER): Payer: Self-pay | Admitting: Primary Care

## 2022-06-05 VITALS — BP 122/79 | HR 100 | Resp 16

## 2022-06-05 DIAGNOSIS — G40309 Generalized idiopathic epilepsy and epileptic syndromes, not intractable, without status epilepticus: Secondary | ICD-10-CM | POA: Diagnosis not present

## 2022-06-05 DIAGNOSIS — R1310 Dysphagia, unspecified: Secondary | ICD-10-CM | POA: Diagnosis not present

## 2022-06-05 DIAGNOSIS — Z7689 Persons encountering health services in other specified circumstances: Secondary | ICD-10-CM | POA: Diagnosis not present

## 2022-06-05 DIAGNOSIS — F250 Schizoaffective disorder, bipolar type: Secondary | ICD-10-CM | POA: Diagnosis not present

## 2022-06-05 DIAGNOSIS — G894 Chronic pain syndrome: Secondary | ICD-10-CM

## 2022-06-05 DIAGNOSIS — I69320 Aphasia following cerebral infarction: Secondary | ICD-10-CM

## 2022-06-05 DIAGNOSIS — M170 Bilateral primary osteoarthritis of knee: Secondary | ICD-10-CM

## 2022-06-05 DIAGNOSIS — G800 Spastic quadriplegic cerebral palsy: Secondary | ICD-10-CM | POA: Diagnosis not present

## 2022-06-05 DIAGNOSIS — G8250 Quadriplegia, unspecified: Secondary | ICD-10-CM | POA: Diagnosis not present

## 2022-06-05 DIAGNOSIS — E440 Moderate protein-calorie malnutrition: Secondary | ICD-10-CM

## 2022-06-05 DIAGNOSIS — L89216 Pressure-induced deep tissue damage of right hip: Secondary | ICD-10-CM

## 2022-06-05 DIAGNOSIS — Z7409 Other reduced mobility: Secondary | ICD-10-CM | POA: Diagnosis not present

## 2022-06-05 DIAGNOSIS — I69359 Hemiplegia and hemiparesis following cerebral infarction affecting unspecified side: Secondary | ICD-10-CM

## 2022-06-05 DIAGNOSIS — I69391 Dysphagia following cerebral infarction: Secondary | ICD-10-CM | POA: Diagnosis not present
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
## 2022-06-05 NOTE — Telephone Encounter (Signed)
I spoke to patient's caregiver, [name], and her father when she was at her appointment today at [facility] with [name], NP.  [name] has been the patient's primary caregiver since 02/24/2022. The patient lives with her as part of the [facility]  program.   The patient currently receives home health services through [facility]. The nurse changes patient's wound care dressings three times a week  and Gloria changes the dressings the other four days and the patient is seen in the wound clinic weekly.  All of the care needs fall on Gloria. She explained that she has no respite at night. She is employed through [employer] on [address] and said she will speak to the care coordinator about the amount of care that the patient requires.  She would appreciate any additional help that can be provided.  I explained that we can submit a [facility] referral and they can assess patient for eligibility for personal care services. There is no guarantee that they will approve the referral.  [name] and the patient's father both approved of submitting the [facility] referral and her father agreed that [name] should be the contact person for [facility] evaluation . [name] [phone]

## 2022-06-05 NOTE — Progress Notes (Addendum)
Nancy, [name] ([id number]) [id number] Page 1 of 10 Visit Report for 05/31/2022 Arrival Information Details Patient Name: Date of Service: [name] [place] 05/31/2022 2:45 PM Medical Record Number: [id number] Patient Account Number: [id number] Date of Birth/Sex: Treating RN: Dec 12, 1990 (31 y.o. Nancy [name] Primary Care Nancy [name]: Nancy [name] Other Clinician: Referring Nancy [name]: Treating Nancy [name]/Extender: Nancy [name] in Treatment: 2 Visit Information History Since Last Visit Added or deleted any medications: No Patient Arrived: Wheel Chair Any new allergies or adverse reactions: No Arrival Time: 15:10 Had a fall or experienced change in No Accompanied By: caregiver activities of daily living that may affect Transfer Assistance: Michiel Sites Lift risk of falls: Patient Identification Verified: Yes Signs or symptoms of abuse/neglect since last visito No Secondary Verification Process Completed: Yes Hospitalized since last visit: No Patient Requires Transmission-Based Precautions: No Implantable device outside of the clinic excluding No Patient Has Alerts: No cellular tissue based products placed in the center since last visit: Has Dressing in Place as Prescribed: Yes Pain Present Now: Yes Electronic Signature(s) Signed: 05/31/2022 6:18:14 PM By: Nancy Deed RN, BSN Entered By: Nancy [name] on 05/31/2022 15:11:41 -------------------------------------------------------------------------------- Encounter Discharge Information Details Patient Name: Date of Service: [address] [place] 05/31/2022 2:45 PM Medical Record Number: [id number] Patient Account Number: [id number] Date of Birth/Sex: Treating RN: [birth year] (30 y.o. Nancy [name] Primary Care Nancy [name]: Nancy [name] Other Clinician: Referring Nancy [name]: Treating Nancy [name]/Extender: Nancy [name] in Treatment:  2 Encounter Discharge Information Items Post Procedure Vitals Discharge Condition: Stable Temperature (F): 97.8 Ambulatory Status: Wheelchair Pulse (bpm): 109 Discharge Destination: Home Respiratory Rate (breaths/min): 18 Transportation: Other Blood Pressure (mmHg): 85/49 Accompanied By: caregiver Schedule Follow-up Appointment: Yes Clinical Summary of Care: Patient Declined Electronic Signature(s) Signed: 05/31/2022 6:18:14 PM By: Nancy Deed RN, BSN Entered By: Nancy [name] on 05/31/2022 16:58:03 Nancy [name] ([id number]) [id number] Page 2 of 10 -------------------------------------------------------------------------------- Multi Wound Chart Details Patient Name: Date of Service: [name] [place] 05/31/2022 2:45 PM Medical Record Number: [id number] Patient Account Number: [id number] Date of Birth/Sex: Treating RN: 10/09/1990 (31 y.o. F) Primary Care Nancy [name]: Nancy [name] Other Clinician: Referring Maclean Foister: Treating Nancy [name]/Extender: Nancy [name] in Treatment: 2 Vital Signs Height(in): Pulse(bpm): 109 Weight(lbs): 70 Blood Pressure(mmHg): 85/49 Body Mass Index(BMI): Temperature(F): 97.8 Respiratory Rate(breaths/min): 18 [1:Photos:] Right Trochanter Right Back Right Gluteus Wound Location: Pressure Injury Pressure Injury Pressure Injury Wounding Event: Pressure Ulcer Pressure Ulcer Pressure Ulcer Primary Etiology: Deep Vein Thrombosis, Quadriplegia, Deep Vein Thrombosis, Quadriplegia, Deep Vein Thrombosis, Quadriplegia, Comorbid History: Seizure Disorder, Anorexia/bulimia Seizure Disorder, Anorexia/bulimia Seizure Disorder, Anorexia/bulimia 01/25/2022 01/25/2022 01/25/2022 Date Acquired: 2 2 2  Weeks of Treatment: Open Open Open Wound Status: No No No Wound Recurrence: Yes Yes No Clustered Wound: N/A 4 N/A Clustered Quantity: 5.7x5.1x1.2 9x8.5x0.4 0.8x3x0.1 Measurements L x W x D  (cm) 22.832 60.083 1.885 A (cm) : rea 27.398 24.033 0.188 Volume (cm) : 36.50% -36.60% 86.60% % Reduction in A rea: 15.30% -446.50% 86.60% % Reduction in Volume: 4 Starting Position 1 (o'clock): 12 Ending Position 1 (o'clock): 1.6 Maximum Distance 1 (cm): Yes No N/A Undermining: Category/Stage IV Unstageable/Unclassified Category/Stage III Classification: Large Medium Medium Exudate A mount: Serosanguineous Serous Serosanguineous Exudate Type: red, brown amber red, brown Exudate Color: Well defined, not attached Flat and Intact Flat and Intact Wound Margin: Medium (34-66%) Small (1-33%) Medium (34-66%) Granulation A mount: Red Red Red, Pink Granulation Quality: Medium (34-66%) Large (67-100%) Medium (34-66%) Necrotic A mount: Adherent  Nancy [name], [place] Necrotic Tissue: Fat Layer (Subcutaneous Tissue): Yes Fat Layer (Subcutaneous Tissue): Yes Fat Layer (Subcutaneous Tissue): Yes Exposed Structures: Muscle: Yes Fascia: No Fascia: No Bone: Yes Tendon: No Tendon: No Fascia: No Muscle: No Muscle: No Tendon: No Joint: No Joint: No Joint: No Bone: No Bone: No None Small (1-33%) Small (1-33%) Epithelialization: Debridement - Excisional Debridement - Excisional Debridement - Selective/Open Wound Debridement: Pre-procedure Verification/Time Out 15:40 15:40 15:40 Taken: Lidocaine 4% Topical Solution Lidocaine 4% Topical Solution Lidocaine 4% Topical Solution Pain Control: Bone, Psychologist, prison and probation services, Subcutaneous Slough Tissue Debrided: Skin/Subcutaneous Skin/Subcutaneous Tissue Non-Viable Tissue Level: Tissue/Muscle/Bone 4 36 2.4 Debridement A (sq cm): rea Blade, Forceps, Rongeur Blade, Curette, Forceps Curette Instrument: Minimum Minimum Minimum Bleeding: Pressure Pressure Pressure Hemostasis Achieved: Nancy, [name] ([id number]) [id number] Page 3 of 10 2 2 2  Procedural Pain: 1 1  1  Post Procedural Pain: Procedure was tolerated well Procedure was tolerated well Procedure was tolerated well Debridement Treatment Response: 5.7x5.1x1.2 9x8.5x0.4 0.8x3x0.1 Post Debridement Measurements L x W x D (cm) 27.398 24.033 0.188 Post Debridement Volume: (cm) Category/Stage IV Unstageable/Unclassified Category/Stage III Post Debridement Stage: No Abnormalities Noted No Abnormalities Noted No Abnormalities Noted Periwound Skin Texture: No Abnormalities Noted No Abnormalities Noted No Abnormalities Noted Periwound Skin Moisture: No Abnormalities Noted No Abnormalities Noted No Abnormalities Noted Periwound Skin Color: No Abnormality No Abnormality No Abnormality Temperature: Debridement Debridement Debridement Procedures Performed: Wound Number: 4 5 N/A Photos: No Photos N/A Right, Lateral Upper Leg Sacrum N/A Wound Location: Pressure Injury Pressure Injury N/A Wounding Event: Pressure Ulcer Pressure Ulcer N/A Primary Etiology: Deep Vein Thrombosis, Quadriplegia, Deep Vein Thrombosis, Quadriplegia, N/A Comorbid History: Seizure Disorder, Anorexia/bulimia Seizure Disorder, Anorexia/bulimia 05/21/2022 05/31/2022 N/A Date Acquired: 1 0 N/A Weeks of Treatment: Open Open N/A Wound Status: No No N/A Wound Recurrence: No No N/A Clustered Wound: N/A N/A N/A Clustered Quantity: 1x0.8x0.1 0.5x0.5x0.1 N/A Measurements L x W x D (cm) 0.628 0.196 N/A A (cm) : rea 0.063 0.02 N/A Volume (cm) : 80.40% N/A N/A % Reduction in A rea: 80.30% N/A N/A % Reduction in Volume: No No N/A Undermining: Category/Stage III Category/Stage III N/A Classification: Small Small N/A Exudate A mount: Serosanguineous Serous N/A Exudate Type: red, brown amber N/A Exudate Color: Flat and Intact Flat and Intact N/A Wound Margin: Small (1-33%) None Present (0%) N/A Granulation A mount: Red N/A N/A Granulation Quality: Large (67-100%) Large (67-100%) N/A Necrotic A  mount: Eschar, Adherent Slough Adherent Slough N/A Necrotic Tissue: Fat Layer (Subcutaneous Tissue): Yes Fat Layer (Subcutaneous Tissue): Yes N/A Exposed Structures: Fascia: No Fascia: No Tendon: No Tendon: No Muscle: No Muscle: No Joint: No Joint: No Bone: No Bone: No Medium (34-66%) Small (1-33%) N/A Epithelialization: Debridement - Selective/Open Wound Debridement - Selective/Open Wound N/A Debridement: Pre-procedure Verification/Time Out 15:40 15:40 N/A Taken: Lidocaine 4% Topical Solution Lidocaine 4% Topical Solution N/A Pain Control: Necrotic/Eschar, Psychologist, prison and probation services, Slough N/A Tissue Debrided: Non-Viable Tissue Non-Viable Tissue N/A Level: 0.8 0.25 N/A Debridement A (sq cm): rea Curette Curette N/A Instrument: None None N/A Bleeding: N/A N/A N/A Hemostasis A chieved: 2 2 N/A Procedural Pain: 1 1 N/A Post Procedural Pain: Procedure was tolerated well Procedure was tolerated well N/A Debridement Treatment Response: 1x0.8x0.1 0.5x0.5x0.1 N/A Post Debridement Measurements L x W x D (cm) 0.063 0.02 N/A Post Debridement Volume: (cm) Category/Stage III Category/Stage III N/A Post Debridement Stage: No Abnormalities Noted No Abnormalities Noted N/A Periwound Skin Texture: No Abnormalities Noted No Abnormalities Noted N/A Periwound Skin Moisture: No  Abnormalities Noted No Abnormalities Noted N/A Periwound Skin Color: No Abnormality No Abnormality N/A Temperature: Debridement Debridement N/A Procedures Performed: Treatment Notes Electronic Signature(s) Nancy, [name] ([id number]) [id number] Page 4 of 10 Signed: 05/31/2022 4:09:48 PM By: Fredirick Maudlin MD FACS Entered By: Fredirick Maudlin on 05/31/2022 16:09:48 -------------------------------------------------------------------------------- Multi-Disciplinary Care Plan Details Patient Name: Date of Service: [address] [place]. 05/31/2022 2:45 PM Medical  Record Number: [id number] Patient Account Number: [id number] Date of Birth/Sex: Treating RN: Sep 17, 1990 (31 y.o. Nancy [name] Primary Care Ramiah Helfrich: Jenna Luo Other Clinician: Referring Jessa Stinson: Treating Ashaki Frosch/Extender: Mertha Finders in Treatment: 2 [place] reviewed with physician Active Inactive Electronic Signature(s) Signed: 06/13/2022 5:57:57 PM By: Baruch Gouty RN, BSN Previous Signature: 05/31/2022 6:18:14 PM Version By: Baruch Gouty RN, BSN Entered By: Baruch Gouty on 06/04/2022 16:39:28 -------------------------------------------------------------------------------- Pain Assessment Details Patient Name: Date of Service: Nancy [name] [place]. 05/31/2022 2:45 PM Medical Record Number: [id number] Patient Account Number: [id number] Date of Birth/Sex: Treating RN: 05/27/1991 (31 y.o. Nancy [name] Primary Care Yaileen Hofferber: Jenna Luo Other Clinician: Referring Zabian Swayne: Treating Carleena Mires/Extender: Mertha Finders in Treatment: 2 Active Problems Location of Pain Severity and Description of Pain Patient Has Paino Yes Site Locations Pain Location: Pain in Ulcers With Dressing Change: Yes Duration of the Pain. Constant / Intermittento Intermittent Rate the pain. Current Pain Level: 3 Worst Pain Level: 8 Least Pain Level: 0 Character of Pain Describe the Pain: Aching Pain Management and Medication Current Pain Management: KAYELANI, KAFKA ([id number]) [id number] Page 5 of 10 Medication: Yes Is the Current Pain Management Adequate: Adequate How does your wound impact your activities of daily livingo Sleep: No Bathing: No Appetite: No Relationship With Others: No Bladder Continence: No Emotions: No Bowel Continence: No Work: No Toileting: No Drive: No Dressing: No Hobbies: No Electronic Signature(s) Signed: 05/31/2022 6:18:14 PM By:  Baruch Gouty RN, BSN Entered By: Baruch Gouty on 05/31/2022 15:54:19 -------------------------------------------------------------------------------- Patient/Caregiver Education Details Patient Name: Date of Service: [place] 10/5/2023andnbsp2:45 PM Medical Record Number: [id number] Patient Account Number: [id number] Date of Birth/Gender: Treating RN: November 08, 1990 (31 y.o. Nancy [name] Primary Care Physician: Jenna Luo Other Clinician: Referring Physician: Treating Physician/Extender: Mertha Finders in Treatment: 2 Education Assessment Education Provided To: Patient Education Topics Provided Pressure: Methods: Explain/Verbal Responses: Reinforcements needed, State content correctly Wound/Skin Impairment: Methods: Explain/Verbal Responses: Reinforcements needed, State content correctly Electronic Signature(s) Signed: 05/31/2022 6:18:14 PM By: Baruch Gouty RN, BSN Entered By: Baruch Gouty on 05/31/2022 15:36:21 -------------------------------------------------------------------------------- Wound Assessment Details Patient Name: Date of Service: Nancy [name] [place]. 05/31/2022 2:45 PM Medical Record Number: [id number] Patient Account Number: [id number] Date of Birth/Sex: Treating RN: 1990-09-07 (31 y.o. Nancy [name] Primary Care Yordan Martindale: Jenna Luo Other Clinician: Referring Kirrah Mustin: Treating Agripina Guyette/Extender: Mertha Finders in Treatment: 2 Wound Status Wound Number: 1 Primary Pressure Ulcer Etiology: Wound Location: Right Trochanter Wound Status: Open Wounding Event: Pressure Injury FADUMA, EIDE ([id number]) [id number] Page 6 of 10 Comorbid Deep Vein Thrombosis, Quadriplegia, Seizure Disorder, Date Acquired: 01/25/2022 History: Anorexia/bulimia Weeks Of Treatment: 2 Clustered Wound: Yes Photos Wound Measurements Length: (cm) 5.7 Width:  (cm) 5.1 Depth: (cm) 1.2 Area: (cm) 22.832 Volume: (cm) 27.398 % Reduction in Area: 36.5% % Reduction in Volume: 15.3% Epithelialization: None Undermining: Yes Starting Position (o'clock): 4 Ending Position (o'clock): 12 Maximum Distance: (cm) 1.6 Wound Description Classification: Category/Stage IV Wound Margin: Well defined, not attached Exudate Amount:  Large Exudate Type: Serosanguineous Exudate Color: red, brown Foul Odor After Cleansing: No Slough/Fibrino Yes Wound Bed Granulation Amount: Medium (34-66%) Exposed Structure Granulation Quality: Red Fascia Exposed: No Necrotic Amount: Medium (34-66%) Fat Layer (Subcutaneous Tissue) Exposed: Yes Necrotic Quality: Adherent Slough Tendon Exposed: No Muscle Exposed: Yes Necrosis of Muscle: Yes Joint Exposed: No Bone Exposed: Yes Periwound Skin Texture Texture Color No Abnormalities Noted: Yes No Abnormalities Noted: Yes Moisture Temperature / Pain No Abnormalities Noted: Yes Temperature: No Abnormality Electronic Signature(s) Signed: 06/05/2022 3:12:14 PM By: Sharyn Creamer RN, BSN Entered By: Sharyn Creamer on 05/31/2022 15:32:31 -------------------------------------------------------------------------------- Wound Assessment Details Patient Name: Date of Service: Nancy [name] [place]. 05/31/2022 2:45 PM Medical Record Number: [id number] Patient Account Number: [id number] Date of Birth/Sex: Treating RN: 16-Apr-1991 (31 y.o. Nancy [name] Primary Care Iva Posten: Jenna Luo Other Clinician: Referring Caitlen Worth: Treating Shayana Hornstein/Extender: Mertha Finders in Treatment: 2 Wound Status Nancy, [name] ([id number]) [id number] Page 7 of 10 Wound Number: 2 Primary Pressure Ulcer Etiology: Wound Location: Right Back Wound Status: Open Wounding Event: Pressure Injury Comorbid Deep Vein Thrombosis, Quadriplegia, Seizure Disorder, Date Acquired:  01/25/2022 History: Anorexia/bulimia Weeks Of Treatment: 2 Clustered Wound: Yes Photos Wound Measurements Length: (cm) Width: (cm) Depth: (cm) Clustered Quantity: Area: (cm) Volume: (cm) 9 % Reduction in Area: -36.6% 8.5 % Reduction in Volume: -446.5% 0.4 Epithelialization: Small (1-33%) 4 Tunneling: No 60.083 Undermining: No 24.033 Wound Description Classification: Unstageable/Unclassified Wound Margin: Flat and Intact Exudate Amount: Medium Exudate Type: Serous Exudate Color: amber Foul Odor After Cleansing: No Slough/Fibrino Yes Wound Bed Granulation Amount: Small (1-33%) Exposed Structure Granulation Quality: Red Fascia Exposed: No Necrotic Amount: Large (67-100%) Fat Layer (Subcutaneous Tissue) Exposed: Yes Necrotic Quality: Eschar, Adherent Slough Tendon Exposed: No Muscle Exposed: No Joint Exposed: No Bone Exposed: No Periwound Skin Texture Texture Color No Abnormalities Noted: Yes No Abnormalities Noted: Yes Moisture Temperature / Pain No Abnormalities Noted: Yes Temperature: No Abnormality Electronic Signature(s) Signed: 06/05/2022 3:12:14 PM By: Sharyn Creamer RN, BSN Entered By: Sharyn Creamer on 05/31/2022 15:27:04 -------------------------------------------------------------------------------- Wound Assessment Details Patient Name: Date of Service: [place], [place] 05/31/2022 2:45 PM Medical Record Number: [id number] Patient Account Number: [id number] Date of Birth/Sex: Treating RN: 23-Oct-1990 (31 y.o. Nancy [name] Primary Care Dmarcus Decicco: Jenna Luo Other Clinician: Referring Lizabeth Fellner: Treating Lynnetta Tom/Extender: Mertha Finders in Treatment: 2 Wound Status Nancy, [name] ([id number]) [id number] Page 8 of 10 Wound Number: 3 Primary Pressure Ulcer Etiology: Wound Location: Right Gluteus Wound Status: Open Wounding Event: Pressure Injury Comorbid Deep Vein Thrombosis,  Quadriplegia, Seizure Disorder, Date Acquired: 01/25/2022 History: Anorexia/bulimia Weeks Of Treatment: 2 Clustered Wound: No Photos Wound Measurements Length: (cm) 0.8 Width: (cm) 3 Depth: (cm) 0.1 Area: (cm) 1.885 Volume: (cm) 0.188 % Reduction in Area: 86.6% % Reduction in Volume: 86.6% Epithelialization: Small (1-33%) Wound Description Classification: Category/Stage III Wound Margin: Flat and Intact Exudate Amount: Medium Exudate Type: Serosanguineous Exudate Color: red, brown Foul Odor After Cleansing: No Slough/Fibrino Yes Wound Bed Granulation Amount: Medium (34-66%) Exposed Structure Granulation Quality: Red, Pink Fascia Exposed: No Necrotic Amount: Medium (34-66%) Fat Layer (Subcutaneous Tissue) Exposed: Yes Necrotic Quality: Adherent Slough Tendon Exposed: No Muscle Exposed: No Joint Exposed: No Bone Exposed: No Periwound Skin Texture Texture Color No Abnormalities Noted: Yes No Abnormalities Noted: Yes Moisture Temperature / Pain No Abnormalities Noted: Yes Temperature: No Abnormality Electronic Signature(s) Signed: 06/05/2022 3:12:14 PM By: Sharyn Creamer RN, BSN Entered By: Sharyn Creamer on 05/31/2022 15:29:49 -------------------------------------------------------------------------------- Wound  Assessment Details Patient Name: Date of Service: [place], [place] 05/31/2022 2:45 PM Medical Record Number: [id number] Patient Account Number: [id number] Date of Birth/Sex: Treating RN: Nov 16, 1990 (31 y.o. Nancy [name] Primary Care Fey Coghill: Jenna Luo Other Clinician: Referring Letasha Kershaw: Treating Solan Vosler/Extender: Mertha Finders in Treatment: 2 Wound Status Nancy, [name] ([id number]) [id number] Page 9 of 10 Wound Number: 4 Primary Pressure Ulcer Etiology: Wound Location: Right, Lateral Upper Leg Wound Status: Open Wounding Event: Pressure Injury Comorbid Deep Vein  Thrombosis, Quadriplegia, Seizure Disorder, Date Acquired: 05/21/2022 History: Anorexia/bulimia Weeks Of Treatment: 1 Clustered Wound: No Photos Wound Measurements Length: (cm) 1 Width: (cm) 0.8 Depth: (cm) 0.1 Area: (cm) 0.628 Volume: (cm) 0.063 % Reduction in Area: 80.4% % Reduction in Volume: 80.3% Epithelialization: Medium (34-66%) Tunneling: No Undermining: No Wound Description Classification: Category/Stage III Wound Margin: Flat and Intact Exudate Amount: Small Exudate Type: Serosanguineous Exudate Color: red, brown Foul Odor After Cleansing: No Slough/Fibrino No Wound Bed Granulation Amount: Small (1-33%) Exposed Structure Granulation Quality: Red Fascia Exposed: No Necrotic Amount: Large (67-100%) Fat Layer (Subcutaneous Tissue) Exposed: Yes Necrotic Quality: Eschar, Adherent Slough Tendon Exposed: No Muscle Exposed: No Joint Exposed: No Bone Exposed: No Periwound Skin Texture Texture Color No Abnormalities Noted: Yes No Abnormalities Noted: Yes Moisture Temperature / Pain No Abnormalities Noted: Yes Temperature: No Abnormality Electronic Signature(s) Signed: 06/05/2022 3:12:14 PM By: Sharyn Creamer RN, BSN Entered By: Sharyn Creamer on 05/31/2022 15:28:52 -------------------------------------------------------------------------------- Wound Assessment Details Patient Name: Date of Service: [place], [place] 05/31/2022 2:45 PM Medical Record Number: [id number] Patient Account Number: [id number] Date of Birth/Sex: Treating RN: Jan 14, 1991 (31 y.o. Nancy [name] Primary Care Arlee Santosuosso: Jenna Luo Other Clinician: Referring Bralin Garry: Treating Gearald Stonebraker/Extender: Mertha Finders in Treatment: 2 Wound Status Wound Number: 5 Primary Pressure Ulcer Nancy, [name] ([id number]) [id number] Page 10 of 10 Etiology: Wound Location: Sacrum Wound Status: Open Wounding Event: Pressure  Injury Comorbid Deep Vein Thrombosis, Quadriplegia, Seizure Disorder, Date Acquired: 05/31/2022 History: Anorexia/bulimia Weeks Of Treatment: 0 Clustered Wound: No Wound Measurements Length: (cm) 0.5 Width: (cm) 0.5 Depth: (cm) 0.1 Area: (cm) 0.196 Volume: (cm) 0.02 % Reduction in Area: % Reduction in Volume: Epithelialization: Small (1-33%) Tunneling: No Undermining: No Wound Description Classification: Category/Stage III Wound Margin: Flat and Intact Exudate Amount: Small Exudate Type: Serous Exudate Color: amber Foul Odor After Cleansing: No Slough/Fibrino Yes Wound Bed Granulation Amount: None Present (0%) Exposed Structure Necrotic Amount: Large (67-100%) Fascia Exposed: No Necrotic Quality: Adherent Slough Fat Layer (Subcutaneous Tissue) Exposed: Yes Tendon Exposed: No Muscle Exposed: No Joint Exposed: No Bone Exposed: No Periwound Skin Texture Texture Color No Abnormalities Noted: Yes No Abnormalities Noted: Yes Moisture Temperature / Pain No Abnormalities Noted: Yes Temperature: No Abnormality Electronic Signature(s) Signed: 05/31/2022 6:18:14 PM By: Baruch Gouty RN, BSN Entered By: Baruch Gouty on 05/31/2022 15:56:35 -------------------------------------------------------------------------------- [place] Details Patient Name: Date of Service: Nancy [name] [place]. 05/31/2022 2:45 PM Medical Record Number: [id number] Patient Account Number: [id number] Date of Birth/Sex: Treating RN: Jan 12, 1991 (31 y.o. Nancy [name], [place] Primary Care Treven Holtman: Jenna Luo Other Clinician: Referring Spencer Peterkin: Treating Aicia Babinski/Extender: Mertha Finders in Treatment: 2 Vital Signs Time Taken: 15:11 Temperature (F): 97.8 Weight (lbs): 70 Pulse (bpm): 109 Respiratory Rate (breaths/min): 18 Blood Pressure (mmHg): 85/49 Reference Range: 80 - 120 mg / dl Electronic Signature(s) Signed: 05/31/2022 6:18:14 PM By: Baruch Gouty RN,  BSN Entered By: Baruch Gouty on 05/31/2022 15:12:11

## 2022-06-06 ENCOUNTER — Telehealth (INDEPENDENT_AMBULATORY_CARE_PROVIDER_SITE_OTHER): Payer: Self-pay | Admitting: Primary Care

## 2022-06-06 ENCOUNTER — Other Ambulatory Visit: Payer: Self-pay | Admitting: Physical Medicine & Rehabilitation

## 2022-06-06 ENCOUNTER — Telehealth (INDEPENDENT_AMBULATORY_CARE_PROVIDER_SITE_OTHER): Payer: Self-pay | Admitting: *Deleted

## 2022-06-06 ENCOUNTER — Other Ambulatory Visit: Payer: Self-pay | Admitting: Family Medicine

## 2022-06-06 DIAGNOSIS — L89214 Pressure ulcer of right hip, stage 4: Secondary | ICD-10-CM | POA: Diagnosis not present

## 2022-06-06 DIAGNOSIS — E43 Unspecified severe protein-calorie malnutrition: Secondary | ICD-10-CM | POA: Diagnosis not present

## 2022-06-06 DIAGNOSIS — F250 Schizoaffective disorder, bipolar type: Secondary | ICD-10-CM | POA: Insufficient documentation

## 2022-06-06 DIAGNOSIS — G40909 Epilepsy, unspecified, not intractable, without status epilepticus: Secondary | ICD-10-CM | POA: Diagnosis not present

## 2022-06-06 DIAGNOSIS — G8250 Quadriplegia, unspecified: Secondary | ICD-10-CM | POA: Insufficient documentation

## 2022-06-06 DIAGNOSIS — G800 Spastic quadriplegic cerebral palsy: Secondary | ICD-10-CM | POA: Diagnosis not present

## 2022-06-06 DIAGNOSIS — L89313 Pressure ulcer of right buttock, stage 3: Secondary | ICD-10-CM | POA: Diagnosis not present

## 2022-06-06 DIAGNOSIS — L89110 Pressure ulcer of right upper back, unstageable: Secondary | ICD-10-CM | POA: Diagnosis not present

## 2022-06-06 MED ORDER — DICLOFENAC SODIUM 1 % EX GEL: CUTANEOUS | 0 refills | Status: AC
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
## 2022-06-06 NOTE — Telephone Encounter (Signed)
'  Nancy [name]' pt's caregiver calling. States pain medication was discussed at [facility] yesterday. States pt yells out with pain from pressure wounds. Advised by wound clinic pt would need more than IBU 800mg  she is currently on and this was discussed at [facility]. States nothing has been called in as of yet.  Please advise.[phone]

## 2022-06-06 NOTE — Telephone Encounter (Signed)
Home Health Verbal Orders - Caller/Agency: [facility] Number: [phone] Requesting OT/PT/Skilled Nursing/Social Work/Speech Therapy:  Frequency:   Wants an order for ST swallow test

## 2022-06-06 NOTE — Telephone Encounter (Signed)
Will forward to provider  

## 2022-06-07 ENCOUNTER — Ambulatory Visit (INDEPENDENT_AMBULATORY_CARE_PROVIDER_SITE_OTHER): Payer: Medicare Other | Admitting: Internal Medicine

## 2022-06-07 ENCOUNTER — Other Ambulatory Visit: Payer: Self-pay

## 2022-06-07 DIAGNOSIS — L89159 Pressure ulcer of sacral region, unspecified stage: Secondary | ICD-10-CM

## 2022-06-07 DIAGNOSIS — G8250 Quadriplegia, unspecified: Secondary | ICD-10-CM | POA: Diagnosis not present

## 2022-06-07 DIAGNOSIS — M8660 Other chronic osteomyelitis, unspecified site: Secondary | ICD-10-CM | POA: Diagnosis not present

## 2022-06-07 NOTE — Telephone Encounter (Signed)
Pt is deceased. Will not be able to give verbal orders

## 2022-06-07 NOTE — Progress Notes (Signed)
f °

## 2022-06-07 NOTE — Progress Notes (Signed)
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   Schizoaffective disorder, bipolar type ([place]) 06/06/2022   SIRS (systemic inflammatory response syndrome) ([place]) 08/09/2016   Nausea vomiting and diarrhea 08/09/2016   Fecal impaction ([place]) 08/09/2016   Psychosis ([place]) 11/23/2013   Paranoia ([place]) 11/22/2013   Fall from wheelchair 05/29/2013   Anemia    CP (cerebral palsy), spastic, quadriplegic ([place]) 12/23/2012   Scoliosis associated with other condition 12/23/2012   Generalized convulsive epilepsy without mention of intractable epilepsy 12/23/2012   Myoclonus 12/23/2012   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  Chart reviewed  She is accompanied by her caregiver  As my nursing staffs were attempting to take vitals, they were concerned patient was dead and asked me to examine which I did and confirm patient was dead  Review of Systems: ROS  Unable to obtain as patient was declared dead on arrival     Past Medical History:  Diagnosis Date   Anemia    h/o iron deficiency   CP (cerebral palsy) ([place])    with mild contracture   IUD (intrauterine device) in place 10/2012   Menorrhagia with regular cycle 11/11/2012   Mobility impaired 11/11/2012   Mood disorder ([place])    MR (mental retardation)    Peripheral vascular disease ([place]) 10/02/2006   bilateral DVT lower legs   Seizures ([place])    childhood   Wheelchair bound    r/t CP    Social History   Tobacco Use   Smoking status: Never   Smokeless tobacco:  Never  Vaping Use   Vaping Use: Never used  Substance Use Topics   Alcohol use: No    Comment: Pt denies   Drug use: No    Comment: Pt denies    Family History  Problem Relation Age of Onset   Cancer Mother    Diabetes Father    Alzheimer's disease Maternal Grandfather    Heart attack Paternal Grandmother        Died between 27 or 46 years old    No Known Allergies  OBJECTIVE: There were no vitals filed for this visit. There is no height or weight on file to calculate BMI.   Physical Exam Patient spastic, cold to touch Neurological exam: no movement of extremities; no response to noxious stimulit; no pulse, heart beat, or breaths; no brainstem reflex (pupil/papebral) and pupils were dilated fixed at midline  She was declared dead at 915 by me and Lucie Leather one of our RN's   Lab:  Microbiology:  Serology:  Imaging:   Assessment/plan: Problem List Items Addressed This Visit   None Visit Diagnoses     Death    -  Primary   Chronic osteomyelitis ([place])       Quadriplegia ([place])       Pressure injury of skin of sacral region, unspecified injury stage             31 yo female with cerebral palsy/quadriplegic referred from wound clinic for sacral decub and associated chronic right femur/trochanter osteomyelitis  There is recent wound cx "right trochanter bone" from wound clinic 10/08 understandably with multiple bacteria, bacteroides, staph aureus, corynebacterium striatum/species, pseudomonas. There is no meaningful culture sensitivity testing   However, patient appeared and was confirmed/declared dead. There was no heart beat, breathing, or brainstem reflex. Her pupils were fixed midline and dilated.   The caregiver accompanied her reported she has been moaning/appearring in distress the last few days, but since this morning there has been no movement/communication and that the patients appear to be just staring in space  We are attempting to see if we  need to bring patient to hospital to officially declare death or the funeral home        Follow-up: No follow-ups on file.  Jabier Mutton, [facility] 06/11/2022, 9:14 AM

## 2022-06-08 ENCOUNTER — Telehealth (INDEPENDENT_AMBULATORY_CARE_PROVIDER_SITE_OTHER): Payer: Self-pay | Admitting: Primary Care

## 2022-06-08 NOTE — Telephone Encounter (Signed)
Copied from [place] [id number]. Topic: General - Inquiry >> Jun 08, 2022  3:47 PM Nancy [name] wrote: Reason for CBU:[id number] [place] pt's caregiver called, requesting to speak with [name]. In regards to "what happened to pt yesterday, she stated"  [name] is requesting a call back.

## 2022-06-10 ENCOUNTER — Encounter (INDEPENDENT_AMBULATORY_CARE_PROVIDER_SITE_OTHER): Payer: Self-pay | Admitting: Primary Care

## 2022-06-11 NOTE — Telephone Encounter (Signed)
Caller called back saying she has not heard back from anyone .  She states that patient passed away and she wanted to speak with Nancy [name].

## 2022-06-14 ENCOUNTER — Ambulatory Visit (HOSPITAL_BASED_OUTPATIENT_CLINIC_OR_DEPARTMENT_OTHER): Payer: Medicare Other | Admitting: Internal Medicine

## 2022-06-27 DIAGNOSIS — 419620001 Death: Secondary | SNOMED CT | POA: Diagnosis not present

## 2022-06-27 NOTE — Progress Notes (Signed)
error 

## 2022-06-27 DEATH — deceased

## 2022-08-06 ENCOUNTER — Telehealth (INDEPENDENT_AMBULATORY_CARE_PROVIDER_SITE_OTHER): Payer: Medicare Other | Admitting: Primary Care
